# Patient Record
Sex: Male | Born: 1955 | Race: White | Hispanic: No | Marital: Married | Smoking: Never smoker
Health system: Southern US, Community
[De-identification: ages and names within clinical notes are randomized; demographics above are authoritative.]

## PROBLEM LIST (undated history)

## (undated) DIAGNOSIS — C801 Malignant (primary) neoplasm, unspecified: Secondary | ICD-10-CM

## (undated) DIAGNOSIS — Z87442 Personal history of urinary calculi: Secondary | ICD-10-CM

## (undated) DIAGNOSIS — I2510 Atherosclerotic heart disease of native coronary artery without angina pectoris: Secondary | ICD-10-CM

## (undated) DIAGNOSIS — E785 Hyperlipidemia, unspecified: Secondary | ICD-10-CM

## (undated) DIAGNOSIS — I10 Essential (primary) hypertension: Secondary | ICD-10-CM

## (undated) DIAGNOSIS — C61 Malignant neoplasm of prostate: Secondary | ICD-10-CM

## (undated) HISTORY — DX: Malignant (primary) neoplasm, unspecified: C80.1

## (undated) HISTORY — DX: Atherosclerotic heart disease of native coronary artery without angina pectoris: I25.10

## (undated) HISTORY — PX: COLONOSCOPY: SHX174

## (undated) HISTORY — DX: Hyperlipidemia, unspecified: E78.5

## (undated) HISTORY — PX: PROSTATE BIOPSY: SHX241

## (undated) HISTORY — DX: Personal history of urinary calculi: Z87.442

## (undated) HISTORY — PX: OTHER SURGICAL HISTORY: SHX169

---

## 2007-01-07 ENCOUNTER — Encounter: Payer: Self-pay | Admitting: Internal Medicine

## 2008-02-21 ENCOUNTER — Ambulatory Visit: Payer: Self-pay | Admitting: Internal Medicine

## 2008-02-21 LAB — CONVERTED CEMR LAB
ALT: 42 units/L (ref 0–53)
AST: 25 units/L (ref 0–37)
Albumin: 3.9 g/dL (ref 3.5–5.2)
Alkaline Phosphatase: 40 units/L (ref 39–117)
BUN: 14 mg/dL (ref 6–23)
Basophils Absolute: 0 10*3/uL (ref 0.0–0.1)
Bilirubin Urine: NEGATIVE
Bilirubin, Direct: 0.1 mg/dL (ref 0.0–0.3)
Blood in Urine, dipstick: NEGATIVE
Calcium: 9.7 mg/dL (ref 8.4–10.5)
Chloride: 107 meq/L (ref 96–112)
Cholesterol: 221 mg/dL (ref 0–200)
Eosinophils Absolute: 0 10*3/uL (ref 0.0–0.6)
Eosinophils Relative: 1.1 % (ref 0.0–5.0)
GFR calc Af Amer: 101 mL/min
GFR calc non Af Amer: 84 mL/min
Glucose, Bld: 103 mg/dL — ABNORMAL HIGH (ref 70–99)
HDL: 42.2 mg/dL (ref >39.0)
Ketones, urine, test strip: NEGATIVE
Lymphocytes Relative: 24.3 % (ref 12.0–46.0)
MCHC: 33.9 g/dL (ref 30.0–36.0)
MCV: 91.7 fL (ref 78.0–100.0)
Monocytes Relative: 7.3 % (ref 3.0–11.0)
Neutro Abs: 3 10*3/uL (ref 1.4–7.7)
Nitrite: NEGATIVE
Platelets: 198 10*3/uL (ref 150–400)
RBC: 4.7 M/uL (ref 4.22–5.81)
Specific Gravity, Urine: 1.02
TSH: 2.52 microintl units/mL (ref 0.35–5.50)
Triglycerides: 59 mg/dL (ref 0–149)
Urobilinogen, UA: 0.2
WBC: 4.3 10*3/uL — ABNORMAL LOW (ref 4.5–10.5)

## 2008-02-28 ENCOUNTER — Ambulatory Visit: Payer: Self-pay | Admitting: Internal Medicine

## 2008-02-28 DIAGNOSIS — Z87442 Personal history of urinary calculi: Secondary | ICD-10-CM | POA: Insufficient documentation

## 2008-02-28 DIAGNOSIS — E785 Hyperlipidemia, unspecified: Secondary | ICD-10-CM | POA: Insufficient documentation

## 2008-02-28 HISTORY — DX: Personal history of urinary calculi: Z87.442

## 2008-03-08 ENCOUNTER — Ambulatory Visit: Payer: Self-pay | Admitting: Gastroenterology

## 2008-03-11 ENCOUNTER — Encounter: Payer: Self-pay | Admitting: Internal Medicine

## 2008-03-22 ENCOUNTER — Encounter: Payer: Self-pay | Admitting: Gastroenterology

## 2008-03-22 ENCOUNTER — Ambulatory Visit: Payer: Self-pay | Admitting: Gastroenterology

## 2008-03-22 ENCOUNTER — Encounter: Payer: Self-pay | Admitting: Internal Medicine

## 2008-03-28 LAB — HM COLONOSCOPY

## 2008-05-22 ENCOUNTER — Ambulatory Visit: Payer: Self-pay | Admitting: Internal Medicine

## 2008-05-22 LAB — CONVERTED CEMR LAB
Albumin: 3.8 g/dL (ref 3.5–5.2)
Alkaline Phosphatase: 47 units/L (ref 39–117)
Cholesterol: 202 mg/dL (ref 0–200)
Direct LDL: 147.1 mg/dL
Total CHOL/HDL Ratio: 5.4
Total Protein: 6.4 g/dL (ref 6.0–8.3)
Triglycerides: 60 mg/dL (ref 0–149)
VLDL: 12 mg/dL (ref 0–40)

## 2008-06-04 ENCOUNTER — Telehealth: Payer: Self-pay | Admitting: Internal Medicine

## 2008-06-24 ENCOUNTER — Ambulatory Visit: Payer: Self-pay | Admitting: Internal Medicine

## 2008-06-24 LAB — CONVERTED CEMR LAB: LDL Goal: 130 mg/dL

## 2008-09-27 ENCOUNTER — Ambulatory Visit: Payer: Self-pay | Admitting: Internal Medicine

## 2008-09-27 LAB — CONVERTED CEMR LAB
AST: 30 units/L (ref 0–37)
Bilirubin, Direct: 0.2 mg/dL (ref 0.0–0.3)
HDL: 39 mg/dL (ref >39.0)
Total Bilirubin: 0.8 mg/dL (ref 0.3–1.2)
Total CHOL/HDL Ratio: 4.4
VLDL: 10 mg/dL (ref 0–40)

## 2008-10-08 ENCOUNTER — Ambulatory Visit: Payer: Self-pay | Admitting: Internal Medicine

## 2009-02-18 ENCOUNTER — Ambulatory Visit: Payer: Self-pay | Admitting: Internal Medicine

## 2009-02-18 LAB — CONVERTED CEMR LAB
ALT: 30 units/L (ref 0–53)
Alkaline Phosphatase: 47 units/L (ref 39–117)
Basophils Absolute: 0 10*3/uL (ref 0.0–0.1)
Bilirubin Urine: NEGATIVE
Bilirubin, Direct: 0.2 mg/dL (ref 0.0–0.3)
Blood in Urine, dipstick: NEGATIVE
CO2: 30 meq/L (ref 19–32)
Calcium: 9.2 mg/dL (ref 8.4–10.5)
Chloride: 104 meq/L (ref 96–112)
Cholesterol: 218 mg/dL (ref 0–200)
Glucose, Urine, Semiquant: NEGATIVE
HDL: 41.2 mg/dL (ref >39.0)
Lymphocytes Relative: 28.2 % (ref 12.0–46.0)
MCHC: 35.3 g/dL (ref 30.0–36.0)
Neutro Abs: 2.7 10*3/uL (ref 1.4–7.7)
Neutrophils Relative %: 62.1 % (ref 43.0–77.0)
PSA: 1.59 ng/mL (ref 0.10–4.00)
Platelets: 185 10*3/uL (ref 150–400)
Potassium: 4.6 meq/L (ref 3.5–5.1)
RDW: 12.1 % (ref 11.5–14.6)
Sodium: 141 meq/L (ref 135–145)
Total Bilirubin: 1.2 mg/dL (ref 0.3–1.2)
Urobilinogen, UA: 0.2
VLDL: 19 mg/dL (ref 0–40)
pH: 7

## 2009-03-04 ENCOUNTER — Ambulatory Visit: Payer: Self-pay | Admitting: Internal Medicine

## 2009-07-22 ENCOUNTER — Ambulatory Visit: Payer: Self-pay | Admitting: Internal Medicine

## 2009-07-22 LAB — CONVERTED CEMR LAB
ALT: 28 units/L (ref 0–53)
Direct LDL: 161.9 mg/dL
HDL: 45.3 mg/dL (ref >39.00)
Total Bilirubin: 1.2 mg/dL (ref 0.3–1.2)
Total Protein: 6.7 g/dL (ref 6.0–8.3)
Triglycerides: 111 mg/dL (ref 0.0–149.0)

## 2009-08-05 ENCOUNTER — Ambulatory Visit: Payer: Self-pay | Admitting: Internal Medicine

## 2009-09-17 ENCOUNTER — Ambulatory Visit: Payer: Self-pay | Admitting: Internal Medicine

## 2009-09-17 LAB — CONVERTED CEMR LAB
ALT: 32 units/L (ref 0–53)
Alkaline Phosphatase: 53 units/L (ref 39–117)
Bilirubin, Direct: 0 mg/dL (ref 0.0–0.3)
Cholesterol: 195 mg/dL (ref 0–200)
Total Protein: 6.7 g/dL (ref 6.0–8.3)
VLDL: 14.8 mg/dL (ref 0.0–40.0)

## 2009-09-23 ENCOUNTER — Ambulatory Visit: Payer: Self-pay | Admitting: Internal Medicine

## 2009-09-23 DIAGNOSIS — M25519 Pain in unspecified shoulder: Secondary | ICD-10-CM

## 2009-09-23 DIAGNOSIS — J0110 Acute frontal sinusitis, unspecified: Secondary | ICD-10-CM

## 2010-02-24 ENCOUNTER — Ambulatory Visit: Payer: Self-pay | Admitting: Internal Medicine

## 2010-02-24 LAB — CONVERTED CEMR LAB
ALT: 33 units/L (ref 0–53)
AST: 24 units/L (ref 0–37)
Basophils Relative: 0.8 % (ref 0.0–3.0)
Bilirubin Urine: NEGATIVE
Bilirubin, Direct: 0.2 mg/dL (ref 0.0–0.3)
Blood in Urine, dipstick: NEGATIVE
Chloride: 107 meq/L (ref 96–112)
Eosinophils Relative: 0.9 % (ref 0.0–5.0)
Glucose, Urine, Semiquant: NEGATIVE
HCT: 44.8 % (ref 39.0–52.0)
Ketones, urine, test strip: NEGATIVE
LDL Cholesterol: 117 mg/dL — ABNORMAL HIGH (ref 0–99)
MCV: 95 fL (ref 78.0–100.0)
Monocytes Absolute: 0.3 10*3/uL (ref 0.1–1.0)
Monocytes Relative: 7.2 % (ref 3.0–12.0)
Neutrophils Relative %: 69 % (ref 43.0–77.0)
Nitrite: NEGATIVE
PSA: 1.79 ng/mL (ref 0.10–4.00)
Potassium: 4.9 meq/L (ref 3.5–5.1)
Protein, U semiquant: NEGATIVE
RBC: 4.71 M/uL (ref 4.22–5.81)
Specific Gravity, Urine: 1.015
Total CHOL/HDL Ratio: 4
Total Protein: 7 g/dL (ref 6.0–8.3)
Urobilinogen, UA: 0.2
WBC Urine, dipstick: NEGATIVE
WBC: 4.1 10*3/uL — ABNORMAL LOW (ref 4.5–10.5)
pH: 7

## 2010-03-06 ENCOUNTER — Ambulatory Visit: Payer: Self-pay | Admitting: Internal Medicine

## 2010-03-06 LAB — CONVERTED CEMR LAB: LDL Goal: 100 mg/dL

## 2010-09-01 ENCOUNTER — Ambulatory Visit: Payer: Self-pay | Admitting: Internal Medicine

## 2010-09-01 LAB — CONVERTED CEMR LAB
ALT: 26 units/L (ref 0–53)
Alkaline Phosphatase: 51 units/L (ref 39–117)
Bilirubin, Direct: 0.1 mg/dL (ref 0.0–0.3)
Cholesterol: 193 mg/dL (ref 0–200)
Total Bilirubin: 0.7 mg/dL (ref 0.3–1.2)
Total Protein: 6.2 g/dL (ref 6.0–8.3)

## 2010-09-08 ENCOUNTER — Ambulatory Visit: Payer: Self-pay | Admitting: Internal Medicine

## 2010-12-03 ENCOUNTER — Ambulatory Visit: Payer: Self-pay | Admitting: Internal Medicine

## 2010-12-03 LAB — CONVERTED CEMR LAB
AST: 24 units/L (ref 0–37)
Albumin: 3.9 g/dL (ref 3.5–5.2)
Alkaline Phosphatase: 52 units/L (ref 39–117)
Cholesterol: 175 mg/dL (ref 0–200)
HDL: 38.7 mg/dL — ABNORMAL LOW (ref >39.00)
LDL Cholesterol: 115 mg/dL — ABNORMAL HIGH (ref 0–99)
Total CHOL/HDL Ratio: 5
Total Protein: 6.4 g/dL (ref 6.0–8.3)
Triglycerides: 106 mg/dL (ref 0.0–149.0)

## 2010-12-10 ENCOUNTER — Ambulatory Visit: Payer: Self-pay | Admitting: Internal Medicine

## 2011-01-19 NOTE — Assessment & Plan Note (Signed)
Summary: 6 month rov/njr/pt rsc from bmp/cjr   Vital Signs:  Patient profile:   55 year old male Height:      68 inches Weight:      178 pounds BMI:     27.16 Temp:     98.2 degrees F oral Pulse rate:   72 / minute Resp:     14 per minute BP sitting:   120 / 78  (left arm)  Vitals Entered By: Willy Eddy, LPN (September 08, 2010 9:29 AM) CC: roa labs, Lipid Management Is Patient Diabetic? No   Primary Care Provider:  Stacie Glaze MD  CC:  roa labs and Lipid Management.  History of Present Illness: weight loss noted  Hyperlipidemia Follow-Up      This is a 55 year old man who presents for Hyperlipidemia follow-up.  The patient denies muscle aches, GI upset, abdominal pain, flushing, itching, constipation, diarrhea, and fatigue.  The patient denies the following symptoms: chest pain/pressure, exercise intolerance, dypsnea, palpitations, syncope, and pedal edema.  Compliance with medications (by patient report) has been near 100%.  Dietary compliance has been excellent.  The patient reports exercising daily.  Adjunctive measures currently used by the patient include fish oil supplements.    Lipid Management History:      Positive NCEP/ATP III risk factors include male age 44 years old or older.  Negative NCEP/ATP III risk factors include no family history for ischemic heart disease and non-tobacco-user status.    Preventive Screening-Counseling & Management  Alcohol-Tobacco     Smoking Status: never     Passive Smoke Exposure: no  Problems Prior to Update: 1)  Acute Frontal Sinusitis  (ICD-461.1) 2)  Pain in Joint, Shoulder Region  (ICD-719.41) 3)  ? of Cad  (ICD-414.00) 4)  Neoplasm, Malignant, Prostate, Family Hx  (ICD-V16.42) 5)  Hyperlipidemia  (ICD-272.4) 6)  Health Maintenance Exam  (ICD-V70.0) 7)  Nephrolithiasis, Hx of  (ICD-V13.01)  Current Problems (verified): 1)  Acute Frontal Sinusitis  (ICD-461.1) 2)  Pain in Joint, Shoulder Region  (ICD-719.41) 3)   ? of Cad  (ICD-414.00) 4)  Neoplasm, Malignant, Prostate, Family Hx  (ICD-V16.42) 5)  Hyperlipidemia  (ICD-272.4) 6)  Health Maintenance Exam  (ICD-V70.0) 7)  Nephrolithiasis, Hx of  (ICD-V13.01)  Medications Prior to Update: 1)  Adult Aspirin Ec Low Strength 81 Mg  Tbec (Aspirin) .... Once Daily Several Times A Week 2)  Flaxseed Oil 1000 Mg  Caps (Flaxseed (Linseed)) .... 2 Once Daily 3)  Crestor 20 Mg Tabs (Rosuvastatin Calcium) .... One By Mouth  Every Sunday Night  Current Medications (verified): 1)  Crestor 20 Mg Tabs (Rosuvastatin Calcium) .... 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  Past Medical History: Reviewed history from 02/28/2008 and no changes required. Unremarkable Nephrolithiasis, hx of Hyperlipidemia  Past Surgical History: Reviewed history from 02/28/2008 and no changes required. Denies surgical history  Family History: Reviewed history from 02/28/2008  and no changes required. Father: living 8 years with heart disease Mother: living 79 living with dm Siblings: 1 brother and 1 sister both living--sister is healthy and brother has heart disease and hx of prostate ca  Social History: Reviewed history from 02/28/2008 and no changes required. 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         Flu Vaccine Consent Questions     Do you have a history of severe allergic reactions to this vaccine? no    Any prior history of allergic reactions to egg and/or gelatin? no    Do you have a sensitivity to the preservative Thimersol? no    Do you have a past history of Guillan-Barre Syndrome? no    Do you currently have an acute febrile illness? no    Have you ever had a severe reaction to latex? no    Vaccine information given and explained to patient? yes    Are you currently pregnant? no    Lot Number:[id number]   Exp Date:06/19/2011   Site Given  Left Deltoid IM   Physical Exam  General:  Well-developed,well-nourished,in no acute distress; alert,appropriate and cooperative throughout examination Head:  Normocephalic and atraumatic without obvious abnormalities. No apparent alopecia or balding. Ears:  R ear normal, L ear normal, and no external deformities.   Nose:  no external deformity and no nasal discharge.   Mouth:  Oral mucosa and oropharynx without lesions or exudates.  Teeth in good repair. Neck:  No deformities, masses, or tenderness noted. Lungs:  normal respiratory effort and no wheezes.   Heart:  normal rate and regular rhythm.     Impression & Recommendations:  Problem # 1:  HYPERLIPIDEMIA (ICD-272.4) Assessment Improved will increased the crestor and moniter in 3 month I have spent greater that 30  min face to face evaluating this patient  His updated medication list for this problem includes:    Crestor 20 Mg Tabs (Rosuvastatin calcium) ..... One by mouth sunday and wednesday  Labs Reviewed: SGOT: 24 (09/01/2010)   SGPT: 26 (09/01/2010)  Lipid Goals: Chol Goal: 200 (06/24/2008)   HDL Goal: 40 (06/24/2008)   LDL Goal: 100 (03/06/2010)   TG Goal: 150 (06/24/2008)  10 Yr Risk Heart Disease: 6 % Prior 10 Yr Risk Heart Disease: 7 % (10/08/2008)   HDL:45.30 (09/01/2010), 49.30 (02/24/2010)  LDL:127 (09/01/2010), 117 (02/24/2010)  Chol:193 (09/01/2010), 177 (02/24/2010)  Trig:103.0 (09/01/2010), 55.0 (02/24/2010)  Complete Medication List: 1)  Crestor 20 Mg Tabs (Rosuvastatin calcium) .... One by mouth sunday and wednesday  Other Orders: Admin 1st Vaccine (04540) Flu Vaccine 40yrs + (98119)  Lipid Assessment/Plan:      Based on NCEP/ATP III, the patient's risk factor category is "0-1 risk factors".  The patient's lipid goals are as follows: Total cholesterol goal is 200; LDL cholesterol goal is 100; HDL cholesterol goal is 40; Triglyceride goal is 150.  His LDL cholesterol goal has been met.     Patient Instructions: 1)  3 month  2)  Hepatic Panel prior to visit, ICD-9:995.20 3)  Lipid Panel prior to visit, ICD-9:272.4 Prescriptions:  CRESTOR 20 MG TABS (ROSUVASTATIN CALCIUM) one by mouth daily  #30 x 0   Entered and Authorized by:   Stacie Glaze MD   Signed by:   Stacie Glaze MD on 09/08/2010   Method used:   Print then Give to Patient   RxID:   [id number]

## 2011-01-19 NOTE — Assessment & Plan Note (Signed)
Summary: cpx/njr...Patrick KitchenPT RSC-BUMPED // RS   Vital Signs:  Patient profile:   55 year old male Height:      68 inches Weight:      181 pounds BMI:     27.62 Temp:     98.2 degrees F oral Pulse rate:   72 / minute Resp:     14 per minute BP sitting:   132 / 84  (left arm)  Vitals Entered By: Willy Eddy, LPN (March 06, 2010 8:52 AM) CC: cpx, Lipid Management   CC:  cpx and Lipid Management.  History of Present Illness: The pt was asked about all immunizations, health maint. services that are appropriate to their age and was given guidance on diet exercize  and weight management   Lipid Management History:      Positive NCEP/ATP III risk factors include male age 56 years old or older.  Negative NCEP/ATP III risk factors include no family history for ischemic heart disease and non-tobacco-user status.     Preventive Screening-Counseling & Management  Alcohol-Tobacco     Smoking Status: never     Passive Smoke Exposure: no  Problems Prior to Update: 1)  Acute Frontal Sinusitis  (ICD-461.1) 2)  Pain in Joint, Shoulder Region  (ICD-719.41) 3)  ? of Cad  (ICD-414.00) 4)  Neoplasm, Malignant, Prostate, Family Hx  (ICD-V16.42) 5)  Hyperlipidemia  (ICD-272.4) 6)  Health Maintenance Exam  (ICD-V70.0) 7)  Nephrolithiasis, Hx of  (ICD-V13.01)  Current Problems (verified): 1)  Acute Frontal Sinusitis  (ICD-461.1) 2)  Pain in Joint, Shoulder Region  (ICD-719.41) 3)  ? of Cad  (ICD-414.00) 4)  Neoplasm, Malignant, Prostate, Family Hx  (ICD-V16.42) 5)  Hyperlipidemia  (ICD-272.4) 6)  Health Maintenance Exam  (ICD-V70.0) 7)  Nephrolithiasis, Hx of  (ICD-V13.01)  Medications Prior to Update: 1)  Adult Aspirin Ec Low Strength 81 Mg  Tbec (Aspirin) .... Once Daily 2)  Mens Life-Pack   Tabs (Multiple Vitamins-Minerals) .... Once Daily 3)  Flaxseed Oil 1000 Mg  Caps (Flaxseed (Linseed)) .... 2 Once Daily 4)  Crestor 20 Mg Tabs (Rosuvastatin Calcium) .... One By Mouth  Every Sunday  Night 5)  Azithromycin 250 Mg Tabs (Azithromycin) .... Two By Mouth Now and Then One A Day For  4 Days 6)  Fexofenadine-Pseudoephedrine 60-120 Mg Xr12h-Tab (Fexofenadine-Pseudoephedrine) .... One By Mouth Bid  Current Medications (verified): 1)  Adult Aspirin Ec Low Strength 81 Mg  Tbec (Aspirin) .... Once Daily Several Times A Week 2)  Flaxseed Oil 1000 Mg  Caps (Flaxseed (Linseed)) .... 2 Once Daily 3)  Crestor 20 Mg Tabs (Rosuvastatin Calcium) .... 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  Past Medical History: Reviewed history from 02/28/2008 and no changes required. Unremarkable Nephrolithiasis, hx of Hyperlipidemia  Past Surgical History: Reviewed history from 02/28/2008 and no changes required. Denies surgical history  Family History: Reviewed history from 02/28/2008 and no changes required. Father: living 21  years with heart disease Mother: living 53 living with dm Siblings: 1 brother and 1 sister both living--sister is healthy and brother has heart disease and hx of prostate ca  Social History: Reviewed history from 02/28/2008 and no changes required. 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    Physical Exam  General:  Well-developed,well-nourished,in no acute distress; alert,appropriate and cooperative throughout examination Head:  Normocephalic and atraumatic without obvious abnormalities. No apparent alopecia or balding. Eyes:  pupils equal and pupils round.   Ears:  R ear normal, L ear normal, and no external deformities.   Nose:  no external deformity and no nasal discharge.   Neck:  No deformities, masses, or tenderness noted. Chest Wall:  no deformities and no tenderness.   Breasts:  no gynecomastia and no masses.   Lungs:  normal respiratory effort and no wheezes.   Heart:  normal rate and regular rhythm.   Abdomen:  Bowel sounds positive,abdomen soft and non-tender without masses, organomegaly or hernias noted. Rectal:  no external abnormalities and normal sphincter tone.   Genitalia:  circumcised and no urethral discharge.   Prostate:  no nodules, no asymmetry, and 1+ enlarged.   Msk:  no joint tenderness and no joint swelling.   Pulses:  R and L carotid,radial,femoral,dorsalis pedis and posterior tibial pulses are full and equal bilaterally Extremities:  No clubbing, cyanosis, edema, or deformity noted with normal full range of motion of all joints.   Neurologic:  No cranial nerve deficits noted. Station and gait are normal. Plantar reflexes are down-going bilaterally. DTRs are symmetrical throughout. Sensory, motor and coordinative functions appear intact. Cervical Nodes:  No lymphadenopathy  noted Axillary Nodes:  No palpable lymphadenopathy Psych:  Oriented X3 and not anxious appearing.     Impression & Recommendations:  Problem # 1:  HEALTH MAINTENANCE EXAM (ICD-V70.0) The pt was asked about all immunizations, health maint. services that are appropriate to their age and was given guidance on diet exercize  and weight management  Colonoscopy: SMALL EXTERNAL HEMORRHOIDS AND A SINGLE POLYP.  NO CANCERS.  IF THE POLYP IS A PRE-CANCEROUS POLYP (ADENOMA), A COLONOSCOPY SHOULD BE REPEATED IN 5 YEARS.  OTHERWISE THE PATIENT SHOULD CONTINUE TO FOLLOW CURRENT COLORECTAL CANCER SCREENING GUIDELINES WITH A REPEAT COLONOSCOPY IN 10 YEARS. (03/22/2008) Td Booster: Td (02/17/2005)   Flu Vax: Fluvax 3+ (09/23/2009)   Chol: 177 (02/24/2010)   HDL: 49.30 (02/24/2010)   LDL: 117 (02/24/2010)   TG: 55.0 (02/24/2010) TSH: 2.54 (02/24/2010)   PSA: 1.79 (02/24/2010)  Discussed using sunscreen, use of alcohol, drug use, self testicular exam, routine dental care, routine eye care, routine physical exam, seat belts, multiple vitamins, osteoporosis prevention, adequate calcium intake in diet, and recommendations for immunizations.  Discussed exercise and checking cholesterol.  Discussed gun safety, safe sex, and contraception. Also recommend checking PSA.  Complete Medication List: 1)  Adult Aspirin Ec Low Strength 81 Mg Tbec (Aspirin) .... Once daily several times a week 2)  Flaxseed Oil 1000 Mg Caps (Flaxseed (linseed)) .... 2 once daily 3)  Crestor 20 Mg Tabs (Rosuvastatin calcium) .... One  by mouth  every sunday night  Lipid Assessment/Plan:      Based on NCEP/ATP III, the patient's risk factor category is "0-1 risk factors".  The patient's lipid goals are as follows: Total cholesterol goal is 200; LDL cholesterol goal is 100; HDL cholesterol goal is 40; Triglyceride goal is 150.  His LDL cholesterol goal has been met.    Patient Instructions: 1)  Please schedule a follow-up appointment in 6  months. 2)  Hepatic Panel prior to visit, ICD-9:995.20 3)  Lipid Panel prior to visit, ICD-9:272.4

## 2011-01-21 NOTE — Assessment & Plan Note (Signed)
Summary: 3 month ov///ccm   Vital Signs:  Patient profile:   55 year old male Height:      68 inches Weight:      178 pounds BMI:     27.16 Temp:     98.2 degrees F oral Pulse rate:   68 / minute Resp:     14 per minute BP sitting:   136 / 82  (left arm)  Vitals Entered By: Willy Eddy, LPN (December 10, 2010 8:58 AM) CC: roa labs, Lipid Management Is Patient Diabetic? No   Primary Care Elleah Hemsley:  Stacie Glaze MD  CC:  roa labs and Lipid Management.  History of Present Illness:  Hyperlipidemia Follow-Up      This is a 55 year old man who presents for Hyperlipidemia follow-up.  The patient denies muscle aches, GI upset, abdominal pain, flushing, itching, constipation, diarrhea, and fatigue.  The patient denies the following symptoms: chest pain/pressure, exercise intolerance, dypsnea, palpitations, syncope, and pedal edema.  Compliance with medications (by patient report) has been near 100%.  Dietary compliance has been excellent.  The patient reports exercising 3-4X per week.  Adjunctive measures currently used by the patient include weight reduction.    Lipid Management History:      Positive NCEP/ATP III risk factors include male age 61 years old or older and HDL cholesterol less than 40.  Negative NCEP/ATP III risk factors include no family history for ischemic heart disease and non-tobacco-user status.     Preventive Screening-Counseling & Management  Alcohol-Tobacco     Smoking Status: never     Passive Smoke Exposure: no  Problems Prior to Update: 1)  Acute Frontal Sinusitis  (ICD-461.1) 2)  Pain in Joint, Shoulder Region  (ICD-719.41) 3)  ? of Cad  (ICD-414.00) 4)  Neoplasm, Malignant, Prostate, Family Hx  (ICD-V16.42) 5)  Hyperlipidemia  (ICD-272.4) 6)  Health Maintenance Exam  (ICD-V70.0) 7)  Nephrolithiasis, Hx of  (ICD-V13.01)  Medications Prior to Update: 1)  Crestor 20 Mg Tabs (Rosuvastatin Calcium) .... One By Mouth Sunday and Wednesday  Current  Medications (verified): 1)  Crestor 20 Mg Tabs (Rosuvastatin Calcium) .... One By Mouth Sunday and Wednesday 2)  Aspirin 81 Mg Tbec (Aspirin) .... 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  Past Medical History: Reviewed history from 02/28/2008 and no changes required. Unremarkable Nephrolithiasis, hx of Hyperlipidemia  Past Surgical History: Reviewed history from 02/28/2008 and no changes required. Denies surgical history  Family History: Reviewed history from 02/28/2008 and no changes required. Father: living 70 years with heart disease Mother: living 23 living with dm Siblings: 1 brother and 1 sister both living--sister is healthy and brother has heart disease and hx of prostate ca  Social History: Reviewed history from 02/28/2008 and no changes required. 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    Physical Exam  General:  Well-developed,well-nourished,in no acute distress; alert,appropriate and cooperative throughout examination Head:  Normocephalic and atraumatic without obvious abnormalities. No apparent alopecia or balding. Eyes:  pupils equal and pupils round.   Ears:  R ear normal, L ear normal, and no external deformities.   Nose:  no external deformity and no nasal discharge.   Mouth:  Oral mucosa and oropharynx without lesions or exudates.  Teeth in good repair. Neck:  No deformities, masses, or tenderness noted. Lungs:  normal respiratory effort and no wheezes.   Heart:  normal rate and regular rhythm.     Impression & Recommendations:  Problem # 1:  HYPERLIPIDEMIA (ICD-272.4) Assessment Improved  His updated medication list for this problem includes:    Crestor 20 Mg Tabs (Rosuvastatin calcium) ..... One by mouth sunday and wednesday  Labs Reviewed: SGOT: 24 (12/03/2010)   SGPT: 33 (12/03/2010)  Lipid Goals: Chol Goal: 200 (06/24/2008)   HDL Goal: 40 (06/24/2008)   LDL Goal: 100 (03/06/2010)   TG Goal: 150 (06/24/2008)  10 Yr Risk Heart Disease: 9 % Prior 10 Yr Risk Heart Disease: 6 % (09/08/2010)   HDL:38.70 (12/03/2010), 45.30 (09/01/2010)  LDL:115 (12/03/2010), 127 (09/01/2010)  Chol:175 (12/03/2010), 193 (09/01/2010)  Trig:106.0 (12/03/2010), 103.0 (09/01/2010)  Complete Medication List: 1)  Crestor 20 Mg Tabs (Rosuvastatin calcium) .... One by mouth sunday and wednesday 2)  Aspirin 81 Mg Tbec (Aspirin) .... 2 once daily  Lipid Assessment/Plan:      Based on NCEP/ATP III, the patient's risk factor category is "2 or more risk factors and a calculated 10 year CAD risk of < 20%".  The patient's lipid goals are as follows: Total cholesterol goal is 200; LDL cholesterol goal is 100; HDL cholesterol goal is 40;  Triglyceride goal is 150.  His LDL cholesterol goal has been met.    Patient Instructions: 1)  april  early AM cpx   Orders Added: 1)  Est. Patient Level III [16109]

## 2011-04-08 ENCOUNTER — Encounter: Payer: Self-pay | Admitting: Internal Medicine

## 2011-04-08 ENCOUNTER — Other Ambulatory Visit (INDEPENDENT_AMBULATORY_CARE_PROVIDER_SITE_OTHER): Payer: BLUE CROSS/BLUE SHIELD | Admitting: Internal Medicine

## 2011-04-08 DIAGNOSIS — Z0000 Encounter for general adult medical examination without abnormal findings: Secondary | ICD-10-CM

## 2011-04-08 LAB — HEPATIC FUNCTION PANEL
ALT: 28 U/L (ref 0–53)
AST: 20 U/L (ref 0–37)
Albumin: 4 g/dL (ref 3.5–5.2)
Alkaline Phosphatase: 52 U/L (ref 39–117)
Bilirubin, Direct: 0.1 mg/dL (ref 0.0–0.3)
Total Protein: 6.6 g/dL (ref 6.0–8.3)

## 2011-04-08 LAB — POCT URINALYSIS DIPSTICK
Bilirubin, UA: NEGATIVE
Glucose, UA: NEGATIVE
Ketones, UA: NEGATIVE
Leukocytes, UA: NEGATIVE
Nitrite, UA: NEGATIVE
pH, UA: 7

## 2011-04-08 LAB — CBC WITH DIFFERENTIAL/PLATELET
Basophils Relative: 0.4 % (ref 0.0–3.0)
Eosinophils Relative: 1.2 % (ref 0.0–5.0)
Hemoglobin: 15.5 g/dL (ref 13.0–17.0)
Lymphocytes Relative: 24.3 % (ref 12.0–46.0)
MCHC: 34.5 g/dL (ref 30.0–36.0)
Monocytes Relative: 9.2 % (ref 3.0–12.0)
Neutro Abs: 3.6 10*3/uL (ref 1.4–7.7)
Neutrophils Relative %: 64.9 % (ref 43.0–77.0)
RBC: 4.82 Mil/uL (ref 4.22–5.81)
WBC: 5.6 10*3/uL (ref 4.5–10.5)

## 2011-04-08 LAB — LIPID PANEL
HDL: 43.2 mg/dL (ref >39.00)
Total CHOL/HDL Ratio: 4

## 2011-04-08 LAB — PSA: PSA: 2.2 ng/mL (ref 0.10–4.00)

## 2011-04-08 LAB — BASIC METABOLIC PANEL
CO2: 27 mEq/L (ref 19–32)
Calcium: 9.3 mg/dL (ref 8.4–10.5)
Creatinine, Ser: 0.9 mg/dL (ref 0.4–1.5)
Sodium: 140 mEq/L (ref 135–145)

## 2011-04-19 ENCOUNTER — Encounter: Payer: Self-pay | Admitting: Internal Medicine

## 2011-04-19 ENCOUNTER — Ambulatory Visit (INDEPENDENT_AMBULATORY_CARE_PROVIDER_SITE_OTHER): Payer: BLUE CROSS/BLUE SHIELD | Admitting: Internal Medicine

## 2011-04-19 DIAGNOSIS — E785 Hyperlipidemia, unspecified: Secondary | ICD-10-CM

## 2011-04-19 DIAGNOSIS — Z0000 Encounter for general adult medical examination without abnormal findings: Secondary | ICD-10-CM

## 2011-04-19 NOTE — Progress Notes (Signed)
  Subjective:    Patient ID: Patrick [name], male    DOB: 28-Jul-1956, 55 y.o.   MRN: [id number]  HPI cpx   Review of Systems  Constitutional: Negative for fever and fatigue.  HENT: Negative for hearing loss, congestion, neck pain and postnasal drip.   Eyes: Negative for discharge, redness and visual disturbance.  Respiratory: Negative for cough, shortness of breath and wheezing.   Cardiovascular: Negative for leg swelling.  Gastrointestinal: Negative for abdominal pain, constipation and abdominal distention.  Genitourinary: Negative for urgency and frequency.  Musculoskeletal: Negative for joint swelling and arthralgias.  Skin: Negative for color change and rash.  Neurological: Negative for weakness and light-headedness.  Hematological: Negative for adenopathy.  Psychiatric/Behavioral: Negative for behavioral problems.       Objective:   Physical Exam  Constitutional: He is oriented to person, place, and time. He appears well-developed and well-nourished.  HENT:  Head: Normocephalic and atraumatic.  Eyes: Conjunctivae are normal. Pupils are equal, round, and reactive to light.  Neck: Normal range of motion. Neck supple.  Cardiovascular: Normal rate and regular rhythm.   Pulmonary/Chest: Effort normal and breath sounds normal.  Abdominal: Soft. Bowel sounds are normal.  Musculoskeletal: Normal range of motion.  Neurological: He is alert and oriented to person, place, and time.  Skin: Skin is warm and dry.  Psychiatric: He has a normal mood and affect. His behavior is normal.          Assessment & Plan:   Patient presents for yearly preventative medicine examination.   all immunizations and health maintenance protocols were reviewed with the patient and they are up to date with these protocols.   screening laboratory values were reviewed with the patient including screening of hyperlipidemia PSA renal function and hepatic function.   There medications past medical  history social history problem list and allergies were reviewed in detail.   Goals were established with regard to weight loss exercise diet in compliance with medications

## 2011-04-19 NOTE — Patient Instructions (Signed)
Consider a prostate health vitamin these are available at [facility] [place] these often contain saw palmetto zinc and sometimes trace minerals such as selinium

## 2011-06-30 ENCOUNTER — Ambulatory Visit (INDEPENDENT_AMBULATORY_CARE_PROVIDER_SITE_OTHER): Payer: BLUE CROSS/BLUE SHIELD | Admitting: Internal Medicine

## 2011-06-30 VITALS — BP 130/80 | HR 72 | Temp 98.2°F | Resp 16 | Ht 68.0 in | Wt 176.0 lb

## 2011-06-30 DIAGNOSIS — B351 Tinea unguium: Secondary | ICD-10-CM

## 2011-06-30 MED ORDER — TERBINAFINE HCL 250 MG PO TABS: 250.0000 mg | ORAL_TABLET | Freq: Every day | ORAL | Status: DC

## 2011-06-30 NOTE — Patient Instructions (Signed)
Mix one cup of white vinegar with 1 gallon of warm water and soak feet in this mixture for about 15 minutes 2-3 times a week

## 2011-07-23 ENCOUNTER — Encounter: Payer: Self-pay | Admitting: Internal Medicine

## 2011-07-23 NOTE — Progress Notes (Signed)
  Subjective:    Patient ID: Patrick [name], male    DOB: Apr 09, 1956, 55 y.o.   MRN: [id number]  HPI Patient is a 55 year old white male who presents for followup of hyperlipidemia.  He is on Crestor 20 mg by mouth and intermittent dosing schedule with excellent results tolerating medication well his acute complaint today of fungal toenails he states that he has involvement of both feet the Tinel's lifting in that they're painful.  He has no apparent history of liver disease or intolerance of antifungal medications we discussed the impact of the antifungal medications and the crust on the liver at the same time   Review of Systems  Constitutional: Negative for fever and fatigue.  HENT: Negative for hearing loss, congestion, neck pain and postnasal drip.   Eyes: Negative for discharge, redness and visual disturbance.  Respiratory: Negative for cough, shortness of breath and wheezing.   Cardiovascular: Negative for leg swelling.  Gastrointestinal: Negative for abdominal pain, constipation and abdominal distention.  Genitourinary: Negative for urgency and frequency.  Musculoskeletal: Negative for joint swelling and arthralgias.  Skin: Negative for color change and rash.  Neurological: Negative for weakness and light-headedness.  Hematological: Negative for adenopathy.  Psychiatric/Behavioral: Negative for behavioral problems.       Past Medical History  Diagnosis Date  . Nephrolithiasis   . Hyperlipidemia    No past surgical history on file.  reports that he has never smoked. He does not have any smokeless tobacco history on file. He reports that he drinks alcohol. He reports that he does not use illicit drugs. family history includes Diabetes in his mother; Heart disease in his brother and father; and Prostate cancer in his brother. No Known Allergies  Objective:   Physical Exam  Nursing note and vitals reviewed. Constitutional: He appears well-developed and well-nourished.    HENT:  Head: Normocephalic and atraumatic.  Eyes: Conjunctivae are normal. Pupils are equal, round, and reactive to light.  Neck: Normal range of motion. Neck supple.  Cardiovascular: Normal rate and regular rhythm.   Pulmonary/Chest: Effort normal and breath sounds normal.  Abdominal: Soft. Bowel sounds are normal.    Examination is toenails display fungal involvement of the great toenails bilaterally and 2 additional toenails on each foot he does not have apparent athlete's foot.        Assessment & Plan:  While on the antifungal medication we will hold his Crestor his lipids have been stable we discussed the risk benefits of this.  We will prescribe Lamisil 250 mg by mouth daily for 90 days with monitoring of liver functions at the end of the treatment.  Then he can resume the Crestor as before for treatment of his cholesterol.  We also talked about the endocrine water soaks 3 times a week to aid the medication of fungal.  The patient is aware of the benefits and side effects of this medication and agrees to this treatment plan

## 2011-07-27 ENCOUNTER — Telehealth: Payer: Self-pay | Admitting: Internal Medicine

## 2011-07-27 MED ORDER — CYCLOBENZAPRINE HCL 10 MG PO TABS: 10.0000 mg | ORAL_TABLET | Freq: Three times a day (TID) | ORAL | Status: DC | PRN

## 2011-07-27 NOTE — Telephone Encounter (Signed)
Patient's wife would like to know if you can call in a refill for Cyclobenzapr 10 mg.  Patient is leaving for a hiking trip and needs this medication for back pain.  Please call into [facility] [phone].  Thanks

## 2011-07-27 NOTE — Telephone Encounter (Signed)
Per dr Lovell Sheehan- may have flexeril 100 1 tid prn #20- 0 refill

## 2011-09-27 ENCOUNTER — Telehealth: Payer: Self-pay | Admitting: Internal Medicine

## 2011-09-27 ENCOUNTER — Other Ambulatory Visit: Payer: Self-pay | Admitting: Internal Medicine

## 2011-09-27 NOTE — Telephone Encounter (Signed)
Pt requesting refill on terbinafine (LAMISIL) 250 MG tablet  Please send to [facility]

## 2011-10-12 ENCOUNTER — Other Ambulatory Visit (INDEPENDENT_AMBULATORY_CARE_PROVIDER_SITE_OTHER): Payer: BC Managed Care – PPO

## 2011-10-12 DIAGNOSIS — E785 Hyperlipidemia, unspecified: Secondary | ICD-10-CM

## 2011-10-12 LAB — LIPID PANEL: VLDL: 18.8 mg/dL (ref 0.0–40.0)

## 2011-10-18 ENCOUNTER — Ambulatory Visit: Payer: BLUE CROSS/BLUE SHIELD | Admitting: Internal Medicine

## 2011-10-25 ENCOUNTER — Ambulatory Visit (INDEPENDENT_AMBULATORY_CARE_PROVIDER_SITE_OTHER): Payer: BC Managed Care – PPO | Admitting: Internal Medicine

## 2011-10-25 ENCOUNTER — Encounter: Payer: Self-pay | Admitting: Internal Medicine

## 2011-10-25 VITALS — BP 130/82 | HR 76 | Temp 98.2°F | Resp 16 | Ht 68.0 in | Wt 178.0 lb

## 2011-10-25 DIAGNOSIS — Z0000 Encounter for general adult medical examination without abnormal findings: Secondary | ICD-10-CM

## 2011-10-25 DIAGNOSIS — B351 Tinea unguium: Secondary | ICD-10-CM

## 2011-10-25 DIAGNOSIS — Z23 Encounter for immunization: Secondary | ICD-10-CM

## 2011-10-25 DIAGNOSIS — E785 Hyperlipidemia, unspecified: Secondary | ICD-10-CM

## 2011-10-25 DIAGNOSIS — M25519 Pain in unspecified shoulder: Secondary | ICD-10-CM

## 2011-10-25 NOTE — Patient Instructions (Addendum)
The patient is instructed to continue all medications as prescribed. Schedule followup with check out clerk upon leaving the clinic One cup of white vinegar in 1 galllon of warm water and soak for 15 min three time a week

## 2011-10-25 NOTE — Progress Notes (Signed)
  Subjective:    Patient ID: Patrick [name], male    DOB: 1956/11/08, 55 y.o.   MRN: [id number]  Hyperlipidemia This is a chronic problem. The current episode started more than 1 year ago. The problem is controlled. Recent lipid tests were reviewed and are variable. He has no history of chronic renal disease, diabetes, hypothyroidism, liver disease, obesity or nephrotic syndrome. Factors aggravating his hyperlipidemia include no known factors. Pertinent negatives include no shortness of breath. The current treatment provides moderate improvement of lipids. There are no compliance problems.  Risk factors for coronary artery disease include family history and male sex.   Review of toenail fungus     Review of Systems  Constitutional: Negative for fever and fatigue.  HENT: Negative for hearing loss, congestion, neck pain and postnasal drip.   Eyes: Negative for discharge, redness and visual disturbance.  Respiratory: Negative for cough, shortness of breath and wheezing.   Cardiovascular: Negative for leg swelling.  Gastrointestinal: Negative for abdominal pain, constipation and abdominal distention.  Genitourinary: Negative for urgency and frequency.  Musculoskeletal: Negative for joint swelling and arthralgias.  Skin: Negative for color change and rash.  Neurological: Negative for weakness and light-headedness.  Hematological: Negative for adenopathy.  Psychiatric/Behavioral: Negative for behavioral problems.   Past Medical History  Diagnosis Date  . Nephrolithiasis   . Hyperlipidemia    No past surgical history on file.  reports that he has never smoked. He does not have any smokeless tobacco history on file. He reports that he drinks alcohol. He reports that he does not use illicit drugs. family history includes Diabetes in his mother; Heart disease in his brother and father; and Prostate cancer in his brother. No Known Allergies     Objective:   Physical Exam  Vitals  reviewed. Constitutional: He appears well-developed and well-nourished.  HENT:  Head: Normocephalic and atraumatic.  Eyes: Conjunctivae are normal. Pupils are equal, round, and reactive to light.  Neck: Normal range of motion. Neck supple.  Cardiovascular: Normal rate and regular rhythm.   Pulmonary/Chest: Effort normal and breath sounds normal.  Abdominal: Soft. Bowel sounds are normal.          Assessment & Plan:  His cholesterol is at goal on current regimen you emphasize the need to continue with Crestor 20 mg by mouth twice weekly.  His fungal toenails appear to be improving significantly he has completed Lamisil course we recommend soaks with vinegar and water for the next few months to prevent recurrence.  Otherwise he is stable his sinusitis has resolved his shoulder joint pain has resolved.

## 2011-12-08 ENCOUNTER — Telehealth: Payer: Self-pay | Admitting: Internal Medicine

## 2011-12-08 MED ORDER — ROSUVASTATIN CALCIUM 20 MG PO TABS: 20.0000 mg | ORAL_TABLET | Freq: Every day | ORAL | Status: DC

## 2011-12-08 NOTE — Telephone Encounter (Signed)
Pt need sample of crestor 20 mg or rx call into [facility] [phone]

## 2011-12-08 NOTE — Telephone Encounter (Signed)
Left message on machine No samples available- called to pharmacy

## 2012-04-12 ENCOUNTER — Telehealth: Payer: Self-pay | Admitting: Internal Medicine

## 2012-04-12 NOTE — Telephone Encounter (Addendum)
Pt is coming in for blood work soon 04-21-2012. Pt wife is requesting lipoprotein profile. Pt wife is aware may not be covered by INS she said they will pay. Can I sch. Pt is on chole med.

## 2012-04-12 NOTE — Telephone Encounter (Signed)
Pt wife is aware

## 2012-04-12 NOTE — Telephone Encounter (Signed)
That is fine as long as they are aware insurance may not pay and it sounds like y ou took care of that

## 2012-04-13 ENCOUNTER — Telehealth: Payer: Self-pay | Admitting: Internal Medicine

## 2012-04-13 NOTE — Telephone Encounter (Signed)
Pt decided that he does not want to have Lipoprotein added to lab on 04/21/12. Pt said that he wants to discuss lipo protein with Dr Lovell Sheehan when pt comes in for ov on 05/01/12. Pt also said that he will ask about getting a stress test ordered.

## 2012-04-13 NOTE — Telephone Encounter (Signed)
Taken off of order

## 2012-04-21 ENCOUNTER — Other Ambulatory Visit (INDEPENDENT_AMBULATORY_CARE_PROVIDER_SITE_OTHER): Payer: BC Managed Care – PPO

## 2012-04-21 DIAGNOSIS — Z0000 Encounter for general adult medical examination without abnormal findings: Secondary | ICD-10-CM

## 2012-04-21 LAB — LIPID PANEL
LDL Cholesterol: 99 mg/dL (ref 0–99)
Total CHOL/HDL Ratio: 3
Triglycerides: 94 mg/dL (ref 0.0–149.0)

## 2012-04-21 LAB — CBC WITH DIFFERENTIAL/PLATELET
Basophils Absolute: 0 10*3/uL (ref 0.0–0.1)
Eosinophils Relative: 2.7 % (ref 0.0–5.0)
HCT: 43.2 % (ref 39.0–52.0)
Hemoglobin: 14.7 g/dL (ref 13.0–17.0)
Lymphocytes Relative: 20.6 % (ref 12.0–46.0)
Lymphs Abs: 1 10*3/uL (ref 0.7–4.0)
Monocytes Relative: 7.5 % (ref 3.0–12.0)
Platelets: 167 10*3/uL (ref 150.0–400.0)
RDW: 13.1 % (ref 11.5–14.6)
WBC: 4.9 10*3/uL (ref 4.5–10.5)

## 2012-04-21 LAB — HEPATIC FUNCTION PANEL
ALT: 28 U/L (ref 0–53)
Alkaline Phosphatase: 48 U/L (ref 39–117)
Bilirubin, Direct: 0.1 mg/dL (ref 0.0–0.3)
Total Bilirubin: 1.2 mg/dL (ref 0.3–1.2)
Total Protein: 6.4 g/dL (ref 6.0–8.3)

## 2012-04-21 LAB — BASIC METABOLIC PANEL
CO2: 28 mEq/L (ref 19–32)
Chloride: 103 mEq/L (ref 96–112)
Potassium: 4.9 mEq/L (ref 3.5–5.1)
Sodium: 140 mEq/L (ref 135–145)

## 2012-04-21 LAB — TSH: TSH: 2.42 u[IU]/mL (ref 0.35–5.50)

## 2012-04-24 ENCOUNTER — Other Ambulatory Visit: Payer: BC Managed Care – PPO

## 2012-05-01 ENCOUNTER — Encounter: Payer: Self-pay | Admitting: Internal Medicine

## 2012-05-01 ENCOUNTER — Ambulatory Visit: Payer: BC Managed Care – PPO | Admitting: Internal Medicine

## 2012-05-01 ENCOUNTER — Ambulatory Visit (INDEPENDENT_AMBULATORY_CARE_PROVIDER_SITE_OTHER): Payer: BC Managed Care – PPO | Admitting: Internal Medicine

## 2012-05-01 VITALS — BP 130/80 | HR 64 | Temp 98.3°F | Resp 16 | Ht 68.0 in | Wt 175.0 lb

## 2012-05-01 DIAGNOSIS — N400 Enlarged prostate without lower urinary tract symptoms: Secondary | ICD-10-CM

## 2012-05-01 DIAGNOSIS — E785 Hyperlipidemia, unspecified: Secondary | ICD-10-CM

## 2012-05-01 DIAGNOSIS — Z0000 Encounter for general adult medical examination without abnormal findings: Secondary | ICD-10-CM

## 2012-05-01 NOTE — Progress Notes (Signed)
  Subjective:    Patient ID: Patrick [name], male    DOB: 06-Aug-1956, 56 y.o.   MRN: [id number]  HPI  CPX Stable on medications No new complaints reveiwed all medications  Review of Systems  Constitutional: Negative for fever and fatigue.  HENT: Negative for hearing loss, congestion, neck pain and postnasal drip.   Eyes: Negative for discharge, redness and visual disturbance.  Respiratory: Negative for cough, shortness of breath and wheezing.   Cardiovascular: Negative for leg swelling.  Gastrointestinal: Negative for abdominal pain, constipation and abdominal distention.  Genitourinary: Negative for urgency and frequency.  Musculoskeletal: Negative for joint swelling and arthralgias.  Skin: Negative for color change and rash.  Neurological: Negative for weakness and light-headedness.  Hematological: Negative for adenopathy.  Psychiatric/Behavioral: Negative for behavioral problems.       Objective:   Physical Exam  Constitutional: He appears well-developed and well-nourished.  HENT:  Head: Normocephalic and atraumatic.  Eyes: Conjunctivae are normal. Pupils are equal, round, and reactive to light.  Neck: Normal range of motion. Neck supple.  Cardiovascular: Normal rate and regular rhythm.   Pulmonary/Chest: Effort normal and breath sounds normal.  Abdominal: Soft. Bowel sounds are normal.  Genitourinary: Rectum normal and prostate normal.  Musculoskeletal: Normal range of motion.  Skin: Skin is warm and dry.    Prostate normal in architecture but increased size Firm without masses      Assessment & Plan:   Patient presents for yearly preventative medicine examination.   all immunizations and health maintenance protocols were reviewed with the patient and they are up to date with these protocols.   screening laboratory values were reviewed with the patient including screening of hyperlipidemia PSA renal function and hepatic function.   There medications past medical  history social history problem list and allergies were reviewed in detail.   Goals were established with regard to weight loss exercise diet in compliance with medications

## 2012-05-01 NOTE — Patient Instructions (Signed)
The patient is instructed to continue all medications as prescribed. Schedule followup with check out clerk upon leaving the clinic  

## 2012-05-16 ENCOUNTER — Telehealth: Payer: Self-pay | Admitting: Internal Medicine

## 2012-05-16 MED ORDER — CYCLOBENZAPRINE HCL 10 MG PO TABS: 10.0000 mg | ORAL_TABLET | Freq: Three times a day (TID) | ORAL | Status: DC | PRN

## 2012-05-16 NOTE — Telephone Encounter (Signed)
Pt has thrown back out again and is currently using some muscle relaxer that pt was given but is almost out and requesting to have a refill on "whatever medication Dr. Lovell Sheehan feels he should use".   Please contact  [facility]

## 2012-05-16 NOTE — Telephone Encounter (Signed)
Flexeril called in- Left message on machine For pt

## 2012-10-02 ENCOUNTER — Ambulatory Visit (HOSPITAL_COMMUNITY)
Admission: RE | Admit: 2012-10-02 | Discharge: 2012-10-02 | Disposition: A | Discharge status: Home / Self Care | Payer: BC Managed Care – PPO | Source: Ambulatory Visit | Attending: Orthopedic Surgery | Admitting: Orthopedic Surgery

## 2012-10-02 ENCOUNTER — Other Ambulatory Visit (HOSPITAL_COMMUNITY): Payer: Self-pay | Admitting: Orthopedic Surgery

## 2012-10-02 DIAGNOSIS — R52 Pain, unspecified: Secondary | ICD-10-CM

## 2012-10-02 DIAGNOSIS — Z1389 Encounter for screening for other disorder: Secondary | ICD-10-CM | POA: Insufficient documentation

## 2012-10-17 ENCOUNTER — Other Ambulatory Visit: Payer: Self-pay | Admitting: Orthopedic Surgery

## 2012-10-17 MED ORDER — DEXAMETHASONE SODIUM PHOSPHATE 10 MG/ML IJ SOLN: 10.0000 mg | Freq: Once | INTRAMUSCULAR | Status: DC

## 2012-10-17 NOTE — Progress Notes (Signed)
Preoperative surgical orders have been place into the Epic hospital system for [facility] on 10/17/2012, 12:17 PM  by Patrica Duel for surgery on 11/15/12.  Preop Knee Scope orders including IV Tylenol and IV Decadron as long as there are no contraindications to the above medications. Avel Peace, PA-C

## 2012-10-27 ENCOUNTER — Other Ambulatory Visit (INDEPENDENT_AMBULATORY_CARE_PROVIDER_SITE_OTHER): Payer: BC Managed Care – PPO

## 2012-10-27 DIAGNOSIS — N400 Enlarged prostate without lower urinary tract symptoms: Secondary | ICD-10-CM

## 2012-10-27 DIAGNOSIS — E785 Hyperlipidemia, unspecified: Secondary | ICD-10-CM

## 2012-10-27 LAB — LIPID PANEL
Cholesterol: 176 mg/dL (ref 0–200)
Triglycerides: 78 mg/dL (ref 0.0–149.0)

## 2012-10-27 LAB — HEPATIC FUNCTION PANEL
ALT: 27 U/L (ref 0–53)
AST: 20 U/L (ref 0–37)
Albumin: 3.8 g/dL (ref 3.5–5.2)
Total Bilirubin: 0.8 mg/dL (ref 0.3–1.2)
Total Protein: 6.4 g/dL (ref 6.0–8.3)

## 2012-11-03 ENCOUNTER — Ambulatory Visit (INDEPENDENT_AMBULATORY_CARE_PROVIDER_SITE_OTHER): Payer: BC Managed Care – PPO | Admitting: Internal Medicine

## 2012-11-03 VITALS — BP 140/84 | HR 72 | Temp 98.0°F | Resp 16 | Ht 68.0 in | Wt 176.0 lb

## 2012-11-03 DIAGNOSIS — Z23 Encounter for immunization: Secondary | ICD-10-CM

## 2012-11-03 DIAGNOSIS — R002 Palpitations: Secondary | ICD-10-CM

## 2012-11-03 DIAGNOSIS — F5105 Insomnia due to other mental disorder: Secondary | ICD-10-CM

## 2012-11-03 DIAGNOSIS — F411 Generalized anxiety disorder: Secondary | ICD-10-CM

## 2012-11-03 DIAGNOSIS — I10 Essential (primary) hypertension: Secondary | ICD-10-CM

## 2012-11-03 MED ORDER — MIRTAZAPINE 15 MG PO TABS: 15.0000 mg | ORAL_TABLET | Freq: Every day | ORAL | Status: DC

## 2012-11-03 NOTE — Progress Notes (Signed)
Subjective:    Patient ID: Patrick [name], male    DOB: 1956-10-25, 56 y.o.   MRN: [id number]  HPI Hurt knee and has arthroscopic surgery Has not been exercising at prior level PSA Stress and mood issues Palpitations Occur mainly at night Sleep issues Anger issues Impulse controls    Review of Systems  Constitutional: Negative for fever and fatigue.  HENT: Negative for hearing loss, congestion, neck pain and postnasal drip.   Eyes: Negative for discharge, redness and visual disturbance.  Respiratory: Negative for cough, shortness of breath and wheezing.   Cardiovascular: Negative for leg swelling.  Gastrointestinal: Negative for abdominal pain, constipation and abdominal distention.  Genitourinary: Negative for urgency and frequency.  Musculoskeletal: Negative for joint swelling and arthralgias.  Skin: Negative for color change and rash.  Neurological: Negative for weakness and light-headedness.  Hematological: Negative for adenopathy.  Psychiatric/Behavioral: Negative for behavioral problems.   Past Medical History  Diagnosis Date  . Hyperlipidemia     History   Social History  . Marital Status: Married    Spouse Name: N/A    Number of Children: N/A  . Years of Education: N/A   Occupational History  . [phone]  . 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 Smoking status: Never Smoker   . Smokeless tobacco: Never Used  . Alcohol Use: Yes     Comment: occasional  . Drug Use: No  . Sexually Active: Not on file   Other Topics Concern  . Not on file   Social History Narrative  . No narrative on file    Past Surgical History  Procedure Date  . A/c repair right years ago  . Knee arthroscopy 11/15/2012    Procedure: ARTHROSCOPY KNEE;  Surgeon: Loanne Drilling, MD;  Location: [facility];  Service: Orthopedics;  Laterality: Right;  Right Knee Arthroscopy with Debridement    Family History  Problem Relation Age of  Onset  . Diabetes Mother   . Cancer Mother 45    lung cancer  . Heart disease Father   . Heart disease Brother   . Prostate cancer Brother     No Known Allergies  Current Outpatient Prescriptions on File Prior to Visit  Medication Sig Dispense Refill  . aspirin 81 MG tablet Take 81 mg by mouth daily.       . methocarbamol (ROBAXIN) 500 MG tablet Take 1 tablet (500 mg total) by mouth 4 (four) times daily.  30 tablet  1  . oxyCODONE-acetaminophen (ROXICET) 5-325 MG per tablet Take 1-2 tablets by mouth every 4 (four) hours as needed for pain.  40 tablet  0    BP 140/84  Pulse 72  Temp 98 F (36.7 C)  Resp 16  Ht 5\' 8"  (1.727 m)  Wt 176 lb (79.833 kg)  BMI 26.76 kg/m2       Objective:   Physical Exam  Nursing note and vitals reviewed. Constitutional: He appears well-developed and well-nourished.  HENT:  Head: Normocephalic and atraumatic.  Eyes: Conjunctivae normal are normal. Pupils are equal, round, and reactive to light.  Neck: Normal range of motion. Neck supple.  Cardiovascular: Normal rate and regular rhythm.   Pulmonary/Chest: Effort normal and breath sounds normal.  Abdominal: Soft. Bowel sounds are normal.          Assessment & Plan:  Presents for lipid management and labs were reviewed but secondary agenda was anger manement with symptoms a acute depression reviewed management  choices Referral for counseling Consider wellbutrin  I have spent more than 30 minutes examining this patient face-to-face of which over half was spent in counseling about anger and depression

## 2012-11-08 ENCOUNTER — Encounter (HOSPITAL_COMMUNITY): Payer: Self-pay | Admitting: Pharmacy Technician

## 2012-11-10 ENCOUNTER — Encounter (HOSPITAL_COMMUNITY): Payer: Self-pay

## 2012-11-10 ENCOUNTER — Encounter (HOSPITAL_COMMUNITY)
Admission: RE | Admit: 2012-11-10 | Discharge: 2012-11-10 | Disposition: A | Discharge status: Home / Self Care | Payer: BC Managed Care – PPO | Source: Ambulatory Visit | Attending: Orthopedic Surgery | Admitting: Orthopedic Surgery

## 2012-11-10 LAB — CBC
HCT: 43.5 % (ref 39.0–52.0)
MCH: 30.6 pg (ref 26.0–34.0)
MCV: 87.5 fL (ref 78.0–100.0)
Platelets: 233 10*3/uL (ref 150–400)
RBC: 4.97 MIL/uL (ref 4.22–5.81)

## 2012-11-10 NOTE — Patient Instructions (Addendum)
20 Patrick [name]  11/10/2012   Your procedure is scheduled on: 11/15/12  Report to [facility] at 0645 AM.  Call this number if you have problems the morning of surgery [phone]: [phone]   Remember:   Do not eat food or drink liquids After Midnight.     Take these medicines the morning of surgery with A SIP OF WATER:    Do not wear jewelry, make-up or nail polish.  Do not wear lotions, powders, or perfumes. You may wear deodorant.  Do not shave 48 hours prior to surgery. Men may shave face and neck.  Do not bring valuables to the hospital.  Contacts, dentures or bridgework may not be worn into surgery.  Leave suitcase in the car. After surgery it may be brought to your room.  For patients admitted to the hospital, checkout time is 11:00 AM the day of discharge.   Patients discharged the day of surgery will not be allowed to drive home.  Name and phone number of your driver:[name] (wife) cell [phone]  Special Instructions: Shower using CHG 2 nights before surgery and the night before surgery.  If you shower the day of surgery use CHG.  Use special wash - you have one bottle of CHG for all showers.  You should use approximately 1/3 of the bottle for each shower.   Please read over the following fact sheets that you were given: MRSA Information, incentive spirometer.   Birdie Sons, RN  pre op nurse call if needed [phone]

## 2012-11-14 NOTE — H&P (Signed)
  CC- Patrick [name] is a 56 y.o. male who presents with right knee pain.  HPI- . Knee Pain: Patient presents with a knee injury involving the  right knee. Onset of the symptoms was several months ago. Inciting event: twisting injury while playing ping pong. Current symptoms include giving out, pain located medially, stiffness and swelling. Pain is aggravated by lateral movements, pivoting and squatting.  Patient has had no prior knee problems. Evaluation to date: MRI: abnormal medial meniscal tear. Treatment to date: avoidance of offending activity and rest.  Past Medical History  Diagnosis Date  . Hyperlipidemia     Past Surgical History  Procedure Date  . A/c repair right years ago    Prior to Admission medications   Medication Sig Start Date End Date Taking? Authorizing Provider  aspirin 81 MG tablet Take 81 mg by mouth daily.     Historical Provider, MD  rosuvastatin (CRESTOR) 20 MG tablet Take 20 mg by mouth 2 (two) times a week. Wednesday and sunday    Historical Provider, MD   KNEE EXAM antalgic gait, soft tissue tenderness over medial joint line, collateral ligaments intact, negative Lachman sign, normal ipsilateral hip exam  Physical Examination: General appearance - alert, well appearing, and in no distress Mental status - alert, oriented to person, place, and time Chest - clear to auscultation, no wheezes, rales or rhonchi, symmetric air entry Heart - normal rate, regular rhythm, normal S1, S2, no murmurs, rubs, clicks or gallops Abdomen - soft, nontender, nondistended, no masses or organomegaly Neurological - alert, oriented, normal speech, no focal findings or movement disorder noted   Asessment/Plan--- Righft knee medial meniscal tear- - Plan Rightt knee arthroscopy with meniscal debridement. Procedure risks and potential comps discussed with patient who elects to proceed. Goals are decreased pain and increased function with a high likelihood of achieving both

## 2012-11-15 ENCOUNTER — Encounter (HOSPITAL_COMMUNITY)
Admission: RE | Disposition: A | Discharge status: Home / Self Care | Payer: Self-pay | Source: Ambulatory Visit | Attending: Orthopedic Surgery

## 2012-11-15 ENCOUNTER — Ambulatory Visit (HOSPITAL_COMMUNITY): Payer: BC Managed Care – PPO | Admitting: Registered Nurse

## 2012-11-15 ENCOUNTER — Encounter (HOSPITAL_COMMUNITY): Payer: Self-pay | Admitting: Registered Nurse

## 2012-11-15 ENCOUNTER — Ambulatory Visit (HOSPITAL_COMMUNITY)
Admission: RE | Admit: 2012-11-15 | Discharge: 2012-11-15 | Disposition: A | Discharge status: Home / Self Care | Payer: BC Managed Care – PPO | Source: Ambulatory Visit | Attending: Orthopedic Surgery | Admitting: Orthopedic Surgery

## 2012-11-15 ENCOUNTER — Encounter (HOSPITAL_COMMUNITY): Payer: Self-pay | Admitting: *Deleted

## 2012-11-15 DIAGNOSIS — IMO0002 Reserved for concepts with insufficient information to code with codable children: Secondary | ICD-10-CM | POA: Insufficient documentation

## 2012-11-15 DIAGNOSIS — E785 Hyperlipidemia, unspecified: Secondary | ICD-10-CM | POA: Insufficient documentation

## 2012-11-15 DIAGNOSIS — Z79899 Other long term (current) drug therapy: Secondary | ICD-10-CM | POA: Insufficient documentation

## 2012-11-15 DIAGNOSIS — Z01812 Encounter for preprocedural laboratory examination: Secondary | ICD-10-CM | POA: Insufficient documentation

## 2012-11-15 DIAGNOSIS — S83249A Other tear of medial meniscus, current injury, unspecified knee, initial encounter: Secondary | ICD-10-CM

## 2012-11-15 DIAGNOSIS — Z7982 Long term (current) use of aspirin: Secondary | ICD-10-CM | POA: Insufficient documentation

## 2012-11-15 DIAGNOSIS — X58XXXA Exposure to other specified factors, initial encounter: Secondary | ICD-10-CM | POA: Insufficient documentation

## 2012-11-15 HISTORY — PX: KNEE ARTHROSCOPY: SHX127

## 2012-11-15 SURGERY — ARTHROSCOPY, KNEE
Anesthesia: General | Site: Knee | Laterality: Right | Wound class: Clean

## 2012-11-15 MED ORDER — FENTANYL CITRATE 0.05 MG/ML IJ SOLN: 25.0000 ug | INTRAMUSCULAR | Status: DC | PRN

## 2012-11-15 MED ORDER — BUPIVACAINE-EPINEPHRINE 0.25% -1:200000 IJ SOLN
INTRAMUSCULAR | Status: DC | PRN
  Administered 2012-11-15: 30 mL

## 2012-11-15 MED ORDER — LACTATED RINGERS IR SOLN
Status: DC | PRN
  Administered 2012-11-15 (×5): 3000 mL

## 2012-11-15 MED ORDER — PROPOFOL 10 MG/ML IV BOLUS
INTRAVENOUS | Status: DC | PRN
  Administered 2012-11-15: 200 mg via INTRAVENOUS

## 2012-11-15 MED ORDER — MIDAZOLAM HCL 5 MG/5ML IJ SOLN
INTRAMUSCULAR | Status: DC | PRN
  Administered 2012-11-15: 2 mg via INTRAVENOUS

## 2012-11-15 MED ORDER — LACTATED RINGERS IV SOLN: INTRAVENOUS | Status: DC

## 2012-11-15 MED ORDER — KETOROLAC TROMETHAMINE 30 MG/ML IJ SOLN
INTRAMUSCULAR | Status: DC | PRN
  Administered 2012-11-15: 30 mg via INTRAVENOUS

## 2012-11-15 MED ORDER — SODIUM CHLORIDE 0.9 % IV SOLN: INTRAVENOUS | Status: DC

## 2012-11-15 MED ORDER — OXYCODONE-ACETAMINOPHEN 5-325 MG PO TABS: 1.0000 | ORAL_TABLET | ORAL | Status: DC | PRN

## 2012-11-15 MED ORDER — DEXAMETHASONE SODIUM PHOSPHATE 10 MG/ML IJ SOLN
INTRAMUSCULAR | Status: DC | PRN
  Administered 2012-11-15: 10 mg via INTRAVENOUS

## 2012-11-15 MED ORDER — ACETAMINOPHEN 10 MG/ML IV SOLN: 1000.0000 mg | Freq: Once | INTRAVENOUS | Status: DC

## 2012-11-15 MED ORDER — CEFAZOLIN SODIUM-DEXTROSE 2-3 GM-% IV SOLR
2.0000 g | INTRAVENOUS | Status: AC
  Administered 2012-11-15: 2 g via INTRAVENOUS

## 2012-11-15 MED ORDER — ACETAMINOPHEN 10 MG/ML IV SOLN
INTRAVENOUS | Status: AC
  Filled 2012-11-15: qty 100

## 2012-11-15 MED ORDER — LACTATED RINGERS IV SOLN
INTRAVENOUS | Status: DC | PRN
  Administered 2012-11-15: 1000 mL
  Administered 2012-11-15: 08:00:00 via INTRAVENOUS

## 2012-11-15 MED ORDER — FENTANYL CITRATE 0.05 MG/ML IJ SOLN
INTRAMUSCULAR | Status: DC | PRN
  Administered 2012-11-15: 50 ug via INTRAVENOUS
  Administered 2012-11-15: 100 ug via INTRAVENOUS
  Administered 2012-11-15 (×2): 50 ug via INTRAVENOUS

## 2012-11-15 MED ORDER — METHOCARBAMOL 500 MG PO TABS: 500.0000 mg | ORAL_TABLET | Freq: Four times a day (QID) | ORAL | Status: DC

## 2012-11-15 MED ORDER — ONDANSETRON HCL 4 MG/2ML IJ SOLN
INTRAMUSCULAR | Status: DC | PRN
  Administered 2012-11-15: 4 mg via INTRAVENOUS

## 2012-11-15 MED ORDER — LIDOCAINE HCL (CARDIAC) 10 MG/ML IV SOLN
INTRAVENOUS | Status: DC | PRN
  Administered 2012-11-15: 100 mg via INTRAVENOUS

## 2012-11-15 MED ORDER — ACETAMINOPHEN 10 MG/ML IV SOLN
INTRAVENOUS | Status: DC | PRN
  Administered 2012-11-15: 1000 mg via INTRAVENOUS

## 2012-11-15 MED ORDER — CEFAZOLIN SODIUM-DEXTROSE 2-3 GM-% IV SOLR
INTRAVENOUS | Status: AC
  Filled 2012-11-15: qty 50

## 2012-11-15 MED ORDER — PROMETHAZINE HCL 25 MG/ML IJ SOLN: 6.2500 mg | INTRAMUSCULAR | Status: DC | PRN

## 2012-11-15 MED ORDER — OXYCODONE-ACETAMINOPHEN 5-325 MG PO TABS
1.0000 | ORAL_TABLET | ORAL | Status: DC | PRN
Start: 2012-11-15 — End: 2012-11-15
  Administered 2012-11-15: 1 via ORAL
  Filled 2012-11-15: qty 1

## 2012-11-15 MED ORDER — BUPIVACAINE-EPINEPHRINE PF 0.25-1:200000 % IJ SOLN
INTRAMUSCULAR | Status: AC
  Filled 2012-11-15: qty 30

## 2012-11-15 SURGICAL SUPPLY — 26 items
BANDAGE ELASTIC 6 VELCRO ST LF (GAUZE/BANDAGES/DRESSINGS) ×2 IMPLANT
BLADE 4.2CUDA (BLADE) ×2 IMPLANT
CLOTH BEACON ORANGE TIMEOUT ST (SAFETY) ×2 IMPLANT
CUFF TOURN SGL QUICK 34 (TOURNIQUET CUFF) ×1
CUFF TRNQT CYL 34X4X40X1 (TOURNIQUET CUFF) ×1 IMPLANT
DRAPE U-SHAPE 47X51 STRL (DRAPES) ×2 IMPLANT
DRSG EMULSION OIL 3X3 NADH (GAUZE/BANDAGES/DRESSINGS) ×2 IMPLANT
DRSG PAD ABDOMINAL 8X10 ST (GAUZE/BANDAGES/DRESSINGS) ×2 IMPLANT
DURAPREP 26ML APPLICATOR (WOUND CARE) ×2 IMPLANT
GLOVE BIO SURGEON STRL SZ7.5 (GLOVE) ×2 IMPLANT
GLOVE BIO SURGEON STRL SZ8 (GLOVE) ×2 IMPLANT
GLOVE BIOGEL PI IND STRL 8 (GLOVE) ×1 IMPLANT
GLOVE BIOGEL PI INDICATOR 8 (GLOVE) ×1
GOWN STRL NON-REIN LRG LVL3 (GOWN DISPOSABLE) ×2 IMPLANT
MANIFOLD NEPTUNE II (INSTRUMENTS) ×4 IMPLANT
PACK ARTHROSCOPY WL (CUSTOM PROCEDURE TRAY) ×2 IMPLANT
PACK ICE MAXI GEL EZY WRAP (MISCELLANEOUS) ×6 IMPLANT
PADDING CAST COTTON 6X4 STRL (CAST SUPPLIES) ×2 IMPLANT
POSITIONER SURGICAL ARM (MISCELLANEOUS) ×2 IMPLANT
SET ARTHROSCOPY TUBING (MISCELLANEOUS) ×1
SET ARTHROSCOPY TUBING LN (MISCELLANEOUS) ×1 IMPLANT
SPONGE GAUZE 4X4 12PLY (GAUZE/BANDAGES/DRESSINGS) ×2 IMPLANT
SUT ETHILON 4 0 PS 2 18 (SUTURE) ×2 IMPLANT
TOWEL OR 17X26 10 PK STRL BLUE (TOWEL DISPOSABLE) ×2 IMPLANT
WAND 90 DEG TURBOVAC W/CORD (SURGICAL WAND) ×2 IMPLANT
WRAP KNEE MAXI GEL POST OP (GAUZE/BANDAGES/DRESSINGS) ×4 IMPLANT

## 2012-11-15 NOTE — Op Note (Signed)
Preoperative diagnosis-  Right knee medial meniscal tear  Postoperative diagnosis Right- knee medial meniscal tear    Procedure- Right knee arthroscopy with medial meniscal debridement    Surgeon- Gus Rankin. Jariyah Hackley, MD  Anesthesia-General  EBL-  Minimal  Complications- None  Condition- PACU - hemodynamically stable.  Brief clinical note- [name] is a 56 y.o.  male with a several month history of left knee pain and mechanical symptoms. Exam and history suggested medial meniscal tear confirmed by MRI. The patient presents now for arthroscopy and debridement  Procedure in detail -       After successful administration of General anesthetic, a tourmiquet is placed high on the Right  thigh and the Right lower extremity is prepped and draped in the usual sterile fashion. Time out is performed by the surgical team. Standard superomedial and inferolateral portal sites are marked and incisions made with an 11 blade. The inflow cannula is passed through the superomedial portal and camera through the inferolateral portal and inflow is initiated. Arthroscopic visualization proceeds.      The undersurface of the patella and trochlea are visualized and there is mild chondromalacia patella without any unstable cartilage defects. The medial and lateral gutters are visualized and there are  no loose bodies. Flexion and valgus force is applied to the knee and the medial compartment is entered. A spinal needle is passed into the joint through the site marked for the inferomedial portal. A small incision is made and the dilator passed into the joint. The findings for the medial compartment are bucket handle tear medial meniscus with large fragment entrapped in the intercondylar notch. The displaced fragment is removed and the remaining meniscus is debrided to a stable base with baskets and a shaver and sealed off with the Arthrocare. There are no associated chondral defects.    The intercondylar notch is  visualized and the ACL appears normal. The lateral compartment is entered and the findings are normal .      The joint is again inspected and there are no other tears, defects or loose bodies identified. The arthroscopic equipment is then removed from the inferior portals which are closed with interrupted 4-0 nylon. 20 ml of .25% Marcaine with epinephrine are injected through the inflow cannula and the cannula is then removed and the portal closed with nylon. The incisions are cleaned and dried and a bulky sterile dressing is applied. The patient is then awakened and transported to recovery in stable condition.   11/15/2012, 9:26 AM

## 2012-11-15 NOTE — Anesthesia Postprocedure Evaluation (Signed)
Anesthesia Post Note  Patient: Patrick [name]  Procedure(s) Performed: Procedure(s) (LRB): ARTHROSCOPY KNEE (Right)  Anesthesia type: General  Patient location: PACU  Post pain: Pain level controlled  Post assessment: Post-op Vital signs reviewed  Last Vitals:  Filed Vitals:   11/15/12 1130  BP: 154/76  Pulse: 66  Temp:   Resp: 18    Post vital signs: Reviewed  Level of consciousness: sedated  Complications: No apparent anesthesia complications

## 2012-11-15 NOTE — Anesthesia Preprocedure Evaluation (Signed)
Anesthesia Evaluation  Patient identified by MRN, date of birth, ID band Patient awake    Reviewed: Allergy & Precautions, H&P , NPO status , Patient's Chart, lab work & pertinent test results  Airway Mallampati: II TM Distance: >3 FB Neck ROM: Full    Dental  (+) Teeth Intact and Dental Advisory Given   Pulmonary neg pulmonary ROS,  breath sounds clear to auscultation  Pulmonary exam normal       Cardiovascular negative cardio ROS  Rhythm:Regular Rate:Normal     Neuro/Psych negative neurological ROS  negative psych ROS   GI/Hepatic negative GI ROS, Neg liver ROS,   Endo/Other  negative endocrine ROS  Renal/GU negative Renal ROS  negative genitourinary   Musculoskeletal negative musculoskeletal ROS (+)   Abdominal   Peds  Hematology negative hematology ROS (+)   Anesthesia Other Findings   Reproductive/Obstetrics negative OB ROS                           Anesthesia Physical Anesthesia Plan  ASA: I  Anesthesia Plan: General   Post-op Pain Management:    Induction: Intravenous  Airway Management Planned: LMA  Additional Equipment:   Intra-op Plan:   Post-operative Plan: Extubation in OR  Informed Consent: I have reviewed the patients History and Physical, chart, labs and discussed the procedure including the risks, benefits and alternatives for the proposed anesthesia with the patient or authorized representative who has indicated his/her understanding and acceptance.   Dental advisory given  Plan Discussed with: CRNA  Anesthesia Plan Comments:         Anesthesia Quick Evaluation  

## 2012-11-15 NOTE — Interval H&P Note (Signed)
History and Physical Interval Note:  11/15/2012 8:21 AM  Patrick [name]  has presented today for surgery, with the diagnosis of Right Knee Medial Meniscal Tear  The various methods of treatment have been discussed with the patient and family. After consideration of risks, benefits and other options for treatment, the patient has consented to  Procedure(s) (LRB) with comments: ARTHROSCOPY KNEE (Right) - Right Knee Arthroscopy with Debridement as a surgical intervention .  The patient's history has been reviewed, patient examined, no change in status, stable for surgery.  I have reviewed the patient's chart and labs.  Questions were answered to the patient's satisfaction.     Loanne Drilling

## 2012-11-15 NOTE — Transfer of Care (Signed)
Immediate Anesthesia Transfer of Care Note  Patient: Patrick [name]  Procedure(s) Performed: Procedure(s) (LRB) with comments: ARTHROSCOPY KNEE (Right) - Right Knee Arthroscopy with Debridement  Patient Location: PACU  Anesthesia Type:General  Level of Consciousness: awake, alert , oriented and patient cooperative  Airway & Oxygen Therapy: Patient Spontanous Breathing and Patient connected to face mask oxygen  Post-op Assessment: Report given to PACU RN, Post -op Vital signs reviewed and stable and Patient moving all extremities X 4  Post vital signs: stable  Complications: No apparent anesthesia complications

## 2012-11-20 ENCOUNTER — Encounter (HOSPITAL_COMMUNITY): Payer: Self-pay | Admitting: Orthopedic Surgery

## 2012-12-25 ENCOUNTER — Ambulatory Visit (INDEPENDENT_AMBULATORY_CARE_PROVIDER_SITE_OTHER): Payer: BC Managed Care – PPO | Admitting: Psychology

## 2012-12-25 DIAGNOSIS — F411 Generalized anxiety disorder: Secondary | ICD-10-CM

## 2012-12-31 ENCOUNTER — Encounter: Payer: Self-pay | Admitting: Internal Medicine

## 2012-12-31 NOTE — Patient Instructions (Addendum)
The patient is instructed to continue all medications as prescribed. Schedule followup with check out clerk upon leaving the clinic  

## 2013-01-15 ENCOUNTER — Encounter: Payer: Self-pay | Admitting: Family Medicine

## 2013-01-15 ENCOUNTER — Ambulatory Visit (INDEPENDENT_AMBULATORY_CARE_PROVIDER_SITE_OTHER): Payer: BC Managed Care – PPO | Admitting: Psychology

## 2013-01-15 ENCOUNTER — Ambulatory Visit (INDEPENDENT_AMBULATORY_CARE_PROVIDER_SITE_OTHER): Payer: BC Managed Care – PPO | Admitting: Family Medicine

## 2013-01-15 VITALS — BP 122/80 | HR 68 | Temp 98.0°F | Wt 178.0 lb

## 2013-01-15 DIAGNOSIS — F411 Generalized anxiety disorder: Secondary | ICD-10-CM

## 2013-01-15 DIAGNOSIS — J329 Chronic sinusitis, unspecified: Secondary | ICD-10-CM

## 2013-01-15 MED ORDER — AZITHROMYCIN 250 MG PO TABS: ORAL_TABLET | ORAL | Status: DC

## 2013-01-15 NOTE — Progress Notes (Signed)
  Subjective:    Patient ID: Patrick [name], male    DOB: 05-31-1956, 57 y.o.   MRN: [id number]  HPI Here for 2 weeks of sinus pressure, HA, PND, and blowing green mucus from the nose. No cough or fever.    Review of Systems  Constitutional: Negative.   HENT: Positive for congestion, postnasal drip and sinus pressure.   Eyes: Negative.   Respiratory: Negative.        Objective:   Physical Exam  Constitutional: He appears well-developed and well-nourished.  HENT:  Right Ear: External ear normal.  Left Ear: External ear normal.  Nose: Nose normal.  Mouth/Throat: Oropharynx is clear and moist. No oropharyngeal exudate.  Eyes: Conjunctivae normal are normal.  Pulmonary/Chest: Effort normal and breath sounds normal.  Lymphadenopathy:    He has no cervical adenopathy.          Assessment & Plan:  Add Mucinex D.

## 2013-01-26 ENCOUNTER — Ambulatory Visit: Payer: BC Managed Care – PPO | Admitting: Internal Medicine

## 2013-05-02 ENCOUNTER — Other Ambulatory Visit: Payer: BC Managed Care – PPO

## 2013-05-03 ENCOUNTER — Other Ambulatory Visit (INDEPENDENT_AMBULATORY_CARE_PROVIDER_SITE_OTHER): Payer: BC Managed Care – PPO

## 2013-05-03 DIAGNOSIS — Z0000 Encounter for general adult medical examination without abnormal findings: Secondary | ICD-10-CM

## 2013-05-03 LAB — HEPATIC FUNCTION PANEL
Albumin: 3.9 g/dL (ref 3.5–5.2)
Alkaline Phosphatase: 50 U/L (ref 39–117)
Bilirubin, Direct: 0.1 mg/dL (ref 0.0–0.3)

## 2013-05-03 LAB — CBC WITH DIFFERENTIAL/PLATELET
Basophils Absolute: 0 10*3/uL (ref 0.0–0.1)
Eosinophils Absolute: 0.2 10*3/uL (ref 0.0–0.7)
Lymphocytes Relative: 25.8 % (ref 12.0–46.0)
MCHC: 34.8 g/dL (ref 30.0–36.0)
Neutrophils Relative %: 60.7 % (ref 43.0–77.0)
RDW: 13 % (ref 11.5–14.6)

## 2013-05-03 LAB — POCT URINALYSIS DIPSTICK
Bilirubin, UA: NEGATIVE
Ketones, UA: NEGATIVE
Leukocytes, UA: NEGATIVE
Protein, UA: NEGATIVE

## 2013-05-03 LAB — BASIC METABOLIC PANEL
CO2: 27 mEq/L (ref 19–32)
Calcium: 8.9 mg/dL (ref 8.4–10.5)
Creatinine, Ser: 0.9 mg/dL (ref 0.4–1.5)
Glucose, Bld: 89 mg/dL (ref 70–99)

## 2013-05-03 LAB — LIPID PANEL
Cholesterol: 150 mg/dL (ref 0–200)
VLDL: 11 mg/dL (ref 0.0–40.0)

## 2013-05-09 ENCOUNTER — Ambulatory Visit (INDEPENDENT_AMBULATORY_CARE_PROVIDER_SITE_OTHER): Payer: BC Managed Care – PPO | Admitting: Internal Medicine

## 2013-05-09 ENCOUNTER — Encounter: Payer: Self-pay | Admitting: Internal Medicine

## 2013-05-09 VITALS — BP 110/70 | HR 72 | Temp 98.6°F | Resp 16 | Ht 68.0 in | Wt 178.0 lb

## 2013-05-09 DIAGNOSIS — Z0000 Encounter for general adult medical examination without abnormal findings: Secondary | ICD-10-CM

## 2013-05-09 DIAGNOSIS — E785 Hyperlipidemia, unspecified: Secondary | ICD-10-CM

## 2013-05-09 DIAGNOSIS — Z87442 Personal history of urinary calculi: Secondary | ICD-10-CM

## 2013-05-09 NOTE — Assessment & Plan Note (Signed)
The patient has a hx of stones but no current symptoms stayu hydrated

## 2013-05-09 NOTE — Patient Instructions (Signed)
The patient is instructed to continue all medications as prescribed. Schedule followup with check out clerk upon leaving the clinic  

## 2013-05-09 NOTE — Progress Notes (Signed)
Subjective:    Patient ID: Patrick [name], male    DOB: 1956-12-07, 57 y.o.   MRN: [id number]  HPI CPX    Review of Systems  Constitutional: Negative for fever and fatigue.  HENT: Negative for hearing loss, congestion, neck pain and postnasal drip.   Eyes: Negative for discharge, redness and visual disturbance.  Respiratory: Negative for cough, shortness of breath and wheezing.   Cardiovascular: Negative for leg swelling.  Gastrointestinal: Negative for abdominal pain, constipation and abdominal distention.  Genitourinary: Negative for urgency and frequency.  Musculoskeletal: Negative for joint swelling and arthralgias.  Skin: Negative for color change and rash.  Neurological: Negative for weakness and light-headedness.  Hematological: Negative for adenopathy.  Psychiatric/Behavioral: Negative for behavioral problems.   Past Medical History  Diagnosis Date  . Hyperlipidemia     History   Social History  . Marital Status: Married    Spouse Name: N/A    Number of Children: N/A  . Years of Education: N/A   Occupational History  . [id number]  . 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 Smoking status: Never Smoker   . Smokeless tobacco: Never Used  . Alcohol Use: Yes     Comment: occasional  . Drug Use: No  . Sexually Active: Not on file   Other Topics Concern  . Not on file   Social History Narrative  . No narrative on file    Past Surgical History  Procedure Laterality Date  . A/c repair right  years ago  . Knee arthroscopy  11/15/2012    Procedure: ARTHROSCOPY KNEE;  Surgeon: Loanne Drilling, MD;  Location: [facility];  Service: Orthopedics;  Laterality: Right;  Right Knee Arthroscopy with Debridement    Family History  Problem Relation Age of Onset  . Diabetes Mother   . Cancer Mother 85    lung cancer  . Heart disease Father   . Heart disease Brother   . Prostate cancer Brother     No Known  Allergies  Current Outpatient Prescriptions on File Prior to Visit  Medication Sig Dispense Refill  . aspirin 81 MG tablet Take 81 mg by mouth daily.       . rosuvastatin (CRESTOR) 20 MG tablet Take 20 mg by mouth 2 (two) times a week. Wednesday and sunday       No current facility-administered medications on file prior to visit.    BP 110/70  Pulse 72  Temp(Src) 98.6 F (37 C)  Resp 16  Ht 5\' 8"  (1.727 m)  Wt 178 lb (80.74 kg)  BMI 27.07 kg/m2       Objective:   Physical Exam  Nursing note and vitals reviewed. Constitutional: He is oriented to person, place, and time. He appears well-developed and well-nourished.  HENT:  Head: Normocephalic and atraumatic.  Eyes: Conjunctivae are normal. Pupils are equal, round, and reactive to light.  Neck: Normal range of motion. Neck supple.  Cardiovascular: Normal rate and regular rhythm.   Pulmonary/Chest: Effort normal and breath sounds normal.  Abdominal: Soft. Bowel sounds are normal.  Genitourinary: Rectum normal and prostate normal.  Musculoskeletal: Normal range of motion.  Neurological: He is alert and oriented to person, place, and time.  Skin: Skin is warm and dry.  Psychiatric: He has a normal mood and affect.          Assessment & Plan:   Patient presents for yearly preventative medicine examination.   all immunizations  and health maintenance protocols were reviewed with the patient and they are up to date with these protocols.   screening laboratory values were reviewed with the patient including screening of hyperlipidemia PSA renal function and hepatic function.   There medications past medical history social history problem list and allergies were reviewed in detail.   Goals were established with regard to weight loss exercise diet in compliance with medications  reviewed all lab work

## 2013-10-25 ENCOUNTER — Other Ambulatory Visit: Payer: Self-pay

## 2013-11-02 ENCOUNTER — Other Ambulatory Visit (INDEPENDENT_AMBULATORY_CARE_PROVIDER_SITE_OTHER): Payer: BC Managed Care – PPO

## 2013-11-02 DIAGNOSIS — E785 Hyperlipidemia, unspecified: Secondary | ICD-10-CM

## 2013-11-02 LAB — LDL CHOLESTEROL, DIRECT: Direct LDL: 150.3 mg/dL

## 2013-11-02 LAB — HEPATIC FUNCTION PANEL
ALT: 31 U/L (ref 0–53)
AST: 23 U/L (ref 0–37)
Bilirubin, Direct: 0 mg/dL (ref 0.0–0.3)
Total Protein: 7.2 g/dL (ref 6.0–8.3)

## 2013-11-02 LAB — LIPID PANEL: Triglycerides: 101 mg/dL (ref 0.0–149.0)

## 2013-11-04 ENCOUNTER — Emergency Department (HOSPITAL_COMMUNITY)
Admission: EM | Admit: 2013-11-04 | Discharge: 2013-11-04 | Disposition: A | Discharge status: Home / Self Care | Payer: BC Managed Care – PPO | Attending: Emergency Medicine | Admitting: Emergency Medicine

## 2013-11-04 ENCOUNTER — Encounter (HOSPITAL_COMMUNITY): Payer: Self-pay | Admitting: Emergency Medicine

## 2013-11-04 DIAGNOSIS — E785 Hyperlipidemia, unspecified: Secondary | ICD-10-CM | POA: Insufficient documentation

## 2013-11-04 DIAGNOSIS — Z79899 Other long term (current) drug therapy: Secondary | ICD-10-CM | POA: Insufficient documentation

## 2013-11-04 DIAGNOSIS — Y9389 Activity, other specified: Secondary | ICD-10-CM | POA: Insufficient documentation

## 2013-11-04 DIAGNOSIS — S058X9A Other injuries of unspecified eye and orbit, initial encounter: Secondary | ICD-10-CM | POA: Insufficient documentation

## 2013-11-04 DIAGNOSIS — T1592XA Foreign body on external eye, part unspecified, left eye, initial encounter: Secondary | ICD-10-CM

## 2013-11-04 DIAGNOSIS — Y929 Unspecified place or not applicable: Secondary | ICD-10-CM | POA: Insufficient documentation

## 2013-11-04 DIAGNOSIS — T1590XA Foreign body on external eye, part unspecified, unspecified eye, initial encounter: Secondary | ICD-10-CM | POA: Insufficient documentation

## 2013-11-04 DIAGNOSIS — Z7982 Long term (current) use of aspirin: Secondary | ICD-10-CM | POA: Insufficient documentation

## 2013-11-04 DIAGNOSIS — T1500XA Foreign body in cornea, unspecified eye, initial encounter: Secondary | ICD-10-CM | POA: Insufficient documentation

## 2013-11-04 DIAGNOSIS — S0502XA Injury of conjunctiva and corneal abrasion without foreign body, left eye, initial encounter: Secondary | ICD-10-CM

## 2013-11-04 DIAGNOSIS — T1591XA Foreign body on external eye, part unspecified, right eye, initial encounter: Secondary | ICD-10-CM

## 2013-11-04 MED ORDER — HYDROCODONE-ACETAMINOPHEN 5-325 MG PO TABS: 1.0000 | ORAL_TABLET | ORAL | Status: DC | PRN

## 2013-11-04 MED ORDER — ERYTHROMYCIN 5 MG/GM OP OINT: 1.0000 "application " | TOPICAL_OINTMENT | Freq: Four times a day (QID) | OPHTHALMIC | Status: DC

## 2013-11-04 MED ORDER — TETRACAINE HCL 0.5 % OP SOLN
2.0000 [drp] | Freq: Once | OPHTHALMIC | Status: AC
  Administered 2013-11-04: 2 [drp] via OPHTHALMIC
  Filled 2013-11-04: qty 2

## 2013-11-04 NOTE — ED Notes (Signed)
Pt working on [employer] and began to feel burning in both eyes. Pt states feels better with eyes closed. Eyes are watery.

## 2013-11-04 NOTE — ED Notes (Signed)
Morgan lens eye irrigation initiated on L eye

## 2013-11-04 NOTE — ED Provider Notes (Signed)
CSN: [id number]     Arrival date & time 11/04/13  2001 History  This chart was scribed for non-physician practitioner, Arthor Captain, PA-C,working with Candyce Churn, MD, by Karle Plumber, ED Scribe.  This patient was seen in room WTR8/WTR8 and the patient's care was started at 8:31 PM.  Chief Complaint  Patient presents with  . Eye Pain   The history is provided by the patient and the spouse. No language interpreter was used.   HPI Comments:  Patrick [name] is a 57 y.o. male who presents to the Emergency Department complaining of bilateral eye pain onset 4 hours ago. 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 His wife states he was also covered in soot. He states he was wearing regular eye glasses. Pt reports associated eye burning and tearing. He states it hurts to keep his eyes open. He denies any visual changes.   Past Medical History  Diagnosis Date  . Hyperlipidemia    Past Surgical History  Procedure Laterality Date  . A/c repair right  years ago  . Knee arthroscopy  11/15/2012    Procedure: ARTHROSCOPY KNEE;  Surgeon: Loanne Drilling, MD;  Location: [facility];  Service: Orthopedics;  Laterality: Right;  Right Knee Arthroscopy with Debridement   Family History  Problem Relation Age of Onset  . Diabetes Mother   . Cancer Mother 9    lung cancer  . Heart disease Father   . Heart disease Brother   . Prostate cancer Brother    History  Substance Use Topics  . Smoking status: Never Smoker   . Smokeless tobacco: Never Used  . Alcohol Use: Yes     Comment: occasional    Review of Systems  Eyes: Positive for pain, discharge and redness. Negative for visual disturbance.    Allergies  Review of patient's allergies indicates no known allergies.  Home Medications   Current Outpatient Rx  Name  Route  Sig  Dispense  Refill  . aspirin 81 MG tablet   Oral   Take 81 mg by mouth  daily.          . rosuvastatin (CRESTOR) 20 MG tablet   Oral   Take 20 mg by mouth 2 (two) times a week. Wednesday and sunday          There were no vitals taken for this visit. Physical Exam  Nursing note and vitals reviewed. Constitutional: He is oriented to person, place, and time. He appears well-developed and well-nourished. No distress.  HENT:  Head: Normocephalic and atraumatic.  Eyes: EOM are normal. Pupils are equal, round, and reactive to light. Right eye exhibits chemosis and discharge. Left eye exhibits chemosis and discharge. Right conjunctiva is injected. Left conjunctiva is injected. No scleral icterus.  Slit lamp exam:      The right eye shows foreign body.       The left eye shows corneal abrasion, foreign body and fluorescein uptake.  Neck: Neck supple.  Cardiovascular: Normal rate and intact distal pulses.   Pulmonary/Chest: Effort normal. No stridor. No respiratory distress.  Abdominal: Normal appearance. He exhibits no distension.  Neurological: He is alert and oriented to person, place, and time.  Skin: Skin is warm and dry. No rash noted.  Psychiatric: He has a normal mood and affect. His behavior is normal.    ED Course  FOREIGN BODY REMOVAL Date/Time: 11/05/2013  2:56 PM Performed by: Arthor Captain Authorized by: Arthor Captain Consent: Verbal consent obtained. Consent given by: patient Body area: eye (BL eyes) Anesthesia method: topical. Local anesthetic: topical anesthetic Anesthetic total: 4 drops Localization method: eyelid eversion Removal mechanism: moist cotton swab and irrigation Eye examined with fluorescein. Fluorescein uptake (left cornea). Corneal abrasion size: medium Corneal abrasion location: medial Depth: embedded Complexity: complex Objects recovered: partial recovery of fine metal flakes and soot, others embedded- unable to remove Post-procedure assessment: foreign body not removed Patient tolerance: Patient tolerated the  procedure well with no immediate complications.     COORDINATION OF CARE: 8:36 PM- Will check for corneal abrasion and complete visual acuity test. Pt verbalizes understanding and agrees to plan.  8:51 PM- os pressure 13. od pressure 11.  Medications  tetracaine (PONTOCAINE) 0.5 % ophthalmic solution 2 drop (2 drops Right Eye Given 11/04/13 2044)   Labs Review Labs Reviewed - No data to display Imaging Review No results found.  EKG Interpretation   None       MDM   1. Eye foreign bodies, left, initial encounter   2. Eye foreign bodies, right, initial encounter   3. Corneal abrasion, left, initial encounter     Patient with ground in FBs to the eyes. I anesthetized the eyes and tried to remove with swabs.  Patient eyes were then irrigated with 1 liter of fluid each by morgans lens. I Spoke with the patient's ophthalmologist Dr. Omer Jack of [facility] who states that patient may folow up with him first thing in the morning (8:00am) and agrees with my POC. After irrigation I again anesthetzed the patient's eyes and attempted to remove more FBs, however was unsuccessful. Pateint able to hold eyes open for longer period of time with less discomfort.  Will d/c with tetracaine to be used PRN, e-mycin ointment, pain medication Patient is to follow up closely with his eye doctor, The patient appears reasonably screened and/or stabilized for discharge and I doubt any other medical condition or other [facility] requiring further screening, evaluation, or treatment in the ED at this time prior to discharge.   I personally performed the services described in this documentation, which was scribed in my presence. The recorded information has been reviewed and is accurate.     Arthor Captain, PA-C 11/05/13 1501

## 2013-11-04 NOTE — ED Notes (Signed)
Bilateral eye irrigation initiated per PA order

## 2013-11-04 NOTE — ED Notes (Signed)
Morgan lens irrigation complete

## 2013-11-04 NOTE — ED Provider Notes (Signed)
Medical screening examination/treatment/procedure(s) were conducted as a shared visit with non-physician practitioner(s) and myself.  I personally evaluated the patient during the encounter.  EKG Interpretation   None       57 yo male with bilateral eye foreign bodies after cutting a metal grate while lying underneath.  No distress, reports no decreased visual acuity.  On exam, multiple fine foreign bodies evident.  Plan to anesthetize, irrigate, and attempt removal.  Ophtho follow up.   Clinical Impression: 1. Eye foreign bodies, left, initial encounter   2. Eye foreign bodies, right, initial encounter   3. Corneal abrasion, left, initial encounter       Candyce Churn, MD 11/05/13 1511

## 2013-11-05 NOTE — ED Provider Notes (Signed)
Medical screening examination/treatment/procedure(s) were conducted as a shared visit with non-physician practitioner(s) and myself.  I personally evaluated the patient during the encounter.  EKG Interpretation   None         Candyce Churn, MD 11/05/13 [phone]

## 2013-11-09 ENCOUNTER — Encounter: Payer: Self-pay | Admitting: Internal Medicine

## 2013-11-09 ENCOUNTER — Ambulatory Visit (INDEPENDENT_AMBULATORY_CARE_PROVIDER_SITE_OTHER): Payer: BC Managed Care – PPO | Admitting: Internal Medicine

## 2013-11-09 VITALS — BP 136/84 | HR 72 | Temp 98.2°F | Resp 16 | Ht 68.0 in | Wt 180.0 lb

## 2013-11-09 DIAGNOSIS — E785 Hyperlipidemia, unspecified: Secondary | ICD-10-CM

## 2013-11-09 DIAGNOSIS — T887XXA Unspecified adverse effect of drug or medicament, initial encounter: Secondary | ICD-10-CM

## 2013-11-09 DIAGNOSIS — Z23 Encounter for immunization: Secondary | ICD-10-CM

## 2013-11-09 NOTE — Progress Notes (Signed)
  Subjective:    Patient ID: Patrick [name], male    DOB: 04/19/1956, 57 y.o.   MRN: [id number]  Hyperlipidemia Pertinent negatives include no shortness of breath.    Lipid follow up  Review of Systems  Constitutional: Negative for fever and fatigue.  HENT: Negative for congestion, hearing loss and postnasal drip.   Eyes: Negative for discharge, redness and visual disturbance.  Respiratory: Negative for cough, shortness of breath and wheezing.   Cardiovascular: Negative for leg swelling.  Gastrointestinal: Negative for abdominal pain, constipation and abdominal distention.  Genitourinary: Negative for urgency and frequency.  Musculoskeletal: Negative for arthralgias, joint swelling and neck pain.  Skin: Negative for color change and rash.  Neurological: Negative for weakness and light-headedness.  Hematological: Negative for adenopathy.  Psychiatric/Behavioral: Negative for behavioral problems.       Objective:   Physical Exam  Nursing note and vitals reviewed. Constitutional: He appears well-developed and well-nourished.  HENT:  Head: Normocephalic and atraumatic.  Eyes: Conjunctivae are normal. Pupils are equal, round, and reactive to light.  Neck: Neck supple.  Cardiovascular: Normal rate and regular rhythm.   Pulmonary/Chest: Breath sounds normal.  Psychiatric: He has a normal mood and affect. His behavior is normal.          Assessment & Plan:  ldl c is not at goal Liver normal  Rise in LDL due to lack of exercise commitant for exercise  May CPX

## 2013-11-09 NOTE — Patient Instructions (Signed)
The patient is instructed to continue all medications as prescribed. Schedule followup with check out clerk upon leaving the clinic Goal of increased exercise with the possibility that we will increase the crestor of there is regression

## 2013-11-09 NOTE — Progress Notes (Signed)
Pre-visit discussion using our clinic review tool. No additional management support is needed unless otherwise documented below in the visit note.  

## 2014-05-14 ENCOUNTER — Other Ambulatory Visit (INDEPENDENT_AMBULATORY_CARE_PROVIDER_SITE_OTHER): Payer: 59

## 2014-05-14 DIAGNOSIS — Z0000 Encounter for general adult medical examination without abnormal findings: Secondary | ICD-10-CM

## 2014-05-14 LAB — CBC WITH DIFFERENTIAL/PLATELET
BASOS ABS: 0 10*3/uL (ref 0.0–0.1)
Basophils Relative: 0.5 % (ref 0.0–3.0)
Eosinophils Absolute: 0.2 10*3/uL (ref 0.0–0.7)
Eosinophils Relative: 2.8 % (ref 0.0–5.0)
HCT: 43.5 % (ref 39.0–52.0)
Hemoglobin: 14.8 g/dL (ref 13.0–17.0)
LYMPHS ABS: 1.3 10*3/uL (ref 0.7–4.0)
LYMPHS PCT: 23 % (ref 12.0–46.0)
MCHC: 34 g/dL (ref 30.0–36.0)
MCV: 91.5 fl (ref 78.0–100.0)
MONOS PCT: 6.9 % (ref 3.0–12.0)
Monocytes Absolute: 0.4 10*3/uL (ref 0.1–1.0)
NEUTROS PCT: 66.8 % (ref 43.0–77.0)
Neutro Abs: 3.9 10*3/uL (ref 1.4–7.7)
PLATELETS: 208 10*3/uL (ref 150.0–400.0)
RBC: 4.75 Mil/uL (ref 4.22–5.81)
RDW: 13 % (ref 11.5–15.5)
WBC: 5.8 10*3/uL (ref 4.0–10.5)

## 2014-05-14 LAB — HEPATIC FUNCTION PANEL
ALK PHOS: 44 U/L (ref 39–117)
ALT: 34 U/L (ref 0–53)
AST: 27 U/L (ref 0–37)
Albumin: 3.8 g/dL (ref 3.5–5.2)
BILIRUBIN DIRECT: 0.1 mg/dL (ref 0.0–0.3)
BILIRUBIN TOTAL: 1 mg/dL (ref 0.2–1.2)
Total Protein: 6.2 g/dL (ref 6.0–8.3)

## 2014-05-14 LAB — BASIC METABOLIC PANEL
BUN: 13 mg/dL (ref 6–23)
CALCIUM: 9.1 mg/dL (ref 8.4–10.5)
CO2: 26 mEq/L (ref 19–32)
Chloride: 105 mEq/L (ref 96–112)
Creatinine, Ser: 0.8 mg/dL (ref 0.4–1.5)
GFR: 112.24 mL/min (ref >60.00)
GLUCOSE: 87 mg/dL (ref 70–99)
Potassium: 4.4 mEq/L (ref 3.5–5.1)
Sodium: 139 mEq/L (ref 135–145)

## 2014-05-14 LAB — POCT URINALYSIS DIPSTICK
BILIRUBIN UA: NEGATIVE
GLUCOSE UA: NEGATIVE
KETONES UA: NEGATIVE
Leukocytes, UA: NEGATIVE
NITRITE UA: NEGATIVE
Protein, UA: NEGATIVE
RBC UA: NEGATIVE
Spec Grav, UA: 1.015
Urobilinogen, UA: 0.2
pH, UA: 6.5

## 2014-05-14 LAB — LIPID PANEL
CHOLESTEROL: 157 mg/dL (ref 0–200)
HDL: 47.2 mg/dL (ref >39.00)
LDL CALC: 94 mg/dL (ref 0–99)
TRIGLYCERIDES: 78 mg/dL (ref 0.0–149.0)
Total CHOL/HDL Ratio: 3
VLDL: 15.6 mg/dL (ref 0.0–40.0)

## 2014-05-14 LAB — PSA: PSA: 2.91 ng/mL (ref 0.10–4.00)

## 2014-05-14 LAB — TSH: TSH: 2.66 u[IU]/mL (ref 0.35–4.50)

## 2014-05-16 ENCOUNTER — Encounter: Payer: Self-pay | Admitting: Internal Medicine

## 2014-05-17 ENCOUNTER — Encounter: Payer: Self-pay | Admitting: Internal Medicine

## 2014-05-24 ENCOUNTER — Encounter: Payer: BC Managed Care – PPO | Admitting: Internal Medicine

## 2014-05-29 ENCOUNTER — Ambulatory Visit (INDEPENDENT_AMBULATORY_CARE_PROVIDER_SITE_OTHER): Payer: 59 | Admitting: Internal Medicine

## 2014-05-29 ENCOUNTER — Encounter: Payer: Self-pay | Admitting: Internal Medicine

## 2014-05-29 VITALS — BP 124/82 | HR 76 | Temp 98.7°F | Ht 68.5 in | Wt 184.0 lb

## 2014-05-29 DIAGNOSIS — Z0000 Encounter for general adult medical examination without abnormal findings: Secondary | ICD-10-CM

## 2014-05-29 NOTE — Progress Notes (Signed)
Subjective:    Patient ID: Patrick [name], male    DOB: 07/30/1956, 58 y.o.   MRN: [id number]  HPI    Review of Systems  Constitutional: Negative for fever and fatigue.  HENT: Negative for congestion, hearing loss and postnasal drip.   Eyes: Negative for discharge, redness and visual disturbance.  Respiratory: Negative for cough, shortness of breath and wheezing.   Cardiovascular: Negative for leg swelling.  Gastrointestinal: Negative for abdominal pain, constipation and abdominal distention.  Genitourinary: Negative for urgency and frequency.  Musculoskeletal: Negative for arthralgias, joint swelling and neck pain.  Skin: Negative for color change and rash.  Neurological: Negative for weakness and light-headedness.  Hematological: Negative for adenopathy.  Psychiatric/Behavioral: Negative for behavioral problems.   Past Medical History  Diagnosis Date  . Hyperlipidemia     History   Social History  . Marital Status: Married    Spouse Name: N/A    Number of Children: N/A  . Years of Education: N/A   Occupational History  . [id number]  . 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 Smoking status: Never Smoker   . Smokeless tobacco: Never Used  . Alcohol Use: Yes     Comment: occasional  . Drug Use: No  . Sexual Activity: Not on file   Other Topics Concern  . Not on file   Social History Narrative  . No narrative on file    Past Surgical History  Procedure Laterality Date  . A/c repair right  years ago  . Knee arthroscopy  11/15/2012    Procedure: ARTHROSCOPY KNEE;  Surgeon: Gearlean Alf, MD;  Location: [facility];  Service: Orthopedics;  Laterality: Right;  Right Knee Arthroscopy with Debridement    Family History  Problem Relation Age of Onset  . Diabetes Mother   . Cancer Mother 57    lung cancer  . Heart disease Father   . Heart disease Brother   . Prostate cancer Brother     No Known  Allergies  Current Outpatient Prescriptions on File Prior to Visit  Medication Sig Dispense Refill  . aspirin 81 MG tablet Take 81 mg by mouth daily.       . rosuvastatin (CRESTOR) 20 MG tablet Take 20 mg by mouth 2 (two) times a week. Wednesday and sunday       No current facility-administered medications on file prior to visit.    BP 124/82  Pulse 76  Temp(Src) 98.7 F (37.1 C) (Oral)  Ht 5' 8.5" (1.74 m)  Wt 184 lb (83.462 kg)  BMI 27.57 kg/m2       Objective:   Physical Exam  Nursing note and vitals reviewed. Constitutional: He is oriented to person, place, and time. He appears well-developed and well-nourished.  HENT:  Head: Normocephalic and atraumatic.  Eyes: Conjunctivae are normal. Pupils are equal, round, and reactive to light.  Neck: Normal range of motion. Neck supple.  Cardiovascular: Normal rate and regular rhythm.   Pulmonary/Chest: Effort normal and breath sounds normal.  Abdominal: Soft. Bowel sounds are normal.  Genitourinary: Rectum normal and prostate normal.  Musculoskeletal: Normal range of motion.  Neurological: He is alert and oriented to person, place, and time.  Skin: Skin is warm and dry.  Psychiatric: He has a normal mood and affect. His behavior is normal.          Assessment & Plan:  Patient presents for yearly preventative medicine examination. ROS questionnaire was  completed  All immunizations and health maintenance protocols were reviewed with the patient and needed orders were placed.  Appropriate screening laboratory values were ordered for the patient including screening of hyperlipidemia, renal function and hepatic function. If indicated by BPH, a PSA was ordered.  Medication reconciliation,  past medical history, social history, problem list and allergies were reviewed in detail with the patient  Goals were established with regard to weight loss, exercise, and  diet in compliance with medications  End of life planning was  discussed.  Great labs

## 2014-05-29 NOTE — Patient Instructions (Signed)
The patient is instructed to continue all medications as prescribed. Schedule followup with check out clerk upon leaving the clinic Call in Fall for Appointment with Dr Yong Channel for 1 year CPX  For sick visits  See Catalina Antigua our new PA

## 2014-05-29 NOTE — Progress Notes (Signed)
Pre visit review using our clinic review tool, if applicable. No additional management support is needed unless otherwise documented below in the visit note. 

## 2014-05-30 ENCOUNTER — Encounter: Payer: Self-pay | Admitting: Internal Medicine

## 2014-05-30 MED ORDER — ROSUVASTATIN CALCIUM 20 MG PO TABS: 20.0000 mg | ORAL_TABLET | ORAL | Status: DC

## 2014-08-08 ENCOUNTER — Encounter: Payer: Self-pay | Admitting: Gastroenterology

## 2014-10-14 ENCOUNTER — Ambulatory Visit (INDEPENDENT_AMBULATORY_CARE_PROVIDER_SITE_OTHER): Payer: PRIVATE HEALTH INSURANCE | Admitting: Family Medicine

## 2014-10-14 ENCOUNTER — Encounter: Payer: Self-pay | Admitting: Family Medicine

## 2014-10-14 VITALS — BP 130/90 | Temp 98.6°F | Wt 185.0 lb

## 2014-10-14 DIAGNOSIS — Z23 Encounter for immunization: Secondary | ICD-10-CM

## 2014-10-14 DIAGNOSIS — Z87442 Personal history of urinary calculi: Secondary | ICD-10-CM

## 2014-10-14 DIAGNOSIS — E785 Hyperlipidemia, unspecified: Secondary | ICD-10-CM

## 2014-10-14 MED ORDER — ROSUVASTATIN CALCIUM 20 MG PO TABS: 20.0000 mg | ORAL_TABLET | ORAL | Status: DC

## 2014-10-14 NOTE — Assessment & Plan Note (Signed)
Well-controlled. Continue Crestor 20 mg twice a week. Refilled

## 2014-10-14 NOTE — Progress Notes (Signed)
  Patrick Reddish, MD Phone: [phone]  Subjective:  Patient presents today to establish care with me as their new primary care provider. Patient was formerly a patient of Dr. [name]. Chief complaint-noted.   Hyperlipidemia-well-controlled Lab Results  Component Value Date   LDLCALC 94 05/14/2014  On statin: Crestor 20 mg twice a week Regular exercise: Walking 5x a week for 30 minutes to an hour ROS- no chest pain or shortness of breath. No myalgias  The following were reviewed and entered/updated in epic: Past Medical History  Diagnosis Date  . Hyperlipidemia   . 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 Hyperlipidemia 02/28/2008    Priority: Medium   Past Surgical History  Procedure Laterality Date  . A/c repair right  years ago    [employer] football injury  . Knee arthroscopy  11/15/2012    meniscus repair- Dr. [name], right    Family History  Problem Relation Age of Onset  . Diabetes Mother   . Cancer Mother 39    lung cancer-nonsmoker  . Heart disease Father     6s heavy smoker  . Heart disease Brother   . Prostate cancer Brother 73  . Hyperlipidemia Father   . Prostate cancer Father     Medications- reviewed and updated Current Outpatient Prescriptions  Medication Sig Dispense Refill  . aspirin 81 MG tablet Take 81 mg by mouth daily.       . rosuvastatin (CRESTOR) 20 MG tablet Take 1 tablet (20 mg total) by mouth 2 (two) times a week. Wednesday and sunday  60 tablet  1   No current facility-administered medications for this visit.    Allergies-reviewed and updated No Known Allergies  History   Social History  . Marital Status: Married    Spouse Name: N/A    Number of Children: N/A  . Years of Education: N/A   Occupational History  . [phone]  . 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 Smoking  status: Never Smoker   . Smokeless tobacco: Never Used  . Alcohol Use: Yes     Comment: occasional  . Drug Use: No  . Sexual Activity: None   Other Topics Concern  . None   Social History Narrative   Married 1979. 2 kids. No grandkids.       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  Ext: no edema 2+ PT pulses Skin: warm, dry Neuro: grossly normal, moves all extremities, PERRLA  Assessment/Plan:  Hyperlipidemia Well-controlled. Continue Crestor 20 mg twice a week. Refilled   I did mention the patient that his diastolic is mildly elevated. Cannot diagnose hypertension with only 1 reading. We'll continue to follow closely  Meds ordered this encounter  Medications  . rosuvastatin (CRESTOR) 20 MG tablet    Sig: Take 1 tablet (20 mg total) by mouth 2 (two) times a week. Wednesday and sunday    Dispense:  60 tablet    Refill:  1

## 2014-10-14 NOTE — Patient Instructions (Signed)
Great to meet you!   Flu shot today.   Blood pressure bottom # slightly up. Recheck at your physical-go ahead and schedule today (get bloodwork a week before).  Keep up the great job exercising and we will see how blood pressure is at next visit.   Refilled Crestor for #60 and 1 refill.

## 2014-12-30 ENCOUNTER — Encounter: Payer: Self-pay | Admitting: Family Medicine

## 2014-12-31 ENCOUNTER — Ambulatory Visit (INDEPENDENT_AMBULATORY_CARE_PROVIDER_SITE_OTHER): Payer: PRIVATE HEALTH INSURANCE | Admitting: Family Medicine

## 2014-12-31 ENCOUNTER — Encounter: Payer: Self-pay | Admitting: Family Medicine

## 2014-12-31 VITALS — BP 140/74 | Temp 98.4°F | Wt 186.0 lb

## 2014-12-31 DIAGNOSIS — M79662 Pain in left lower leg: Secondary | ICD-10-CM

## 2014-12-31 NOTE — Patient Instructions (Signed)
We are going to get you into Dr. Charlann Boxer of Sports Medicine.   While you are waiting on appointment, want you to ice the area 3x a day for 15 minutes. Hold off on aleve unless pain severe. Continue relative rest-no exercise with walking for now. Just walking to get to where you need to go.

## 2014-12-31 NOTE — Progress Notes (Signed)
  Garret Reddish, MD Phone: [phone]  Subjective:   Patrick [name] is a 59 y.o. year old very pleasant male patient who presents with the following:  Left Shin pain Started Thursday when went on 5 miles walk. Walking 7 days for an hour over last few weeks after getting fit bit previously 5 days a week for 30 minutes. Walking faster at times. About halfway through, started having pain on left anterior shin 2-3 inches above ankle.  COntinued to have pain at night. Rested next day and pain continued. Tried to walk off the next day. Sunday became red and worsening pain and also noted swelling in area. 6/10 burning or bruise like feeling. Did not hit the leg or have any falls. Started ibuprofen on Sunday along with icing it. Icing seems to help the most. Pain only on one side.   ROS- no numbness/tingling into feet. No fevers/chills.   Past Medical History- Patient Active Problem List   Diagnosis Date Noted  . Hyperlipidemia 02/28/2008    Priority: Medium   Medications- reviewed and updated Current Outpatient Prescriptions  Medication Sig Dispense Refill  . aspirin 81 MG tablet Take 81 mg by mouth daily.     . rosuvastatin (CRESTOR) 20 MG tablet Take 1 tablet (20 mg total) by mouth 2 (two) times a week. Wednesday and sunday 60 tablet 1   No current facility-administered medications for this visit.   Objective: BP 140/74 mmHg  Temp(Src) 98.4 F (36.9 C)  Wt 186 lb (84.369 kg) Gen: NAD, resting comfortably  MSK: Slight edema over bilateral shins On left lower shin has some increased edema Tender to palpation over 2-3 cm area of his left shin.  Full ROM at foot. Pain with dorsiflexion (worse than plantar flexion) and plantar flexion in area palpated above. 5/5 strength today with dorsiflexion, plantar flexion, adduction, abduction, inversion, eversion With fingers over palpated area with dorsiflexion, plantar flexion there is squeaking/rubbing sensation.  REsisted dorsiflexion and  plantar flexion worsen pain but not to the degree of repetitive dorsiflexion and plantar flexion or palpation over area.    Assessment/Plan:  Left Shin pain Patient had sent me a message concerned about stress fracture. This is certainly in differential but given pain worse with dorsiflexion and the rubbing/squeaking sensation I am more concerned for tendonitis such as anterior tibial tendonitis. We discussed that x-ray would be of limited use if this was a stress fracture as may not show for several weeks and only about 5 days out from start of pain. Discussed this likely would not require surgery regardless and that sports medicine would likely be best option. Decision made to send patient to sports medicine and has appointment Friday. I doubt this is a stress fracture but asked patient to avoid NSAIDs until evaluation. Advised relative rest, icing 3x a day.   Return precautions advised.

## 2015-01-02 ENCOUNTER — Other Ambulatory Visit (INDEPENDENT_AMBULATORY_CARE_PROVIDER_SITE_OTHER): Payer: PRIVATE HEALTH INSURANCE

## 2015-01-02 ENCOUNTER — Ambulatory Visit (INDEPENDENT_AMBULATORY_CARE_PROVIDER_SITE_OTHER): Payer: PRIVATE HEALTH INSURANCE | Admitting: Family Medicine

## 2015-01-02 ENCOUNTER — Encounter: Payer: Self-pay | Admitting: Family Medicine

## 2015-01-02 VITALS — BP 142/78 | HR 59 | Ht 68.0 in | Wt 187.0 lb

## 2015-01-02 DIAGNOSIS — M79605 Pain in left leg: Secondary | ICD-10-CM

## 2015-01-02 DIAGNOSIS — M76812 Anterior tibial syndrome, left leg: Secondary | ICD-10-CM

## 2015-01-02 NOTE — Patient Instructions (Signed)
Good to meet you Ice 20 minutes 2 times daily. Usually after activity and before bed. Exercises 3 times a week.  Vitamin D 2000 IU daily.  Pennsaid twice daily for 5 days then as needed Compression sleeve with exercises and probably at the convention.  New shoes like new balance.  See me again in 3 weeks if pain not gone.

## 2015-01-02 NOTE — Progress Notes (Signed)
[facility] [place], [place] [address] Phone: [phone] Subjective:    I'm seeing this patient by the request  of:  Garret Reddish, MD   CC: Left shin pain  KGY:[id number] Patrick [name] is a 59 y.o. male coming in with complaint of left shin pain. Patient had tried to increase his walking. Patient has been walking 5 days a week for approximately 30 minutes. Patient has been trying to increase his intensity of is walking as well. Patient site having left anterior shin pain. Patient points to an area that is approximately 2-3 cm above his ankle. Patient states during walking it did not seem as bad but was having more pain at night. Patient then woke up the next morning and continued to have pain. Patient states 2 days after the initial incident he started noticing some redness and worsening pain in the area. Patient rates the severity of 6 out of 10. States that it feels more of a dull aching sensation. It is been getting better over time.       Past medical history, social, surgical and family history all reviewed in electronic medical record.   Review of Systems: No headache, visual changes, nausea, vomiting, diarrhea, constipation, dizziness, abdominal pain, skin rash, fevers, chills, night sweats, weight loss, swollen lymph nodes, body aches, joint swelling, muscle aches, chest pain, shortness of breath, mood changes.   Objective Blood pressure 142/78, pulse 59, height 5\' 8"  (1.727 m), weight 187 lb (84.823 kg), SpO2 97 %.  General: No apparent distress alert and oriented x3 mood and affect normal, dressed appropriately.  HEENT: Pupils equal, extraocular movements intact  Respiratory: Patient's speak in full sentences and does not appear short of breath  Cardiovascular: No lower extremity edema, non tender, no erythema  Skin: Warm dry intact with no signs of infection or rash on extremities or on axial skeleton.  Abdomen: Soft nontender    Neuro: Cranial nerves II through XII are intact, neurovascularly intact in all extremities with 2+ DTRs and 2+ pulses.  Lymph: No lymphadenopathy of posterior or anterior cervical chain or axillae bilaterally.  Gait normal with good balance and coordination.  MSK:  Non tender with full range of motion and good stability and symmetric strength and tone of shoulders, elbows, wrist, hip, knees bilaterally.   Ankle:Left  No visible erythema or swelling. Range of motion is full in all directions. Mild discomfort with dorsal midway.  Strength is 5/5 in all directions. Stable lateral and medial ligaments; squeeze test and kleiger test unremarkable; Talar dome nontender; No pain at base of 5th MT; No tenderness over cuboid; No tenderness over N spot or navicular prominence No tenderness on posterior aspects of lateral and medial malleolus No sign of peroneal tendon subluxations or tenderness to palpation Negative tarsal tunnel tinel's Able to walk 4 steps. Contralateral ankle unremarkable  MSK US performed of: left This study was ordered, performed, and interpreted by Charlann Boxer D.O.  Foot/Ankle:   All structures visualized.   Talar dome unremarkable  Ankle mortise without effusion. Peroneus longus and brevis tendons unremarkable on long and transverse views without sheath effusions. Posterior tibialis, flexor hallucis longus, and flexor digitorum longus tendons unremarkable on long and transverse views without sheath effusions. Achilles tendon visualized along length of tendon and unremarkable on long and transverse views without sheath effusion. Anterior Talofibular Ligament and Calcaneofibular Ligaments unremarkable and intact. Anterior tibialis tendon does have some inflammation in the area. No true tear  appreciated. Increasing Doppler flow noted. Deltoid Ligament unremarkable and intact. Plantar fascia intact and without effusion, normal thickness. No increased doppler signal, cap  sign, or thickening of tibial cortex.  IMPRESSION:  Mild anterior tibialis tendonitis  Procedure note 36629; 15 minutes spent for Therapeutic exercises as stated in above notes.  This included exercises focusing on stretching, strengthening, with significant focus on eccentric aspects.   Proper technique shown and discussed handout in great detail with ATC.  All questions were discussed and answered.      Impression and Recommendations:     This case required medical decision making of moderate complexity.

## 2015-01-02 NOTE — Assessment & Plan Note (Signed)
Patient does have some findings of anterior tibialis tendinitis but this seems to be mild overall. Patient try topical anti-inflammatory, icing protocol, compression sleeve and was given home exercises. Patient spent extra time with the athletic trainer today. Differential also includes an anterior compartment syndrome the patient did not have any numbness or any foot drop. We will monitor though if this does not make any significant improvement. We discussed what activities to avoid in proper shoe wear. Patient will try to make these changes and come back and see me again in 3 weeks if pain persists.

## 2015-01-02 NOTE — Progress Notes (Signed)
Pre visit review using our clinic review tool, if applicable. No additional management support is needed unless otherwise documented below in the visit note. 

## 2015-01-13 ENCOUNTER — Encounter: Payer: Self-pay | Admitting: Family Medicine

## 2015-01-24 ENCOUNTER — Ambulatory Visit: Payer: No Typology Code available for payment source | Admitting: Family Medicine

## 2015-05-20 ENCOUNTER — Encounter: Payer: Self-pay | Admitting: Family Medicine

## 2015-05-26 ENCOUNTER — Other Ambulatory Visit (INDEPENDENT_AMBULATORY_CARE_PROVIDER_SITE_OTHER): Payer: PRIVATE HEALTH INSURANCE

## 2015-05-26 DIAGNOSIS — Z0000 Encounter for general adult medical examination without abnormal findings: Secondary | ICD-10-CM

## 2015-05-26 LAB — POCT URINALYSIS DIPSTICK
BILIRUBIN UA: NEGATIVE
Blood, UA: NEGATIVE
GLUCOSE UA: NEGATIVE
KETONES UA: NEGATIVE
Leukocytes, UA: NEGATIVE
NITRITE UA: NEGATIVE
Protein, UA: NEGATIVE
SPEC GRAV UA: 1.02
UROBILINOGEN UA: 0.2
pH, UA: 6

## 2015-05-26 LAB — TSH: TSH: 2.72 u[IU]/mL (ref 0.35–4.50)

## 2015-05-26 LAB — BASIC METABOLIC PANEL
BUN: 15 mg/dL (ref 6–23)
CO2: 28 meq/L (ref 19–32)
CREATININE: 0.84 mg/dL (ref 0.40–1.50)
Calcium: 9.1 mg/dL (ref 8.4–10.5)
Chloride: 106 mEq/L (ref 96–112)
GFR: 99.64 mL/min (ref >60.00)
GLUCOSE: 97 mg/dL (ref 70–99)
Potassium: 4.6 mEq/L (ref 3.5–5.1)
Sodium: 140 mEq/L (ref 135–145)

## 2015-05-26 LAB — CBC WITH DIFFERENTIAL/PLATELET
BASOS ABS: 0 10*3/uL (ref 0.0–0.1)
BASOS PCT: 0.5 % (ref 0.0–3.0)
EOS ABS: 0.1 10*3/uL (ref 0.0–0.7)
Eosinophils Relative: 2.1 % (ref 0.0–5.0)
HEMATOCRIT: 43.6 % (ref 39.0–52.0)
HEMOGLOBIN: 14.8 g/dL (ref 13.0–17.0)
LYMPHS ABS: 1.3 10*3/uL (ref 0.7–4.0)
LYMPHS PCT: 24.8 % (ref 12.0–46.0)
MCHC: 33.9 g/dL (ref 30.0–36.0)
MCV: 91.2 fl (ref 78.0–100.0)
MONO ABS: 0.5 10*3/uL (ref 0.1–1.0)
MONOS PCT: 9 % (ref 3.0–12.0)
NEUTROS ABS: 3.2 10*3/uL (ref 1.4–7.7)
NEUTROS PCT: 63.6 % (ref 43.0–77.0)
PLATELETS: 196 10*3/uL (ref 150.0–400.0)
RBC: 4.78 Mil/uL (ref 4.22–5.81)
RDW: 12.8 % (ref 11.5–15.5)
WBC: 5.1 10*3/uL (ref 4.0–10.5)

## 2015-05-26 LAB — HEPATIC FUNCTION PANEL
ALT: 27 U/L (ref 0–53)
AST: 19 U/L (ref 0–37)
Albumin: 4.2 g/dL (ref 3.5–5.2)
Alkaline Phosphatase: 58 U/L (ref 39–117)
BILIRUBIN DIRECT: 0.1 mg/dL (ref 0.0–0.3)
BILIRUBIN TOTAL: 0.6 mg/dL (ref 0.2–1.2)
Total Protein: 6.5 g/dL (ref 6.0–8.3)

## 2015-05-26 LAB — LIPID PANEL
Cholesterol: 187 mg/dL (ref 0–200)
HDL: 41.7 mg/dL (ref >39.00)
LDL CALC: 119 mg/dL — AB (ref 0–99)
NONHDL: 145.3
TRIGLYCERIDES: 133 mg/dL (ref 0.0–149.0)
Total CHOL/HDL Ratio: 4
VLDL: 26.6 mg/dL (ref 0.0–40.0)

## 2015-05-26 LAB — PSA: PSA: 3.95 ng/mL (ref 0.10–4.00)

## 2015-06-09 ENCOUNTER — Ambulatory Visit (INDEPENDENT_AMBULATORY_CARE_PROVIDER_SITE_OTHER): Payer: PRIVATE HEALTH INSURANCE | Admitting: Family Medicine

## 2015-06-09 ENCOUNTER — Encounter: Payer: No Typology Code available for payment source | Admitting: Family Medicine

## 2015-06-09 ENCOUNTER — Encounter: Payer: Self-pay | Admitting: Family Medicine

## 2015-06-09 VITALS — BP 140/90 | HR 64 | Temp 98.1°F | Ht 68.0 in | Wt 186.0 lb

## 2015-06-09 DIAGNOSIS — Z0000 Encounter for general adult medical examination without abnormal findings: Secondary | ICD-10-CM

## 2015-06-09 DIAGNOSIS — Z23 Encounter for immunization: Secondary | ICD-10-CM

## 2015-06-09 DIAGNOSIS — I10 Essential (primary) hypertension: Secondary | ICD-10-CM

## 2015-06-09 DIAGNOSIS — E785 Hyperlipidemia, unspecified: Secondary | ICD-10-CM

## 2015-06-09 DIAGNOSIS — R972 Elevated prostate specific antigen [PSA]: Secondary | ICD-10-CM

## 2015-06-09 DIAGNOSIS — Z7251 High risk heterosexual behavior: Secondary | ICD-10-CM

## 2015-06-09 DIAGNOSIS — M720 Palmar fascial fibromatosis [Dupuytren]: Secondary | ICD-10-CM

## 2015-06-09 NOTE — Progress Notes (Signed)
Patrick Reddish, MD Phone: [phone]  Subjective:  Patient presents today for their annual physical. Chief complaint-noted.   psa elevation 1 point in a yaer- long discussion  bp elevated.  Willing to do Home monitoring  Needs HIV  testing  Agrees to Tdap  ROS- full  review of systems was completed and negative  Pertinent negative: no dysuria, polyuria, rectal pain No chest pain, shortness of breath, myalgias  The following were reviewed and entered/updated in epic: Past Medical History  Diagnosis Date  . Hyperlipidemia   . 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 Essential hypertension 06/09/2015    Priority: Medium  . Hyperlipidemia 02/28/2008    Priority: Medium  . Anterior tibialis tendinitis of left leg 01/02/2015   Past Surgical History  Procedure Laterality Date  . A/c repair right  years ago    HS football injury  . Knee arthroscopy  11/15/2012    meniscus repair- Dr. [name], right    Family History  Problem Relation Age of Onset  . Diabetes Mother   . Cancer Mother 51    lung cancer-nonsmoker  . Heart disease Father     8s heavy smoker  . Heart disease Brother   . Prostate cancer Father     76s or 59s  . Hyperlipidemia Father     Medications- reviewed and updated Current Outpatient Prescriptions  Medication Sig Dispense Refill  . aspirin 81 MG tablet Take 81 mg by mouth daily.     . rosuvastatin (CRESTOR) 20 MG tablet Take 1 tablet (20 mg total) by mouth 2 (two) times a week. Wednesday and sunday 60 tablet 1   No current facility-administered medications for this visit.    Allergies-reviewed and updated No Known Allergies  History   Social History  . Marital Status: Married    Spouse Name: N/A  . Number of Children: N/A  . Years of Education: N/A   Occupational History  . [phone]  . [employer]MENT SALES    Social History Main Topics  . Smoking status: Never Smoker   . Smokeless tobacco: Never Used  . Alcohol Use: Yes     Comment: occasional  . Drug Use: No  . Sexual Activity: Not on file   Other Topics Concern  . None   Social History Narrative   Married 1979. 2 kids. No grandkids.       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 Oropharynx normal Neck: no thyromegaly CV: RRR no murmurs rubs or gallops Lungs: CTAB no crackles, wheeze, rhonchi Abdomen: soft/nontender/nondistended/normal bowel sounds. No rebound or guarding.  Rectal: normal tone, diffusely enlarged prostate, no masses or tenderness Ext: no edema Skin: warm, dry Neuro: grossly normal, moves all extremities, PERRLA   Assessment/Plan:  59 y.o. male presenting for annual physical.  Health Maintenance counseling: 1. Anticipatory guidance: Patient counseled regarding regular dental exams, wearing seatbelts, wearing sunscreen, regular eye exams. Sees dermatology yearly.  2. Risk factor reduction:  Advised patient of need for regular exercise (lacking recently  due to worse)j and diet rich and fruits and vegetables to reduce risk of heart attack and stroke.  3. Immunizations/screenings/ancillary studies Health Maintenance Due  Topic Date Due  . HIV Screening - next labs [birth year]1972   S: PSA up to 3.95 from 2.91 a yaer prior.  A/P: no obvious signs infection on exam. We discussed referral vs active monitoring and patient opts for 2nd. He will return in 2-3 months for PSA and depending on trend, may refer to urology  Hyperlipidemia S: compliant with Crestor 20mg  twice a week. Previously  controlled but today LDL up to 119 despite no change in meds.  A/P: We discussed increased cardiac risk likely with ldl over 100 but with primary prevention only, we opted not to increase dose.    Essential hypertension S: 3 consecutive SBP elevations. Asymptomatic.  A/P: we discussed BP meds but patient strongly opposed. He agrees to trial DASH diet and upping exercise but admits this is difficult with work and need for family time. He will monitor at home and update me when he comes in for repeat labs in regards to PSA>     S: Dupuytren's contracture left hand. Present for years, seems to have a hard time extending left 4th finger as far back as other fingers A/P: discussed hand surgery referral and patient agrees-referred per below  labs 2-3 months, update BP 2-3 months. Advised in office 3-6 months.   Orders Placed This Encounter  Procedures  . Tdap vaccine greater than or equal to 7yo IM  . PSA    Standing Status: Future     Number of Occurrences:      Standing Expiration Date: 06/08/2016  . HIV antibody    [facility]    Standing Status: Future     Number of Occurrences:      Standing Expiration Date: 06/08/2016  . Ambulatory referral to Hand Surgery    Referral Priority:  Routine    Referral Type:  Surgical    Referral Reason:  Specialty Services Required    Requested Specialty:  Hand Surgery    Number of Visits Requested:  1

## 2015-06-09 NOTE — Assessment & Plan Note (Signed)
S: compliant with Crestor 20mg  twice a week. Previously controlled but today LDL up to 119 despite no change in meds.  A/P: We discussed increased cardiac risk likely with ldl over 100 but with primary prevention only, we opted not to increase dose.

## 2015-06-09 NOTE — Assessment & Plan Note (Addendum)
S: 3 consecutive SBP elevations. Asymptomatic.  A/P: we discussed BP meds but patient strongly opposed. He agrees to trial DASH diet and upping exercise but admits this is difficult with work and need for family time. He will monitor at home and update me when he comes in for repeat labs in regards to PSA>

## 2015-06-09 NOTE — Patient Instructions (Signed)
Tdap today-tetanus and whooping cough  We will call you within a week about your referral to hand surgery. If you do not hear within 2 weeks, give [place] a call.   Return in 2-3 months for repeat PSA. National screening recs-also check HIV that time  Get a home blood pressure cuff. Monitor at least once a week and bring a log to next visit. See DASH eating plan and work towards 150 minutes a week exercise..  L hand 4th finger - duputren's ortho hand referral  DASH Eating Plan DASH stands for "Dietary Approaches to Stop Hypertension." The DASH eating plan is a healthy eating plan that has been shown to reduce high blood pressure (hypertension). Additional health benefits may include reducing the risk of type 2 diabetes mellitus, heart disease, and stroke. The DASH eating plan may also help with weight loss. WHAT DO I NEED TO KNOW ABOUT THE DASH EATING PLAN? For the DASH eating plan, you will follow these general guidelines:  Choose foods with a percent daily value for sodium of less than 5% (as listed on the food label).  Use salt-free seasonings or herbs instead of table salt or sea salt.  Check with your health care provider or pharmacist before using salt substitutes.  Eat lower-sodium products, often labeled as "lower sodium" or "no salt added."  Eat fresh foods.  Eat more vegetables, fruits, and low-fat dairy products.  Choose whole grains. Look for the word "whole" as the first word in the ingredient list.  Choose fish and skinless chicken or [place] more often than red meat. Limit fish, poultry, and meat to 6 oz (170 g) each day.  Limit sweets, desserts, sugars, and sugary drinks.  Choose heart-healthy fats.  Limit cheese to 1 oz (28 g) per day.  Eat more home-cooked food and less restaurant, buffet, and fast food.  Limit fried foods.  Cook foods using methods other than frying.  Limit canned vegetables. If you do use them, rinse them well to decrease the sodium.  When  eating at a restaurant, ask that your food be prepared with less salt, or no salt if possible. WHAT FOODS CAN I EAT? Seek help from a dietitian for individual calorie needs. Grains Whole grain or whole wheat bread. Brown rice. Whole grain or whole wheat pasta. Quinoa, bulgur, and whole grain cereals. Low-sodium cereals. Corn or whole wheat flour tortillas. Whole grain cornbread. Whole grain crackers. Low-sodium crackers. Vegetables Fresh or frozen vegetables (raw, steamed, roasted, or grilled). Low-sodium or reduced-sodium tomato and vegetable juices. Low-sodium or reduced-sodium tomato sauce and paste. Low-sodium or reduced-sodium canned vegetables.  Fruits All fresh, canned (in natural juice), or frozen fruits. Meat and Other Protein Products Ground beef (85% or leaner), grass-fed beef, or beef trimmed of fat. Skinless chicken or [place]. Ground chicken or [place]. Pork trimmed of fat. All fish and seafood. Eggs. Dried beans, peas, or lentils. Unsalted nuts and seeds. Unsalted canned beans. Dairy Low-fat dairy products, such as skim or 1% milk, 2% or reduced-fat cheeses, low-fat ricotta or cottage cheese, or plain low-fat yogurt. Low-sodium or reduced-sodium cheeses. Fats and Oils Tub margarines without trans fats. Light or reduced-fat mayonnaise and salad dressings (reduced sodium). Avocado. Safflower, olive, or canola oils. Natural peanut or almond butter. Other Unsalted popcorn and pretzels. The items listed above may not be a complete list of recommended foods or beverages. Contact your dietitian for more options. WHAT FOODS ARE NOT RECOMMENDED? Grains White bread. White pasta. White rice. Refined cornbread. Bagels and  croissants. Crackers that contain trans fat. Vegetables Creamed or fried vegetables. Vegetables in a cheese sauce. Regular canned vegetables. Regular canned tomato sauce and paste. Regular tomato and vegetable juices. Fruits Dried fruits. Canned fruit in light or heavy  syrup. Fruit juice. Meat and Other Protein Products Fatty cuts of meat. Ribs, chicken wings, bacon, sausage, bologna, salami, chitterlings, fatback, hot dogs, bratwurst, and packaged luncheon meats. Salted nuts and seeds. Canned beans with salt. Dairy Whole or 2% milk, cream, half-and-half, and cream cheese. Whole-fat or sweetened yogurt. Full-fat cheeses or blue cheese. Nondairy creamers and whipped toppings. Processed cheese, cheese spreads, or cheese curds. Condiments Onion and garlic salt, seasoned salt, table salt, and sea salt. Canned and packaged gravies. Worcestershire sauce. Tartar sauce. Barbecue sauce. Teriyaki sauce. Soy sauce, including reduced sodium. Steak sauce. Fish sauce. Oyster sauce. Cocktail sauce. Horseradish. Ketchup and mustard. Meat flavorings and tenderizers. Bouillon cubes. Hot sauce. Tabasco sauce. Marinades. Taco seasonings. Relishes. Fats and Oils Butter, stick margarine, lard, shortening, ghee, and bacon fat. Coconut, palm kernel, or palm oils. Regular salad dressings. Other Pickles and olives. Salted popcorn and pretzels. The items listed above may not be a complete list of foods and beverages to avoid. Contact your dietitian for more information. WHERE CAN I FIND MORE INFORMATION? National Heart, Lung, and Blood Institute: travelstabloid.com Document Released: 11/25/2011 Document Revised: 04/22/2014 Document Reviewed: 10/10/2013 [facility] Patient Information 2015 [place], [place]. This information is not intended to replace advice given to you by your health care provider. Make sure you discuss any questions you have with your health care provider.

## 2015-08-18 ENCOUNTER — Encounter: Payer: Self-pay | Admitting: Family Medicine

## 2015-08-18 ENCOUNTER — Other Ambulatory Visit (INDEPENDENT_AMBULATORY_CARE_PROVIDER_SITE_OTHER): Payer: PRIVATE HEALTH INSURANCE

## 2015-08-18 DIAGNOSIS — Z7251 High risk heterosexual behavior: Secondary | ICD-10-CM

## 2015-08-18 DIAGNOSIS — R972 Elevated prostate specific antigen [PSA]: Secondary | ICD-10-CM

## 2015-08-18 LAB — PSA: PSA: 4.72 ng/mL — AB (ref 0.10–4.00)

## 2015-08-19 ENCOUNTER — Telehealth: Payer: Self-pay | Admitting: Family Medicine

## 2015-08-19 DIAGNOSIS — Z20828 Contact with and (suspected) exposure to other viral communicable diseases: Secondary | ICD-10-CM

## 2015-08-19 LAB — HIV ANTIBODY (ROUTINE TESTING W REFLEX): HIV: NONREACTIVE

## 2015-08-19 NOTE — Telephone Encounter (Signed)
Is this ok?

## 2015-08-19 NOTE — Telephone Encounter (Signed)
Pt would like to come for a hep c screening. Do we need an order for this.

## 2015-08-19 NOTE — Telephone Encounter (Signed)
I ordered those labs. Also ordered HIV. Make sure Jacquelynn Cree can see them. Thanks

## 2015-08-20 NOTE — Telephone Encounter (Signed)
Labs ordered, pt can schedule lab visit if he has not done so.

## 2015-08-20 NOTE — Telephone Encounter (Signed)
lmovm to call and schedule lab °

## 2015-08-22 ENCOUNTER — Ambulatory Visit (INDEPENDENT_AMBULATORY_CARE_PROVIDER_SITE_OTHER): Payer: PRIVATE HEALTH INSURANCE

## 2015-08-22 DIAGNOSIS — Z23 Encounter for immunization: Secondary | ICD-10-CM

## 2015-09-03 NOTE — Telephone Encounter (Signed)
Per national screening recommendations. Not a requirement.   [email or web address]

## 2015-09-03 NOTE — Telephone Encounter (Signed)
See below

## 2015-09-03 NOTE — Telephone Encounter (Signed)
Pt said hep c sreening showed up on his mychart and he asking why he would need to have a hep c screening

## 2015-09-04 NOTE — Telephone Encounter (Signed)
Called and lm on pt vm with below information. 

## 2015-09-19 ENCOUNTER — Encounter: Payer: Self-pay | Admitting: Family Medicine

## 2015-09-19 MED ORDER — ROSUVASTATIN CALCIUM 20 MG PO TABS: 20.0000 mg | ORAL_TABLET | Freq: Every day | ORAL | Status: DC

## 2016-03-01 ENCOUNTER — Encounter: Payer: Self-pay | Admitting: Family Medicine

## 2016-03-01 NOTE — Telephone Encounter (Signed)
Depends on his insurance- i would just enter referral and let him know we ordered it through [employer]

## 2016-03-02 ENCOUNTER — Encounter: Payer: Self-pay | Admitting: Family Medicine

## 2016-03-02 ENCOUNTER — Other Ambulatory Visit: Payer: Self-pay | Admitting: Family Medicine

## 2016-03-02 DIAGNOSIS — M25512 Pain in left shoulder: Secondary | ICD-10-CM

## 2016-03-04 ENCOUNTER — Other Ambulatory Visit: Payer: Self-pay | Admitting: Family Medicine

## 2016-03-04 ENCOUNTER — Encounter: Payer: Self-pay | Admitting: Family Medicine

## 2016-03-04 DIAGNOSIS — Z0000 Encounter for general adult medical examination without abnormal findings: Secondary | ICD-10-CM

## 2016-03-16 ENCOUNTER — Ambulatory Visit (INDEPENDENT_AMBULATORY_CARE_PROVIDER_SITE_OTHER): Payer: No Typology Code available for payment source | Admitting: Family Medicine

## 2016-03-16 ENCOUNTER — Other Ambulatory Visit (INDEPENDENT_AMBULATORY_CARE_PROVIDER_SITE_OTHER): Payer: No Typology Code available for payment source

## 2016-03-16 ENCOUNTER — Encounter: Payer: Self-pay | Admitting: Family Medicine

## 2016-03-16 ENCOUNTER — Ambulatory Visit (INDEPENDENT_AMBULATORY_CARE_PROVIDER_SITE_OTHER)
Admission: RE | Admit: 2016-03-16 | Discharge: 2016-03-16 | Disposition: A | Discharge status: Home / Self Care | Payer: No Typology Code available for payment source | Source: Ambulatory Visit | Attending: Family Medicine | Admitting: Family Medicine

## 2016-03-16 VITALS — BP 132/80 | HR 62 | Wt 185.0 lb

## 2016-03-16 DIAGNOSIS — M75102 Unspecified rotator cuff tear or rupture of left shoulder, not specified as traumatic: Secondary | ICD-10-CM | POA: Diagnosis not present

## 2016-03-16 DIAGNOSIS — M25512 Pain in left shoulder: Secondary | ICD-10-CM | POA: Diagnosis not present

## 2016-03-16 DIAGNOSIS — M129 Arthropathy, unspecified: Secondary | ICD-10-CM

## 2016-03-16 DIAGNOSIS — M19019 Primary osteoarthritis, unspecified shoulder: Secondary | ICD-10-CM | POA: Insufficient documentation

## 2016-03-16 MED ORDER — DICLOFENAC SODIUM 2 % TD SOLN: TRANSDERMAL | Status: DC

## 2016-03-16 NOTE — Assessment & Plan Note (Signed)
Patient does have what appears to be more of a rotator cuff syndrome. Discussed with patient at great length. Patient elected try conservative therapy. Topical anti-and clamped was given, icing protocol, home exercises given. Patient work with Product/process development scientist. We discussed which activities to avoid. Patient and will come back and see me again in 4 weeks. At that time if continuing have pain we'll consider injection. Differential also includes labral pathology that was not seen on the ultrasound. X-ray pending

## 2016-03-16 NOTE — Assessment & Plan Note (Signed)
Having worsening pain we'll need to consider injection.
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
## 2016-03-16 NOTE — Patient Instructions (Signed)
Good to see you.  Ice 20 minutes 2 times daily. Usually after activity and before bed. Exercises 3 times a week.  pennsaid pinkie amount topically 2 times daily as needed.  Vitamin D 2000 IU daily  Avoid heavy lifting for now and keep hands within peripheral vision.  See me again in 4 weeks and if not better we will consider injection and possible physical therapy.

## 2016-03-16 NOTE — Progress Notes (Signed)
Pre visit review using our clinic review tool, if applicable. No additional management support is needed unless otherwise documented below in the visit note. 

## 2016-04-13 ENCOUNTER — Ambulatory Visit: Payer: No Typology Code available for payment source | Admitting: Family Medicine

## 2016-06-08 ENCOUNTER — Other Ambulatory Visit (INDEPENDENT_AMBULATORY_CARE_PROVIDER_SITE_OTHER): Payer: No Typology Code available for payment source

## 2016-06-08 ENCOUNTER — Other Ambulatory Visit: Payer: Self-pay | Admitting: Family Medicine

## 2016-06-08 DIAGNOSIS — Z0000 Encounter for general adult medical examination without abnormal findings: Secondary | ICD-10-CM | POA: Diagnosis not present

## 2016-06-08 DIAGNOSIS — Z20828 Contact with and (suspected) exposure to other viral communicable diseases: Secondary | ICD-10-CM

## 2016-06-08 LAB — BASIC METABOLIC PANEL
BUN: 16 mg/dL (ref 6–23)
CHLORIDE: 104 meq/L (ref 96–112)
CO2: 26 meq/L (ref 19–32)
CREATININE: 0.79 mg/dL (ref 0.40–1.50)
Calcium: 9 mg/dL (ref 8.4–10.5)
GFR: 106.57 mL/min (ref >60.00)
Glucose, Bld: 103 mg/dL — ABNORMAL HIGH (ref 70–99)
POTASSIUM: 4.2 meq/L (ref 3.5–5.1)
Sodium: 138 mEq/L (ref 135–145)

## 2016-06-08 LAB — HEPATIC FUNCTION PANEL
ALBUMIN: 4.2 g/dL (ref 3.5–5.2)
ALT: 28 U/L (ref 0–53)
AST: 17 U/L (ref 0–37)
Alkaline Phosphatase: 52 U/L (ref 39–117)
BILIRUBIN TOTAL: 0.7 mg/dL (ref 0.2–1.2)
Bilirubin, Direct: 0.2 mg/dL (ref 0.0–0.3)
Total Protein: 6.5 g/dL (ref 6.0–8.3)

## 2016-06-08 LAB — CBC
HEMATOCRIT: 43 % (ref 39.0–52.0)
HEMOGLOBIN: 14.6 g/dL (ref 13.0–17.0)
MCHC: 34 g/dL (ref 30.0–36.0)
MCV: 90.6 fl (ref 78.0–100.0)
Platelets: 211 10*3/uL (ref 150.0–400.0)
RBC: 4.74 Mil/uL (ref 4.22–5.81)
RDW: 13 % (ref 11.5–15.5)
WBC: 4.6 10*3/uL (ref 4.0–10.5)

## 2016-06-08 LAB — TSH: TSH: 2.52 u[IU]/mL (ref 0.35–4.50)

## 2016-06-08 LAB — LIPID PANEL
Cholesterol: 178 mg/dL (ref 0–200)
HDL: 46.6 mg/dL (ref >39.00)
LDL Cholesterol: 117 mg/dL — ABNORMAL HIGH (ref 0–99)
NonHDL: 131.33
Total CHOL/HDL Ratio: 4
Triglycerides: 74 mg/dL (ref 0.0–149.0)
VLDL: 14.8 mg/dL (ref 0.0–40.0)

## 2016-06-09 LAB — HEPATITIS C ANTIBODY: HCV Ab: NEGATIVE

## 2016-06-09 LAB — HIV ANTIBODY (ROUTINE TESTING W REFLEX): HIV 1&2 Ab, 4th Generation: NONREACTIVE

## 2016-06-15 ENCOUNTER — Ambulatory Visit (INDEPENDENT_AMBULATORY_CARE_PROVIDER_SITE_OTHER): Payer: No Typology Code available for payment source | Admitting: Family Medicine

## 2016-06-15 ENCOUNTER — Encounter: Payer: Self-pay | Admitting: Family Medicine

## 2016-06-15 VITALS — BP 138/88 | HR 61 | Temp 98.1°F | Ht 67.75 in | Wt 186.0 lb

## 2016-06-15 DIAGNOSIS — Z85828 Personal history of other malignant neoplasm of skin: Secondary | ICD-10-CM | POA: Insufficient documentation

## 2016-06-15 DIAGNOSIS — Z0000 Encounter for general adult medical examination without abnormal findings: Secondary | ICD-10-CM | POA: Diagnosis not present

## 2016-06-15 NOTE — Patient Instructions (Signed)
I agree targetting a weight of 165-170 is very reasonable Some ideas:  5-9: fruits and vegetables per day  4: exercise 5 times per week for at least 30 minutes (walking counts!) 3: meals per day (don't skip breakfast!) 0: sugar sweetened beverages. I also generally advise avoiding diet drinks and falling in love with water  Blood pressure just below goal 140/90- would love for this to hangout about 10 points lower  Cholesterol above goal- weight loss can help  High Sugar- hesitant to increase cholesterol medicine as can further increase diabetes risk  DASH eating plan is a heart healthy diet that you can find more info online on  Also, send me some information in what you are reading about executive physical. Also check with insurance if you could do a physical in January again (do they allow one just per year in 2017, 2018, 2019 or does it literally have to be a year and a day apart)  DASH Eating Plan DASH stands for "Dietary Approaches to Stop Hypertension." The DASH eating plan is a healthy eating plan that has been shown to reduce high blood pressure (hypertension). Additional health benefits may include reducing the risk of type 2 diabetes mellitus, heart disease, and stroke. The DASH eating plan may also help with weight loss. WHAT DO I NEED TO KNOW ABOUT THE DASH EATING PLAN? For the DASH eating plan, you will follow these general guidelines:  Choose foods with a percent daily value for sodium of less than 5% (as listed on the food label).  Use salt-free seasonings or herbs instead of table salt or sea salt.  Check with your health care provider or pharmacist before using salt substitutes.  Eat lower-sodium products, often labeled as "lower sodium" or "no salt added."  Eat fresh foods.  Eat more vegetables, fruits, and low-fat dairy products.  Choose whole grains. Look for the word "whole" as the first word in the ingredient list.  Choose fish and skinless chicken or [place]  more often than red meat. Limit fish, poultry, and meat to 6 oz (170 g) each day.  Limit sweets, desserts, sugars, and sugary drinks.  Choose heart-healthy fats.  Limit cheese to 1 oz (28 g) per day.  Eat more home-cooked food and less restaurant, buffet, and fast food.  Limit fried foods.  Cook foods using methods other than frying.  Limit canned vegetables. If you do use them, rinse them well to decrease the sodium.  When eating at a restaurant, ask that your food be prepared with less salt, or no salt if possible. WHAT FOODS CAN I EAT? Seek help from a dietitian for individual calorie needs. Grains Whole grain or whole wheat bread. Brown rice. Whole grain or whole wheat pasta. Quinoa, bulgur, and whole grain cereals. Low-sodium cereals. Corn or whole wheat flour tortillas. Whole grain cornbread. Whole grain crackers. Low-sodium crackers. Vegetables Fresh or frozen vegetables (raw, steamed, roasted, or grilled). Low-sodium or reduced-sodium tomato and vegetable juices. Low-sodium or reduced-sodium tomato sauce and paste. Low-sodium or reduced-sodium canned vegetables.  Fruits All fresh, canned (in natural juice), or frozen fruits. Meat and Other Protein Products Ground beef (85% or leaner), grass-fed beef, or beef trimmed of fat. Skinless chicken or [place]. Ground chicken or [place]. Pork trimmed of fat. All fish and seafood. Eggs. Dried beans, peas, or lentils. Unsalted nuts and seeds. Unsalted canned beans. Dairy Low-fat dairy products, such as skim or 1% milk, 2% or reduced-fat cheeses, low-fat ricotta or cottage cheese, or plain low-fat  yogurt. Low-sodium or reduced-sodium cheeses. Fats and Oils Tub margarines without trans fats. Light or reduced-fat mayonnaise and salad dressings (reduced sodium). Avocado. Safflower, olive, or canola oils. Natural peanut or almond butter. Other Unsalted popcorn and pretzels. The items listed above may not be a complete list of recommended foods  or beverages. Contact your dietitian for more options. WHAT FOODS ARE NOT RECOMMENDED? Grains White bread. White pasta. White rice. Refined cornbread. Bagels and croissants. Crackers that contain trans fat. Vegetables Creamed or fried vegetables. Vegetables in a cheese sauce. Regular canned vegetables. Regular canned tomato sauce and paste. Regular tomato and vegetable juices. Fruits Dried fruits. Canned fruit in light or heavy syrup. Fruit juice. Meat and Other Protein Products Fatty cuts of meat. Ribs, chicken wings, bacon, sausage, bologna, salami, chitterlings, fatback, hot dogs, bratwurst, and packaged luncheon meats. Salted nuts and seeds. Canned beans with salt. Dairy Whole or 2% milk, cream, half-and-half, and cream cheese. Whole-fat or sweetened yogurt. Full-fat cheeses or blue cheese. Nondairy creamers and whipped toppings. Processed cheese, cheese spreads, or cheese curds. Condiments Onion and garlic salt, seasoned salt, table salt, and sea salt. Canned and packaged gravies. Worcestershire sauce. Tartar sauce. Barbecue sauce. Teriyaki sauce. Soy sauce, including reduced sodium. Steak sauce. Fish sauce. Oyster sauce. Cocktail sauce. Horseradish. Ketchup and mustard. Meat flavorings and tenderizers. Bouillon cubes. Hot sauce. Tabasco sauce. Marinades. Taco seasonings. Relishes. Fats and Oils Butter, stick margarine, lard, shortening, ghee, and bacon fat. Coconut, palm kernel, or palm oils. Regular salad dressings. Other Pickles and olives. Salted popcorn and pretzels. The items listed above may not be a complete list of foods and beverages to avoid. Contact your dietitian for more information. WHERE CAN I FIND MORE INFORMATION? National Heart, Lung, and Blood Institute: travelstabloid.com   This information is not intended to replace advice given to you by your health care provider. Make sure you discuss any questions you have with your health care  provider.   Document Released: 11/25/2011 Document Revised: 12/27/2014 Document Reviewed: 10/10/2013 Elsevier Interactive Patient Education [employer].

## 2016-06-15 NOTE — Progress Notes (Signed)
Pre visit review using our clinic review tool, if applicable. No additional management support is needed unless otherwise documented below in the visit note. 

## 2016-06-15 NOTE — Progress Notes (Signed)
Phone: [phone]  Subjective:  Patient presents today for their annual physical. Chief complaint-noted.   See problem oriented charting- ROS- full  review of systems was completed and negative including No chest pain or shortness of breath. No headache or blurry vision.   The following were reviewed and entered/updated in epic: Past Medical History  Diagnosis Date  . Hyperlipidemia   . 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 History of skin cancer 06/15/2016    Priority: Medium  . Essential hypertension 06/09/2015    Priority: Medium  . Hyperlipidemia 02/28/2008    Priority: Medium  . Rotator cuff syndrome of left shoulder 03/16/2016  . 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 Anterior tibialis tendinitis of left leg 01/02/2015   Past Surgical History  Procedure Laterality Date  . A/c repair right  years ago    HS football injury  . Knee arthroscopy  11/15/2012    meniscus repair- Dr. [name], right    Family History  Problem Relation Age of Onset  . Diabetes Mother   . Cancer Mother 11    lung cancer-nonsmoker  . Heart disease Father     28s heavy smoker  . Heart disease Brother   . Prostate cancer Father     34s or 59s  . Hyperlipidemia Father     Medications- reviewed and updated Current Outpatient Prescriptions  Medication Sig Dispense Refill  . aspirin 81 MG tablet Take 81 mg by mouth daily.     . Diclofenac Sodium 2 % SOLN Apply 1 pump twice daily. 112 g 3  . rosuvastatin (CRESTOR) 20 MG tablet Take 1 tablet (20 mg total) by mouth daily. 30 tablet 5   No current facility-administered medications for this visit.    Allergies-reviewed and updated No Known Allergies  Social History   Social History  . Marital Status: Married    Spouse Name: N/A  . Number of Children: N/A  . Years of Education: N/A   Occupational History  .  [id number]  . 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 Smoking status: Never Smoker   . Smokeless tobacco: Never Used  . Alcohol Use: Yes     Comment: occasional  . Drug Use: No  . Sexual Activity: Not Asked   Other Topics Concern  . None   Social History Narrative   Married 1979. 2 kids. No grandkids.       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 Oropharynx normal Neck: no thyromegaly, no carotid bruit CV: RRR no murmurs rubs or gallops Lungs: CTAB no crackles, wheeze, rhonchi Abdomen: soft/nontender/nondistended/normal bowel sounds. No rebound or guarding.  Ext: no edema Skin: warm, dry Neuro: grossly normal, moves all extremities, PERRLA  Rectal at urology Full skin at dermatology  Assessment/Plan:  60 y.o. male presenting for annual physical.  Health Maintenance counseling: 1. Anticipatory guidance: Patient counseled regarding regular dental exams, eye exams, wearing seatbelts.  2. Risk factor reduction:  Advised patient of need for regular exercise and  diet rich and fruits and vegetables to reduce risk of heart attack and stroke.  3. Immunizations/screenings/ancillary studies Immunization History  Administered Date(s) Administered  . Influenza Split 10/25/2011, 11/03/2012  . Influenza Whole 10/08/2008, 09/23/2009, 09/08/2010  . Influenza,inj,Quad PF,36+ Mos 11/09/2013, 10/14/2014, 08/22/2015  . Td 02/17/2005  . Tdap 06/09/2015  4. Prostate cancer screening- now followed by urology due to prior PSA trend up to over 4. In 04/2016 plan was 6 month repeat PSA and DRE given down to 4.11 and free psa  favorable for benign disease  Lab Results  Component Value Date   PSA 4.72* 08/18/2015   PSA 3.95 05/26/2015   PSA 2.91 05/14/2014   5. Colon cancer screening - 03/28/2008 with 10 year repeat. 1 polyp but hyperplastic 6. Skin cancer screening- Dermatologist at least yearly  Status of chronic or acute concerns  Hyperlipidemia- on crestor twice a week with LDL 117, see hyperglycemia  Hyperglycemia- CBG up to 103, at risk for diabetes, hesitant to increase statin further  HTN BP Readings from Last 3 Encounters:  06/15/16 138/88  03/16/16 132/80  06/09/15 140/90   Wt Readings from Last 3 Encounters:  06/15/16 186 lb (84.369 kg)  03/16/16 185 lb (83.915 kg)  06/09/15 186 lb (84.369 kg)   1 year CPE- asking about executive CPE before then perhaps January. He is going to look into what he is interested in.   Return precautions advised.   Garret Reddish, MD

## 2017-04-09 ENCOUNTER — Other Ambulatory Visit: Payer: Self-pay | Admitting: Family Medicine

## 2017-04-26 LAB — PSA: PSA: 4

## 2017-05-09 ENCOUNTER — Encounter: Payer: Self-pay | Admitting: Family Medicine

## 2017-05-13 ENCOUNTER — Encounter: Payer: Self-pay | Admitting: Family Medicine

## 2017-05-17 ENCOUNTER — Encounter: Payer: Self-pay | Admitting: Family Medicine

## 2017-05-17 ENCOUNTER — Other Ambulatory Visit: Payer: Self-pay

## 2017-05-17 DIAGNOSIS — R739 Hyperglycemia, unspecified: Secondary | ICD-10-CM

## 2017-05-17 DIAGNOSIS — E785 Hyperlipidemia, unspecified: Secondary | ICD-10-CM

## 2017-05-17 DIAGNOSIS — Z0000 Encounter for general adult medical examination without abnormal findings: Secondary | ICD-10-CM

## 2017-07-07 ENCOUNTER — Other Ambulatory Visit (INDEPENDENT_AMBULATORY_CARE_PROVIDER_SITE_OTHER): Payer: No Typology Code available for payment source

## 2017-07-07 DIAGNOSIS — E785 Hyperlipidemia, unspecified: Secondary | ICD-10-CM | POA: Diagnosis not present

## 2017-07-07 DIAGNOSIS — R739 Hyperglycemia, unspecified: Secondary | ICD-10-CM | POA: Diagnosis not present

## 2017-07-07 DIAGNOSIS — Z0000 Encounter for general adult medical examination without abnormal findings: Secondary | ICD-10-CM | POA: Diagnosis not present

## 2017-07-07 LAB — CBC
HEMATOCRIT: 46.1 % (ref 39.0–52.0)
HEMOGLOBIN: 15.5 g/dL (ref 13.0–17.0)
MCHC: 33.6 g/dL (ref 30.0–36.0)
MCV: 92.8 fl (ref 78.0–100.0)
Platelets: 223 10*3/uL (ref 150.0–400.0)
RBC: 4.97 Mil/uL (ref 4.22–5.81)
RDW: 13.2 % (ref 11.5–15.5)
WBC: 5.3 10*3/uL (ref 4.0–10.5)

## 2017-07-07 LAB — LIPID PANEL
Cholesterol: 190 mg/dL (ref 0–200)
HDL: 49.4 mg/dL
LDL Cholesterol: 125 mg/dL — ABNORMAL HIGH (ref 0–99)
NonHDL: 140.39
Total CHOL/HDL Ratio: 4
Triglycerides: 79 mg/dL (ref 0.0–149.0)
VLDL: 15.8 mg/dL (ref 0.0–40.0)

## 2017-07-07 LAB — COMPREHENSIVE METABOLIC PANEL
ALBUMIN: 4.2 g/dL (ref 3.5–5.2)
ALK PHOS: 58 U/L (ref 39–117)
ALT: 31 U/L (ref 0–53)
AST: 22 U/L (ref 0–37)
BILIRUBIN TOTAL: 0.7 mg/dL (ref 0.2–1.2)
BUN: 13 mg/dL (ref 6–23)
CO2: 26 mEq/L (ref 19–32)
Calcium: 9.2 mg/dL (ref 8.4–10.5)
Chloride: 106 mEq/L (ref 96–112)
Creatinine, Ser: 0.8 mg/dL (ref 0.40–1.50)
GFR: 104.65 mL/min (ref >60.00)
Glucose, Bld: 114 mg/dL — ABNORMAL HIGH (ref 70–99)
POTASSIUM: 4.6 meq/L (ref 3.5–5.1)
Sodium: 140 mEq/L (ref 135–145)
TOTAL PROTEIN: 6.4 g/dL (ref 6.0–8.3)

## 2017-07-07 LAB — HEMOGLOBIN A1C: Hgb A1c MFr Bld: 5.5 % (ref 4.6–6.5)

## 2017-07-13 ENCOUNTER — Encounter: Payer: Self-pay | Admitting: Family Medicine

## 2017-07-13 ENCOUNTER — Ambulatory Visit (INDEPENDENT_AMBULATORY_CARE_PROVIDER_SITE_OTHER): Payer: No Typology Code available for payment source | Admitting: Family Medicine

## 2017-07-13 VITALS — BP 130/72 | HR 77 | Temp 98.5°F | Ht 68.0 in | Wt 183.6 lb

## 2017-07-13 DIAGNOSIS — Z0000 Encounter for general adult medical examination without abnormal findings: Secondary | ICD-10-CM | POA: Diagnosis not present

## 2017-07-13 DIAGNOSIS — I10 Essential (primary) hypertension: Secondary | ICD-10-CM | POA: Diagnosis not present

## 2017-07-13 DIAGNOSIS — R739 Hyperglycemia, unspecified: Secondary | ICD-10-CM | POA: Diagnosis not present

## 2017-07-13 DIAGNOSIS — E785 Hyperlipidemia, unspecified: Secondary | ICD-10-CM | POA: Diagnosis not present

## 2017-07-13 DIAGNOSIS — Z23 Encounter for immunization: Secondary | ICD-10-CM | POA: Diagnosis not present

## 2017-07-13 MED ORDER — CYCLOBENZAPRINE HCL 10 MG PO TABS: 10.0000 mg | ORAL_TABLET | Freq: Three times a day (TID) | ORAL | 0 refills | Status: DC | PRN

## 2017-07-13 NOTE — Patient Instructions (Addendum)
Would love to see you closer to 165.   Consistent 150 minutes a week exercise advised Try to build diet around veggies and fruits Cut the alcohol down to weekends form 2 a day  Shingrix #1 today. Repeat injection in 2-3 months. Schedule this before you leave.   Send me a Pharmacist, community message after talking with counselor:  Consider trazodone.  Back up option of ativan/lorazepam- would prefer to avoid if possible due to being controlled substance and potentially addictive and easy to build tolerance to

## 2017-07-13 NOTE — Progress Notes (Signed)
Phone: [phone]  Subjective:  Patient presents today for their annual physical. Chief complaint-noted.   See problem oriented charting- ROS- full  review of systems was completed and negative including No chest pain or shortness of breath. No headache or blurry vision.   The following were reviewed and entered/updated in epic: Past Medical History:  Diagnosis Date  . Hyperlipidemia   . 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 Hyperglycemia 05/17/2017    Priority: Medium  . History of skin cancer 06/15/2016    Priority: Medium  . Essential hypertension 06/09/2015    Priority: Medium  . Hyperlipidemia 02/28/2008    Priority: Medium  . Rotator cuff syndrome of left shoulder 03/16/2016  . 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 Anterior tibialis tendinitis of left leg 01/02/2015   Past Surgical History:  Procedure Laterality Date  . A/C repair right  years ago   HS football injury  . KNEE ARTHROSCOPY  11/15/2012   meniscus repair- Dr. [name], right    Family History  Problem Relation Age of Onset  . Diabetes Mother   . Cancer Mother 70       lung cancer-nonsmoker  . Heart disease Father        48s heavy smoker  . Heart disease Brother   . Prostate cancer Father        16s or 48s  . Hyperlipidemia Father     Medications- reviewed and updated Current Outpatient Prescriptions  Medication Sig Dispense Refill  . aspirin 81 MG tablet Take 81 mg by mouth daily.     . rosuvastatin (CRESTOR) 20 MG tablet TAKE 1 TABLET (20 MG TOTAL) BY MOUTH DAILY. 30 tablet 4  . cyclobenzaprine (FLEXERIL) 10 MG tablet Take 1 tablet (10 mg total) by mouth 3 (three) times daily as needed for muscle spasms. 30 tablet 0   No current facility-administered medications for this visit.     Allergies-reviewed and updated No Known Allergies  Social History   Social History  .  Marital status: Married    Spouse name: N/A  . Number of children: N/A  . Years of education: N/A   Occupational History  . [phone]  . 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 Smoking status: Never Smoker  . Smokeless tobacco: Never Used  . Alcohol use Yes     Comment: occasional  . Drug use: No  . Sexual activity: Not Asked   Other Topics Concern  . None   Social History Narrative   Married 1979. 2 kids. No grandkids.       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 Oropharynx normal Neck: no thyromegaly CV: RRR no murmurs rubs or gallops Lungs: CTAB no crackles, wheeze, rhonchi Abdomen: soft/nontender/nondistended/normal bowel sounds. No rebound or guarding.  Ext: no edema Skin: warm, dry- full skin exam by dermatology Neuro: grossly normal, moves all extremities, PERRLA Rectal deferred to urology  EKG: sinus rhythm with rate 65, normal  axis, normal intervals, no hypertrophy, no st or t wave changes. 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 Unchanged compared to 05/29/2014   Assessment/Plan:  61 y.o. male presenting for annual physical.  Health Maintenance counseling: 1. Anticipatory guidance: Patient counseled regarding regular dental exams q6 months, eye exams - yearly, wearing seatbelts.  2. Risk factor reduction:  Advised patient of need for regular exercise and diet rich and fruits and vegetables to reduce risk of heart attack and stroke. Exercise- 3 days a week- 45 minutes- walking. Diet-discussed cutting beer from 2-3 a day to perhaps on weekends  only. Would love to see weight closer to 165  Wt Readings from Last 3 Encounters:  07/13/17 183 lb 9.6 oz (83.3 kg)  06/15/16 186 lb (84.4 kg)  03/16/16 185 lb (83.9 kg)  3. Immunizations/screenings/ancillary studies- discussed shingrix  Immunization History  Administered Date(s) Administered  . Influenza Split 10/25/2011, 11/03/2012  . Influenza Whole 10/08/2008, 09/23/2009, 09/08/2010  . Influenza,inj,Quad PF,36+ Mos 11/09/2013, 10/14/2014, 08/22/2015  . Influenza-Unspecified 09/14/2016  . Td 02/17/2005  . Tdap 06/09/2015  4. Prostate cancer screening-  followed by urology due to trend up over 4 for PSA. No biopsies as of yet. Low risk exam Lab Results  Component Value Date   PSA 4.00 04/26/2017   PSA 4.72 (H) 08/18/2015   PSA 3.95 05/26/2015   5. Colon cancer screening - 03/28/2008 with 10 year repeat. Hyperplastic polyp only 6. Skin cancer screening- sees dermatology at least yearly. Has had a few more canccers  Status of chronic or acute concerns  Hyperlipidemia- LDL at 125 on twice a week but was traveling and missed a few doses. No changes planned  Hyperglycemia- CBG elevated but a1c is not fortunately, still prefer not to increase statin due to potential DM risk and work on lifestyle. Needs to bend weight curve down  On 06/15/26 had referred to [facility] for stress and nutrion under hyperlipidemia  Low back spasms at times- flexeril helped in past- likes to have on hand. Last fill 2014- refilled today.   Anxiety- seeing Jackelyn Hoehn a therapist. A lot of business related anxiety. Sleeps for 3 hours then wakes up with mind racing. Ends up staying up some nights. Wants to consider medication- gave him two options- prefer trazodone- he wants to discuss options on avs with therapist  Hypertension- controlled with weight loss/dash eating plan  Return in about 1 year (around 07/13/2018) for physical.  Orders Placed This Encounter  Procedures  . EKG 12-Lead     Order Specific Question:   Where should this test be performed    Answer:   Other   Meds ordered this encounter  Medications  . cyclobenzaprine (FLEXERIL) 10 MG tablet    Sig: Take 1 tablet (10 mg total) by mouth 3 (three) times daily as needed for muscle spasms.    Dispense:  30 tablet    Refill:  0    Return precautions advised.   Garret Reddish, MD

## 2017-07-15 ENCOUNTER — Encounter: Payer: Self-pay | Admitting: Family Medicine

## 2017-07-15 MED ORDER — TRAZODONE HCL 50 MG PO TABS: 25.0000 mg | ORAL_TABLET | Freq: Every evening | ORAL | 3 refills | Status: DC | PRN

## 2017-09-16 ENCOUNTER — Ambulatory Visit (INDEPENDENT_AMBULATORY_CARE_PROVIDER_SITE_OTHER): Payer: No Typology Code available for payment source

## 2017-09-16 DIAGNOSIS — Z23 Encounter for immunization: Secondary | ICD-10-CM | POA: Diagnosis not present

## 2017-10-25 LAB — PSA: PSA: 5.41

## 2017-11-14 ENCOUNTER — Encounter: Payer: Self-pay | Admitting: Family Medicine

## 2018-02-23 ENCOUNTER — Other Ambulatory Visit: Payer: Self-pay

## 2018-02-23 MED ORDER — TRAZODONE HCL 50 MG PO TABS: 25.0000 mg | ORAL_TABLET | Freq: Every evening | ORAL | 3 refills | Status: DC | PRN

## 2018-03-14 ENCOUNTER — Encounter: Payer: Self-pay | Admitting: Gastroenterology

## 2018-03-16 ENCOUNTER — Encounter: Payer: Self-pay | Admitting: Gastroenterology

## 2018-04-14 ENCOUNTER — Encounter: Payer: Self-pay | Admitting: *Deleted

## 2018-04-18 ENCOUNTER — Other Ambulatory Visit: Payer: Self-pay

## 2018-04-18 MED ORDER — ROSUVASTATIN CALCIUM 20 MG PO TABS: 20.0000 mg | ORAL_TABLET | Freq: Every day | ORAL | 4 refills | Status: DC

## 2018-05-05 ENCOUNTER — Ambulatory Visit (AMBULATORY_SURGERY_CENTER): Payer: Self-pay | Admitting: *Deleted

## 2018-05-05 ENCOUNTER — Telehealth: Payer: Self-pay | Admitting: *Deleted

## 2018-05-05 ENCOUNTER — Other Ambulatory Visit: Payer: Self-pay

## 2018-05-05 VITALS — Ht 68.0 in | Wt 189.0 lb

## 2018-05-05 DIAGNOSIS — Z1211 Encounter for screening for malignant neoplasm of colon: Secondary | ICD-10-CM

## 2018-05-05 MED ORDER — PEG 3350-KCL-NA BICARB-NACL 420 G PO SOLR: 4000.0000 mL | Freq: Once | ORAL | 0 refills | Status: AC

## 2018-05-05 NOTE — Telephone Encounter (Signed)
Colonoscopy later this month is still fine. Thanks

## 2018-05-05 NOTE — Progress Notes (Signed)
No egg or soy allergy  No diet medications taken  Registered in [place]  No anesthesia or intubation problems per pt  No home oxygen used or hx of sleep apnea  Pt was dx with prostate CA in November, 2018; hasn't started treatment yet.  Note sent to Dr. Ardis Hughs so he is aware of this dx

## 2018-05-05 NOTE — Telephone Encounter (Signed)
noted 

## 2018-05-05 NOTE — Telephone Encounter (Signed)
Dr. Ardis Hughs,  This patient was dx with prostate CA in November, 2018.  He states it is "in the very early stages."  He is wanting to get his colonoscopy done before treatment is started.  Dr. Karsten Ro at [facility] is his MD.    Please advise if ok to proceed as scheduled Thanks, Cyril Mourning

## 2018-05-10 ENCOUNTER — Encounter: Payer: Self-pay | Admitting: Gastroenterology

## 2018-05-12 LAB — PSA: PSA: 4.68

## 2018-05-16 ENCOUNTER — Encounter: Payer: Self-pay | Admitting: Gastroenterology

## 2018-05-16 ENCOUNTER — Other Ambulatory Visit: Payer: Self-pay

## 2018-05-16 ENCOUNTER — Ambulatory Visit (AMBULATORY_SURGERY_CENTER): Payer: No Typology Code available for payment source | Admitting: Gastroenterology

## 2018-05-16 VITALS — BP 120/76 | HR 56 | Temp 98.6°F | Resp 19

## 2018-05-16 DIAGNOSIS — Z1211 Encounter for screening for malignant neoplasm of colon: Secondary | ICD-10-CM | POA: Diagnosis not present

## 2018-05-16 DIAGNOSIS — D122 Benign neoplasm of ascending colon: Secondary | ICD-10-CM

## 2018-05-16 MED ORDER — SODIUM CHLORIDE 0.9 % IV SOLN: 500.0000 mL | Freq: Once | INTRAVENOUS | Status: DC

## 2018-05-16 NOTE — Patient Instructions (Signed)
YOU HAD AN ENDOSCOPIC PROCEDURE TODAY AT THE [facility]:   Refer to the procedure report that was given to you for any specific questions about what was found during the examination.  If the procedure report does not answer your questions, please call your gastroenterologist to clarify.  If you requested that your care partner not be given the details of your procedure findings, then the procedure report has been included in a sealed envelope for you to review at your convenience later.  YOU SHOULD EXPECT: Some feelings of bloating in the abdomen. Passage of more gas than usual.  Walking can help get rid of the air that was put into your GI tract during the procedure and reduce the bloating. If you had a lower endoscopy (such as a colonoscopy or flexible sigmoidoscopy) you may notice spotting of blood in your stool or on the toilet paper. If you underwent a bowel prep for your procedure, you may not have a normal bowel movement for a few days.  Please Note:  You might notice some irritation and congestion in your nose or some drainage.  This is from the oxygen used during your procedure.  There is no need for concern and it should clear up in a day or so.  SYMPTOMS TO REPORT IMMEDIATELY:   Following lower endoscopy (colonoscopy or flexible sigmoidoscopy):  Excessive amounts of blood in the stool  Significant tenderness or worsening of abdominal pains  Swelling of the abdomen that is new, acute  Fever of 100F or higher   Please see handout given to you on Polyps.  For urgent or emergent issues, a gastroenterologist can be reached at any hour by calling [phone].   DIET:  We do recommend a small meal at first, but then you may proceed to your regular diet.  Drink plenty of fluids but you should avoid alcoholic beverages for 24 hours.  ACTIVITY:  You should plan to take it easy for the rest of today and you should NOT DRIVE or use heavy machinery until tomorrow (because of  the sedation medicines used during the test).    FOLLOW UP: Our staff will call the number listed on your records the next business day following your procedure to check on you and address any questions or concerns that you may have regarding the information given to you following your procedure. If we do not reach you, we will leave a message.  However, if you are feeling well and you are not experiencing any problems, there is no need to return our call.  We will assume that you have returned to your regular daily activities without incident.  If any biopsies were taken you will be contacted by phone or by letter within the next 1-3 weeks.  Please call us at [phone] if you have not heard about the biopsies in 3 weeks.    SIGNATURES/CONFIDENTIALITY: You and/or your care partner have signed paperwork which will be entered into your electronic medical record.  These signatures attest to the fact that that the information above on your After Visit Summary has been reviewed and is understood.  Full responsibility of the confidentiality of this discharge information lies with you and/or your care-partner.  Thank you for letting us take care of your healthcare needs today. 

## 2018-05-16 NOTE — Progress Notes (Signed)
Called to room to assist during endoscopic procedure.  Patient ID and intended procedure confirmed with present staff. Received instructions for my participation in the procedure from the performing physician.  

## 2018-05-16 NOTE — Op Note (Signed)
[facility] Patient Name: Justyce Baby Procedure Date: 05/16/2018 8:38 AM MRN: [id number] Endoscopist: Milus Banister , MD Age: 62 Referring MD:  Date of Birth: [birth year] Gender: Male Account #: [id number] Procedure:                Colonoscopy Indications:              Screening for colorectal malignant neoplasm Medicines:                Monitored Anesthesia Care Procedure:                Pre-Anesthesia Assessment:                           - Prior to the procedure, a History and Physical                            was performed, and patient medications and                            allergies were reviewed. The patient's tolerance of                            previous anesthesia was also reviewed. The risks                            and benefits of the procedure and the sedation                            options and risks were discussed with the patient.                            All questions were answered, and informed consent                            was obtained. Prior Anticoagulants: The patient has                            taken no previous anticoagulant or antiplatelet                            agents. ASA Grade Assessment: II - A patient with                            mild systemic disease. After reviewing the risks                            and benefits, the patient was deemed in                            satisfactory condition to undergo the procedure.                           After obtaining informed consent, the colonoscope  was passed under direct vision. Throughout the                            procedure, the patient's blood pressure, pulse, and                            oxygen saturations were monitored continuously. The                            Model CF-HQ190L [id number]) scope was introduced                            through the anus and advanced to the the cecum,                            identified by  appendiceal orifice and ileocecal                            valve. The colonoscopy was performed without                            difficulty. The patient tolerated the procedure                            well. The quality of the bowel preparation was                            good. The ileocecal valve, appendiceal orifice, and                            rectum were photographed. Scope In: 8:43:08 AM Scope Out: 8:54:48 AM Scope Withdrawal Time: 0 hours 9 minutes 46 seconds  Total Procedure Duration: 0 hours 11 minutes 40 seconds  Findings:                 A 2 mm polyp was found in the ascending colon. The                            polyp was sessile. The polyp was removed with a                            cold snare. Resection and retrieval were complete.                           The exam was otherwise without abnormality on                            direct and retroflexion views. Complications:            No immediate complications. Estimated blood loss:                            None. Estimated Blood Loss:     Estimated blood loss: none. Impression:               -  One 2 mm polyp in the ascending colon, removed                            with a cold snare. Resected and retrieved.                           - The examination was otherwise normal on direct                            and retroflexion views. Recommendation:           - Patient has a contact number available for                            emergencies. The signs and symptoms of potential                            delayed complications were discussed with the                            patient. Return to normal activities tomorrow.                            Written discharge instructions were provided to the                            patient.                           - Resume previous diet.                           - Continue present medications.                           You will receive a letter within 2-3 weeks  with the                            pathology results and my final recommendations.                           If the polyp(s) is proven to be 'pre-cancerous' on                            pathology, you will need repeat colonoscopy in 5                            years. If the polyp(s) is NOT 'precancerous' on                            pathology then you should repeat colon cancer                            screening in 10 years with colonoscopy without need  for colon cancer screening by any method prior to                            then (including stool testing). Milus Banister, MD 05/16/2018 8:57:17 AM This report has been signed electronically.

## 2018-05-16 NOTE — Progress Notes (Signed)
Spontaneous respirations throughout. VSS. Resting comfortably. To PACU on room air. Report to  RN. 

## 2018-05-16 NOTE — Progress Notes (Signed)
Pt's states no medical or surgical changes since previsit or office visit. 

## 2018-05-17 ENCOUNTER — Telehealth: Payer: Self-pay

## 2018-05-17 NOTE — Telephone Encounter (Signed)
Left message

## 2018-05-17 NOTE — Telephone Encounter (Signed)
  Follow up Call-  Call back number 05/16/2018  Post procedure Call Back phone  # [phone]  Permission to leave phone message Yes  Some recent data might be hidden     Patient questions:  Do you have a fever, pain , or abdominal swelling? No. Pain Score  0 *  Have you tolerated food without any problems? Yes.    Have you been able to return to your normal activities? Yes.    Do you have any questions about your discharge instructions: Diet   No. Medications  No. Follow up visit  No.  Do you have questions or concerns about your Care? No.  Actions: * If pain score is 4 or above: No action needed, pain <4.  No problems noted per pt. maw

## 2018-05-19 ENCOUNTER — Encounter: Payer: Self-pay | Admitting: Gastroenterology

## 2018-05-24 ENCOUNTER — Encounter: Payer: Self-pay | Admitting: Family Medicine

## 2018-05-25 ENCOUNTER — Encounter: Payer: Self-pay | Admitting: Gastroenterology

## 2018-05-25 ENCOUNTER — Encounter: Payer: Self-pay | Admitting: Family Medicine

## 2018-05-25 DIAGNOSIS — Z8546 Personal history of malignant neoplasm of prostate: Secondary | ICD-10-CM | POA: Insufficient documentation

## 2018-05-25 DIAGNOSIS — C61 Malignant neoplasm of prostate: Secondary | ICD-10-CM | POA: Insufficient documentation

## 2018-07-11 ENCOUNTER — Telehealth: Payer: Self-pay | Admitting: Family Medicine

## 2018-07-11 NOTE — Telephone Encounter (Signed)
I called the patient to reschedule his physical for 07/18/18 per Dr. Ansel Bong request. I advised him that the next available was in September. The patient expressed that he didn't like how far away that was, and with his busy schedule, he was unable to come until October. I offered to check and see if I could schedule him sooner. The patient informed me he did not want an appointment that was just squeezed in somewhere but would like the full appointment time. I advised the patient he would have a full appointment. The patient allowed me to schedule him in October for now but he would like a sooner appointment. I advised him I would give him a call tomorrow.

## 2018-07-12 NOTE — Telephone Encounter (Signed)
noted 

## 2018-07-18 ENCOUNTER — Encounter: Payer: No Typology Code available for payment source | Admitting: Family Medicine

## 2018-08-24 ENCOUNTER — Other Ambulatory Visit: Payer: Self-pay | Admitting: Family Medicine

## 2018-09-18 ENCOUNTER — Encounter: Payer: Self-pay | Admitting: Family Medicine

## 2018-09-18 NOTE — Telephone Encounter (Signed)
See note and advise when appointment can be made.

## 2018-09-26 ENCOUNTER — Other Ambulatory Visit: Payer: Self-pay

## 2018-09-26 DIAGNOSIS — R739 Hyperglycemia, unspecified: Secondary | ICD-10-CM

## 2018-09-26 DIAGNOSIS — E785 Hyperlipidemia, unspecified: Secondary | ICD-10-CM

## 2018-10-03 ENCOUNTER — Other Ambulatory Visit (INDEPENDENT_AMBULATORY_CARE_PROVIDER_SITE_OTHER): Payer: No Typology Code available for payment source

## 2018-10-03 DIAGNOSIS — R739 Hyperglycemia, unspecified: Secondary | ICD-10-CM

## 2018-10-03 DIAGNOSIS — E785 Hyperlipidemia, unspecified: Secondary | ICD-10-CM | POA: Diagnosis not present

## 2018-10-03 LAB — COMPREHENSIVE METABOLIC PANEL
ALK PHOS: 57 U/L (ref 39–117)
ALT: 28 U/L (ref 0–53)
AST: 18 U/L (ref 0–37)
Albumin: 4.2 g/dL (ref 3.5–5.2)
BUN: 13 mg/dL (ref 6–23)
CHLORIDE: 104 meq/L (ref 96–112)
CO2: 28 meq/L (ref 19–32)
Calcium: 9.3 mg/dL (ref 8.4–10.5)
Creatinine, Ser: 0.83 mg/dL (ref 0.40–1.50)
GFR: 99.88 mL/min (ref >60.00)
GLUCOSE: 112 mg/dL — AB (ref 70–99)
POTASSIUM: 4.8 meq/L (ref 3.5–5.1)
Sodium: 138 mEq/L (ref 135–145)
TOTAL PROTEIN: 6.6 g/dL (ref 6.0–8.3)
Total Bilirubin: 0.7 mg/dL (ref 0.2–1.2)

## 2018-10-03 LAB — CBC
HEMATOCRIT: 43.6 % (ref 39.0–52.0)
HEMOGLOBIN: 15.2 g/dL (ref 13.0–17.0)
MCHC: 34.9 g/dL (ref 30.0–36.0)
MCV: 90.6 fl (ref 78.0–100.0)
Platelets: 182 10*3/uL (ref 150.0–400.0)
RBC: 4.82 Mil/uL (ref 4.22–5.81)
RDW: 12.8 % (ref 11.5–15.5)
WBC: 4.4 10*3/uL (ref 4.0–10.5)

## 2018-10-03 LAB — LIPID PANEL
Cholesterol: 152 mg/dL (ref 0–200)
HDL: 50 mg/dL (ref >39.00)
LDL CALC: 87 mg/dL (ref 0–99)
NONHDL: 102.31
Total CHOL/HDL Ratio: 3
Triglycerides: 76 mg/dL (ref 0.0–149.0)
VLDL: 15.2 mg/dL (ref 0.0–40.0)

## 2018-10-03 LAB — HEMOGLOBIN A1C: Hgb A1c MFr Bld: 5.5 % (ref 4.6–6.5)

## 2018-10-10 ENCOUNTER — Encounter: Payer: Self-pay | Admitting: Family Medicine

## 2018-10-11 ENCOUNTER — Encounter: Payer: No Typology Code available for payment source | Admitting: Family Medicine

## 2018-10-11 NOTE — Progress Notes (Deleted)
Phone: [phone]  Subjective:  Patient presents today for their annual physical. Chief complaint-noted.   See problem oriented charting- ROS- full  review of systems was completed and negative except for: ***  The following were reviewed and entered/updated in epic: Past Medical History:  Diagnosis Date  . Cancer [facility])    prostate CA- bx done 11-18, hasn't started treatment  . Hyperlipidemia   . 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 Prostate cancer ([place]) 05/25/2018    Priority: High  . Hyperglycemia 05/17/2017    Priority: Medium  . History of skin cancer 06/15/2016    Priority: Medium  . Essential hypertension 06/09/2015    Priority: Medium  . Hyperlipidemia 02/28/2008    Priority: Medium  . Rotator cuff syndrome of left shoulder 03/16/2016  . 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 Anterior tibialis tendinitis of left leg 01/02/2015   Past Surgical History:  Procedure Laterality Date  . A/C repair right  years ago   [employer] football injury  . COLONOSCOPY    . KNEE ARTHROSCOPY  11/15/2012   meniscus repair- Dr. [name], right  . PROSTATE BIOPSY     November 2018    Family History  Problem Relation Age of Onset  . Heart disease Father        39s heavy smoker  . Prostate cancer Father        14s or 66s  . Hyperlipidemia Father   . Diabetes Mother   . Cancer Mother 76       lung cancer-nonsmoker  . Heart disease Brother   . Colon cancer Neg Hx   . Esophageal cancer Neg Hx   . Rectal cancer Neg Hx   . Stomach cancer Neg Hx     Medications- reviewed and updated Current Outpatient Medications  Medication Sig Dispense Refill  . aspirin 81 MG tablet Take 81 mg by mouth daily.     . cyclobenzaprine (FLEXERIL) 10 MG tablet Take 1 tablet (10 mg total) by mouth 3 (three) times daily as needed for muscle spasms. (Patient not taking: Reported on 05/05/2018)  30 tablet 0  . rosuvastatin (CRESTOR) 20 MG tablet Take 1 tablet (20 mg total) by mouth daily. 30 tablet 4  . rosuvastatin (CRESTOR) 20 MG tablet Take 20 mg by mouth daily.    . traZODone (DESYREL) 50 MG tablet TAKE 0.5-1 TABLETS (25-50 MG TOTAL) BY MOUTH AT BEDTIME AS NEEDED FOR SLEEP. 30 tablet 3   Current Facility-Administered Medications  Medication Dose Route Frequency Provider Last Rate Last Dose  . 0.9 %  sodium chloride infusion  500 mL Intravenous Once Milus Banister, MD        Allergies-reviewed and updated No Known Allergies  Social History   Social History Narrative   Married 1979. 2 kids. No grandkids.       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 Gen: NAD, resting comfortably HEENT: Mucous membranes are moist. Oropharynx normal Neck: no thyromegaly CV: RRR no murmurs rubs or gallops Lungs: CTAB no crackles, wheeze, rhonchi Abdomen: soft/nontender/nondistended/normal bowel sounds. No rebound or guarding.  Ext: no edema Skin: warm, dry Neuro: grossly normal, moves all extremities, PERRLA ***  Assessment/Plan:  62 y.o. male presenting for annual physical.  Health Maintenance counseling: 1. Anticipatory guidance: Patient counseled regarding regular dental exams ***q6 months, eye exams ***yearly, wearing seatbelts.  2. Risk factor reduction:  Advised patient of need for regular exercise and diet rich and fruits and vegetables to reduce risk of heart attack and stroke. Exercise-S2 exercising 3 days a week***. Diet-we discussed last year working toward weight goal of 165 or so***.  Wt Readings from Last 3 Encounters:  05/05/18 189 lb (85.7 kg)  07/13/17 183 lb 9.6 oz (83.3 kg)  06/15/16 186 lb (84.4 kg)  3. Immunizations/screenings/ancillary studies-advised flu shot today***  Immunization History    Administered Date(s) Administered  . Influenza Split 10/25/2011, 11/03/2012  . Influenza Whole 10/08/2008, 09/23/2009, 09/08/2010  . Influenza,inj,Quad PF,6+ Mos 11/09/2013, 10/14/2014, 08/22/2015, 09/16/2017  . Influenza-Unspecified 09/14/2016  . Td 02/17/2005  . Tdap 06/09/2015  . Zoster Recombinat (Shingrix) 07/13/2017, 09/16/2017  4. Prostate cancer screening- ***followed by urology due to elevated PSA.  No biopsies as of yet.  They will complete his PSAs and rectal exams. Lab Results  Component Value Date   PSA 4.68 05/12/2018   PSA 5.41 10/25/2017   PSA 4.00 04/26/2017   5. Colon cancer screening -05/16/2018 with 5-year follow-up due to tubular adenoma 6. Skin cancer screening-sees dermatology yearly.***advised regular sunscreen use. Denies worrisome, changing, or new skin lesions.    Status of chronic or acute concerns  *** Hyperlipidemia- LDL reasonable at 87 on twice a week rosuvastatin***  Hyperglycemia-fortunately A1c is not elevated at 5.5.  Has had high CBGs in the past  Low back spasms at times-Flexeril helps-refilled last year.  Still has some available***  Anxiety-seeing Arlyn Leak therapist.  Business related anxiety.  We opted to trial trazodone last year for sleep related issues  Hypertension- patient submits 19 Home readings with average 136/86.  Reasonably controlled without medication  No problem-specific Assessment & Plan notes found for this encounter.   Future Appointments  Date Time Provider [place]  10/11/2018  9:45 AM Marin Olp, MD [facility]   No follow-ups on file.  Lab/Order associations: No diagnosis found.  No orders of the defined types were placed in this encounter.   Return precautions advised.  Garret Reddish, MD

## 2018-10-27 ENCOUNTER — Encounter: Payer: Self-pay | Admitting: Family Medicine

## 2018-10-27 ENCOUNTER — Ambulatory Visit (INDEPENDENT_AMBULATORY_CARE_PROVIDER_SITE_OTHER): Payer: No Typology Code available for payment source | Admitting: Family Medicine

## 2018-10-27 VITALS — BP 132/80 | HR 68 | Temp 98.4°F | Ht 68.0 in | Wt 182.8 lb

## 2018-10-27 DIAGNOSIS — Z8249 Family history of ischemic heart disease and other diseases of the circulatory system: Secondary | ICD-10-CM

## 2018-10-27 DIAGNOSIS — I10 Essential (primary) hypertension: Secondary | ICD-10-CM | POA: Diagnosis not present

## 2018-10-27 DIAGNOSIS — E785 Hyperlipidemia, unspecified: Secondary | ICD-10-CM | POA: Diagnosis not present

## 2018-10-27 DIAGNOSIS — Z0000 Encounter for general adult medical examination without abnormal findings: Secondary | ICD-10-CM

## 2018-10-27 DIAGNOSIS — Z23 Encounter for immunization: Secondary | ICD-10-CM

## 2018-10-27 DIAGNOSIS — F419 Anxiety disorder, unspecified: Secondary | ICD-10-CM

## 2018-10-27 DIAGNOSIS — R739 Hyperglycemia, unspecified: Secondary | ICD-10-CM

## 2018-10-27 DIAGNOSIS — Z6826 Body mass index (BMI) 26.0-26.9, adult: Secondary | ICD-10-CM

## 2018-10-27 DIAGNOSIS — Z86010 Personal history of colonic polyps: Secondary | ICD-10-CM

## 2018-10-27 DIAGNOSIS — C61 Malignant neoplasm of prostate: Secondary | ICD-10-CM

## 2018-10-27 NOTE — Assessment & Plan Note (Signed)
Anxiety- seeing Jackelyn Hoehn a therapist. A lot of business related anxiety. Trazodone has been helpful for sleep. 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

## 2018-10-27 NOTE — Progress Notes (Addendum)
Phone: [phone]  Subjective:  Patient presents today for their annual physical. Chief complaint-noted.   See problem oriented charting- ROS- full  review of systems was completed and negative except for: some insomnia but does well on trazadone  The following were reviewed and entered/updated in epic: Past Medical History:  Diagnosis Date  . Cancer [facility])    prostate CA- bx done 11-18, hasn't started treatment  . Hyperlipidemia   . 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 Prostate cancer ([place]) 05/25/2018    Priority: High  . Hyperglycemia 05/17/2017    Priority: Medium  . History of skin cancer 06/15/2016    Priority: Medium  . Essential hypertension 06/09/2015    Priority: Medium  . Hyperlipidemia 02/28/2008    Priority: Medium  . History of adenomatous polyp of colon 10/27/2018    Priority: Low  . Anxiety 10/27/2018  . Rotator cuff syndrome of left shoulder 03/16/2016  . 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 Anterior tibialis tendinitis of left leg 01/02/2015   Past Surgical History:  Procedure Laterality Date  . A/C repair right  years ago   HS football injury  . COLONOSCOPY    . KNEE ARTHROSCOPY  11/15/2012   meniscus repair- Dr. [name], right  . PROSTATE BIOPSY     November 2018    Family History  Problem Relation Age of Onset  . Heart disease Father        40s heavy smoker  . Prostate cancer Father        58s or 8s  . Hyperlipidemia Father   . Diabetes Mother   . Cancer Mother 74       lung cancer-nonsmoker  . Heart disease Brother   . Colon cancer Neg Hx   . Esophageal cancer Neg Hx   . Rectal cancer Neg Hx   . Stomach cancer Neg Hx     Medications- reviewed and updated Current Outpatient Medications  Medication Sig Dispense Refill  . aspirin 81 MG tablet Take 81 mg by mouth daily.     . cyclobenzaprine (FLEXERIL) 10 MG tablet  Take 1 tablet (10 mg total) by mouth 3 (three) times daily as needed for muscle spasms. 30 tablet 0  . rosuvastatin (CRESTOR) 20 MG tablet Take 1 tablet (20 mg total) by mouth daily. 30 tablet 4  . traZODone (DESYREL) 50 MG tablet TAKE 0.5-1 TABLETS (25-50 MG TOTAL) BY MOUTH AT BEDTIME AS NEEDED FOR SLEEP. 30 tablet 3   No current facility-administered medications for this visit.     Allergies-reviewed and updated No Known Allergies  Social History   Social History Narrative   Married 1979. 2 kids. No grandkids.       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 Oropharynx normal Neck: no thyromegaly CV: RRR no murmurs rubs or gallops Lungs: CTAB no crackles, wheeze, rhonchi Abdomen: soft/nontender/nondistended/normal bowel sounds. No rebound or guarding.  Ext: no edema Skin: warm,  dry Neuro: grossly normal, moves all extremities, PERRLA  Assessment/Plan:  62 y.o. male presenting for annual physical.  Health Maintenance counseling: 1. Anticipatory guidance: Patient counseled regarding regular dental exams -q6 months with Dr. Johnn Hai, eye exams -yearly with Dr. Len Childs,  avoiding smoking and second hand smoke, limiting alcohol to 2 beverages per day - he is about 2-3 or 14 total per week.   2. Risk factor reduction:  Advised patient of need for regular exercise and diet rich and fruits and vegetables to reduce risk of heart attack and stroke. Exercise-3 days a week for 45 minutes walking last year- now up to 6 days a week walking to try to help blood pressure. Diet-weight down 1 pound from last year  with long-term weight goal 165- but down 7 lbs over last 6 months.  Wt Readings from Last 3 Encounters:  10/27/18 182 lb 12.8 oz (82.9 kg)  05/05/18 189 lb (85.7 kg)  07/13/17 183 lb 9.6 oz (83.3 kg)  3. Immunizations/screenings/ancillary studies- flu shot given today.  Otherwise up-to-date Immunization History  Administered Date(s) Administered  . Influenza Split 10/25/2011, 11/03/2012  . Influenza Whole 10/08/2008, 09/23/2009, 09/08/2010  . Influenza,inj,Quad PF,6+ Mos 11/09/2013, 10/14/2014, 08/22/2015, 09/16/2017, 10/27/2018  . Influenza-Unspecified 09/14/2016  . Td 02/17/2005  . Tdap 06/09/2015  . Zoster Recombinat (Shingrix) 07/13/2017, 09/16/2017  4. Prostate cancer history- [facility]-Dr. Rudie Meyer active surveillance. q3 month PSA and q6 month office visits.  Lab Results  Component Value Date   PSA 4.68 05/12/2018   PSA 5.41 10/25/2017   PSA 4.00 04/26/2017   5. Colon cancer screening - 05/16/2018 with 5-year repeat due to adenomatous polyp 6. Skin cancer screening-sees dermatology yearly. advised regular sunscreen use. Denies worrisome, changing, or new skin lesions.  7.  Never smoker  Status of chronic or acute concerns   Hyperglycemia- a1c was not elevated this year. Can continue to monitor- exercise likely helping here. He has had elevated CBGs so asks for this yearly.   Low back spasms at times- flexeril helped in past- likes to have on hand. Refilled last year.   Hyperlipidemia Hyperlipidemia- about 4 months ago tried the rosuvastatin daily- patient went back to twice a week (that is lipid panel below). Will leave as daily prescription in case he wants to increase dose back up to be more aggressive with lipid goals Lab Results  Component Value Date   CHOL 152 10/03/2018   HDL 50.00 10/03/2018   [place] 87 10/03/2018   LDLDIRECT 150.3 11/02/2013   TRIG 76.0 10/03/2018   CHOLHDL 3 10/03/2018    With his family history of early CAD in  father in early 104s he has had stress tests in the past.  He asks about updating this.  We discussed alternate option of coronary CT-he would like to proceed forward with this.  We agreed if he has elevated risk we would like to intensify statin therapy and potentially place him on blood pressure medication  He still may want to get a cardiology consult after this and that would be fine  Essential hypertension Hypertension- controlled with weight loss/dash eating plan/exercise today. Some elevations on home #s but overall with home # today and many home #s in normal range will continue without rx.   Once again if coronary calcium CT scoring is elevated may consider blood pressure medication.  Anxiety Anxiety- seeing Jackelyn Hoehn a therapist. A lot of business related anxiety. Trazodone has been helpful for sleep. 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    Prostate cancer [facility]) S:Under active surveillance with Dr. Karsten Ro A/P: stable- continue to follow up with urology   Future Appointments  Date Time Provider [place]  11/13/2018  9:30 AM [facility]  10/29/2019  8:15 AM [facility]  11/01/2019  9:20 AM Marin Olp, MD [facility]   1 year physical  Lab/Order associations: Not fasting Preventative health care  Need for prophylactic vaccination and inoculation against influenza - Plan: Flu Vaccine QUAD 36+ mos IM  History of adenomatous polyp of colon  Family history of premature CAD - Plan: CT CARDIAC SCORING  Hyperlipidemia, unspecified hyperlipidemia type - Plan: CT CARDIAC SCORING  Essential hypertension  Anxiety  Prostate cancer ([place])  Return precautions advised.  Garret Reddish, MD

## 2018-10-27 NOTE — Patient Instructions (Addendum)
Please stop by lab before you go  Continue almost daily exercise and try to focus on DASH type eating plan  We will call you within two weeks about your referral for coronary CT. If you do not hear within 3 weeks, give [place] a call.       DASH Eating Plan DASH stands for "Dietary Approaches to Stop Hypertension." The DASH eating plan is a healthy eating plan that has been shown to reduce high blood pressure (hypertension). It may also reduce your risk for type 2 diabetes, heart disease, and stroke. The DASH eating plan may also help with weight loss. What are tips for following this plan? General guidelines  Avoid eating more than 2,300 mg (milligrams) of salt (sodium) a day. If you have hypertension, you may need to reduce your sodium intake to 1,500 mg a day.  Limit alcohol intake to no more than 1 drink a day for nonpregnant women and 2 drinks a day for men. One drink equals 12 oz of beer, 5 oz of wine, or 1 oz of hard liquor.  Work with your health care provider to maintain a healthy body weight or to lose weight. Ask what an ideal weight is for you.  Get at least 30 minutes of exercise that causes your heart to beat faster (aerobic exercise) most days of the week. Activities may include walking, swimming, or biking.  Work with your health care provider or diet and nutrition specialist (dietitian) to adjust your eating plan to your individual calorie needs. Reading food labels  Check food labels for the amount of sodium per serving. Choose foods with less than 5 percent of the Daily Value of sodium. Generally, foods with less than 300 mg of sodium per serving fit into this eating plan.  To find whole grains, look for the word "whole" as the first word in the ingredient list. Shopping  Buy products labeled as "low-sodium" or "no salt added."  Buy fresh foods. Avoid canned foods and premade or frozen meals. Cooking  Avoid adding salt when cooking. Use salt-free seasonings or herbs  instead of table salt or sea salt. Check with your health care provider or pharmacist before using salt substitutes.  Do not fry foods. Cook foods using healthy methods such as baking, boiling, grilling, and broiling instead.  Cook with heart-healthy oils, such as olive, canola, soybean, or sunflower oil. Meal planning   Eat a balanced diet that includes: ? 5 or more servings of fruits and vegetables each day. At each meal, try to fill half of your plate with fruits and vegetables. ? Up to 6-8 servings of whole grains each day. ? Less than 6 oz of lean meat, poultry, or fish each day. A 3-oz serving of meat is about the same size as a deck of cards. One egg equals 1 oz. ? 2 servings of low-fat dairy each day. ? A serving of nuts, seeds, or beans 5 times each week. ? Heart-healthy fats. Healthy fats called Omega-3 fatty acids are found in foods such as flaxseeds and coldwater fish, like sardines, salmon, and mackerel.  Limit how much you eat of the following: ? Canned or prepackaged foods. ? Food that is high in trans fat, such as fried foods. ? Food that is high in saturated fat, such as fatty meat. ? Sweets, desserts, sugary drinks, and other foods with added sugar. ? Full-fat dairy products.  Do not salt foods before eating.  Try to eat at least 2 vegetarian meals  each week.  Eat more home-cooked food and less restaurant, buffet, and fast food.  When eating at a restaurant, ask that your food be prepared with less salt or no salt, if possible. What foods are recommended? The items listed may not be a complete list. Talk with your dietitian about what dietary choices are best for you. Grains Whole-grain or whole-wheat bread. Whole-grain or whole-wheat pasta. Brown rice. [place]. Bulgur. Whole-grain and low-sodium cereals. Pita bread. Low-fat, low-sodium crackers. Whole-wheat flour tortillas. Vegetables Fresh or frozen vegetables (raw, steamed, roasted, or grilled).  Low-sodium or reduced-sodium tomato and vegetable juice. Low-sodium or reduced-sodium tomato sauce and tomato paste. Low-sodium or reduced-sodium canned vegetables. Fruits All fresh, dried, or frozen fruit. Canned fruit in natural juice (without added sugar). Meat and other protein foods Skinless chicken or [place]. Ground chicken or [place]. Pork with fat trimmed off. Fish and seafood. Egg whites. Dried beans, peas, or lentils. Unsalted nuts, nut butters, and seeds. Unsalted canned beans. Lean cuts of beef with fat trimmed off. Low-sodium, lean deli meat. Dairy Low-fat (1%) or fat-free (skim) milk. Fat-free, low-fat, or reduced-fat cheeses. Nonfat, low-sodium ricotta or cottage cheese. Low-fat or nonfat yogurt. Low-fat, low-sodium cheese. Fats and oils Soft margarine without trans fats. Vegetable oil. Low-fat, reduced-fat, or light mayonnaise and salad dressings (reduced-sodium). Canola, safflower, olive, soybean, and sunflower oils. Avocado. Seasoning and other foods Herbs. Spices. Seasoning mixes without salt. Unsalted popcorn and pretzels. Fat-free sweets. What foods are not recommended? The items listed may not be a complete list. Talk with your dietitian about what dietary choices are best for you. Grains Baked goods made with fat, such as croissants, muffins, or some breads. Dry pasta or rice meal packs. Vegetables Creamed or fried vegetables. Vegetables in a cheese sauce. Regular canned vegetables (not low-sodium or reduced-sodium). Regular canned tomato sauce and paste (not low-sodium or reduced-sodium). Regular tomato and vegetable juice (not low-sodium or reduced-sodium). Angie Fava. Olives. Fruits Canned fruit in a light or heavy syrup. Fried fruit. Fruit in cream or butter sauce. Meat and other protein foods Fatty cuts of meat. Ribs. Fried meat. Berniece Salines. Sausage. Bologna and other processed lunch meats. Salami. Fatback. Hotdogs. Bratwurst. Salted nuts and seeds. Canned beans with added  salt. Canned or smoked fish. Whole eggs or egg yolks. Chicken or [place] with skin. Dairy Whole or 2% milk, cream, and half-and-half. Whole or full-fat cream cheese. Whole-fat or sweetened yogurt. Full-fat cheese. Nondairy creamers. Whipped toppings. Processed cheese and cheese spreads. Fats and oils Butter. Stick margarine. Lard. Shortening. Ghee. Bacon fat. Tropical oils, such as coconut, palm kernel, or palm oil. Seasoning and other foods Salted popcorn and pretzels. Onion salt, garlic salt, seasoned salt, table salt, and sea salt. Worcestershire sauce. Tartar sauce. Barbecue sauce. Teriyaki sauce. Soy sauce, including reduced-sodium. Steak sauce. Canned and packaged gravies. Fish sauce. Oyster sauce. Cocktail sauce. Horseradish that you find on the shelf. Ketchup. Mustard. Meat flavorings and tenderizers. Bouillon cubes. Hot sauce and Tabasco sauce. Premade or packaged marinades. Premade or packaged taco seasonings. Relishes. Regular salad dressings. Where to find more information:  National Heart, Lung, and [place]: https://wilson-eaton.com/  American Heart Association: www.heart.org Summary  The DASH eating plan is a healthy eating plan that has been shown to reduce high blood pressure (hypertension). It may also reduce your risk for type 2 diabetes, heart disease, and stroke.  With the DASH eating plan, you should limit salt (sodium) intake to 2,300 mg a day. If you have hypertension, you may need to reduce  your sodium intake to 1,500 mg a day.  When on the DASH eating plan, aim to eat more fresh fruits and vegetables, whole grains, lean proteins, low-fat dairy, and heart-healthy fats.  Work with your health care provider or diet and nutrition specialist (dietitian) to adjust your eating plan to your individual calorie needs. This information is not intended to replace advice given to you by your health care provider. Make sure you discuss any questions you have with your health care  provider. Document Released: 11/25/2011 Document Revised: 11/29/2016 Document Reviewed: 11/29/2016 Elsevier Interactive Patient Education  [employer].

## 2018-10-27 NOTE — Assessment & Plan Note (Signed)
Hypertension- controlled with weight loss/dash eating plan/exercise today. Some elevations on home #s but overall with home # today and many home #s in normal range will continue without rx.   Once again if coronary calcium CT scoring is elevated may consider blood pressure medication.

## 2018-10-27 NOTE — Assessment & Plan Note (Signed)
Hyperlipidemia- about 4 months ago tried the rosuvastatin daily- patient went back to twice a week (that is lipid panel below). Will leave as daily prescription in case he wants to increase dose back up to be more aggressive with lipid goals Lab Results  Component Value Date   CHOL 152 10/03/2018   HDL 50.00 10/03/2018   [place] 87 10/03/2018   LDLDIRECT 150.3 11/02/2013   TRIG 76.0 10/03/2018   CHOLHDL 3 10/03/2018    With his family history of early CAD in father in early 35s he has had stress tests in the past.  He asks about updating this.  We discussed alternate option of coronary CT-he would like to proceed forward with this.  We agreed if he has elevated risk we would like to intensify statin therapy and potentially place him on blood pressure medication  He still may want to get a cardiology consult after this and that would be fine

## 2018-11-03 NOTE — Assessment & Plan Note (Signed)
S:Under active surveillance with Dr. Karsten Ro A/P: stable- continue to follow up with urology

## 2018-11-03 NOTE — Addendum Note (Signed)
Addended by: Marin Olp on: 11/03/2018 02:52 PM   Modules accepted: Miquel Dunn

## 2018-11-13 ENCOUNTER — Encounter: Payer: Self-pay | Admitting: Family Medicine

## 2018-11-13 ENCOUNTER — Ambulatory Visit (INDEPENDENT_AMBULATORY_CARE_PROVIDER_SITE_OTHER)
Admission: RE | Admit: 2018-11-13 | Discharge: 2018-11-13 | Disposition: A | Discharge status: Home / Self Care | Payer: Self-pay | Source: Ambulatory Visit | Attending: Family Medicine | Admitting: Family Medicine

## 2018-11-13 DIAGNOSIS — E785 Hyperlipidemia, unspecified: Secondary | ICD-10-CM

## 2018-11-13 DIAGNOSIS — Z8249 Family history of ischemic heart disease and other diseases of the circulatory system: Secondary | ICD-10-CM

## 2018-11-13 LAB — PSA: PSA: 5.33

## 2018-11-14 ENCOUNTER — Other Ambulatory Visit: Payer: Self-pay

## 2018-11-14 DIAGNOSIS — I712 Thoracic aortic aneurysm, without rupture, unspecified: Secondary | ICD-10-CM

## 2018-11-14 DIAGNOSIS — E785 Hyperlipidemia, unspecified: Secondary | ICD-10-CM

## 2018-11-30 ENCOUNTER — Encounter: Payer: Self-pay | Admitting: Family Medicine

## 2018-12-19 ENCOUNTER — Encounter: Payer: Self-pay | Admitting: Family Medicine

## 2019-01-03 ENCOUNTER — Ambulatory Visit (INDEPENDENT_AMBULATORY_CARE_PROVIDER_SITE_OTHER): Payer: Managed Care, Other (non HMO) | Admitting: Cardiology

## 2019-01-03 ENCOUNTER — Encounter: Payer: Self-pay | Admitting: Cardiology

## 2019-01-03 VITALS — BP 148/82 | HR 61 | Ht 68.0 in | Wt 185.2 lb

## 2019-01-03 DIAGNOSIS — R739 Hyperglycemia, unspecified: Secondary | ICD-10-CM | POA: Diagnosis not present

## 2019-01-03 DIAGNOSIS — I2510 Atherosclerotic heart disease of native coronary artery without angina pectoris: Secondary | ICD-10-CM | POA: Diagnosis not present

## 2019-01-03 DIAGNOSIS — E785 Hyperlipidemia, unspecified: Secondary | ICD-10-CM

## 2019-01-03 DIAGNOSIS — I10 Essential (primary) hypertension: Secondary | ICD-10-CM | POA: Diagnosis not present

## 2019-01-03 MED ORDER — LOSARTAN POTASSIUM 25 MG PO TABS: 25.0000 mg | ORAL_TABLET | Freq: Every day | ORAL | 3 refills | Status: DC

## 2019-01-03 NOTE — Patient Instructions (Signed)
Medication Instructions:   Start taking  ROSUVASTATIN 20 mg everyday  Start taking LOSARTAN 25 MG ( 1/2 TABLET) for the first 2 weeks then take one whole tablet daily.  If you need a refill on your cardiac medications before your next appointment, please call your pharmacy.   Lab work:  Not needed  If you have labs (blood work) drawn today and your tests are completely normal, you will receive your results only by: Marland Kitchen MyChart Message (if you have MyChart) OR . A paper copy in the mail If you have any lab test that is abnormal or we need to change your treatment, we will call you to review the results.  Testing/Procedures: Not needed  Follow-Up: At [facility], you and your health needs are our priority.  As part of our continuing mission to provide you with exceptional heart care, we have created designated Provider Care Teams.  These Care Teams include your primary Cardiologist (physician) and Advanced Practice Providers (APPs -  Physician Assistants and Nurse Practitioners) who all work together to provide you with the care you need, when you need it. You will need a follow up appointment in 12 months jan 2021.  Please call our office 2 months in advance to schedule this appointment.  You may see Dr Ellyn Hack or one of the following Advanced Practice Providers on your designated Care Team:   Rosaria Ferries, PA-C . Jory Sims, DNP, ANP  Any Other Special Instructions Will Be Listed Below (If Applicable).

## 2019-01-03 NOTE — Progress Notes (Signed)
PCP: Marin Olp, MD  Clinic Note: Chief Complaint  Patient presents with  . New Admit To SNF    Coronary calcium score results  . Hypertension    Borderline, blood pressures are going higher    HPI: Patrick [name] is a 63 y.o. male with a PMH below who presents today for Coronary Artery Calcium & Thoracic Aortic dilation.  He is being seen today at the request of Marin Olp, MD.  Patrick [name] was last seen on October 27, 2018 by Dr. Yong Channel, and he decided his cardiac based on risk assessment we will order a Coronary Calcium Score.  Recent Hospitalizations: None  Studies Personally Reviewed - (if available, images/films reviewed: From Epic Chart or Care Everywhere)  Coronary Calcium Score (November 13, 2018)- 158.  Mild Calcium in all 3 Epicardial coronary arteries.  Slight TA dilation ~4 cm.   Interval History: Patrick [name] presents today basically feeling that he should probably establish with a cardiologist based on coronary calcium score results and the fact that his blood pressure has been starting to trend up lately.  He is concerned about making sure things do not progress.  He wanted interpretation of the CT score. He himself is relatively active.  He acknowledges he is probably not as active as he should be, but is now starting a program where he is walking at least 30 minutes a day 6 days a week per discussion with Dr. Yong Channel. With the level of activity he does, he denies any chest pain or shortness of breath with rest or exertion.  No PND, orthopnea or edema.  No palpitations, lightheadedness, dizziness, weakness or syncope/near syncope. No TIA/amaurosis fugax symptoms. No claudication. He denies any headache or blurred vision, dizziness associated to blood pressure.  Has not had significantly elevated blood pressure readings.  ROS: A comprehensive was performed. Review of Systems  Constitutional: Negative for chills, fever, malaise/fatigue and weight  loss.  HENT: Negative for congestion and nosebleeds.   Respiratory: Negative for cough, shortness of breath and wheezing.   Gastrointestinal: Negative for abdominal pain and heartburn.  Musculoskeletal: Negative for joint pain.  Neurological: Negative for dizziness and focal weakness.  Psychiatric/Behavioral: Negative for depression and memory loss. The patient is not nervous/anxious and does not have insomnia.   All other systems reviewed and are negative.    I have reviewed and (if needed) personally updated the patient's problem list, medications, allergies, past medical and surgical history, social and family history.   Past Medical History:  Diagnosis Date  . Cancer [facility])    prostate CA- bx done 11-18, hasn't started treatment  . Coronary artery calcification seen on CAT scan    Coronary Calcium Score (November 13, 2018)- 158.  Mild Calcium in all 3 Epicardial coronary arteries.  Slight TA dilation ~4 cm.   . Hyperlipidemia    Rosuvastatin just ordered, but not yet started.  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 A/C repair right  years ago   [employer] football injury  . COLONOSCOPY    . KNEE ARTHROSCOPY  11/15/2012   meniscus repair- Dr. [name], right  . PROSTATE BIOPSY     November 2018    Current Meds  Medication Sig  . aspirin 81 MG tablet Take 81 mg by mouth daily.   . cyclobenzaprine (FLEXERIL) 10 MG tablet Take 1 tablet (10  mg total) by mouth 3 (three) times daily as needed for muscle spasms.  . rosuvastatin (CRESTOR) 20 MG tablet Take 1 tablet (20 mg total) by mouth daily.  . traZODone (DESYREL) 50 MG tablet TAKE 0.5-1 TABLETS (25-50 MG TOTAL) BY MOUTH AT BEDTIME AS NEEDED FOR SLEEP.    No Known Allergies  Social History   Tobacco Use  . Smoking status: Never Smoker  . Smokeless tobacco: Never Used  Substance Use Topics  . Alcohol use: Yes    Comment:  occasional  . Drug use: No   Social History   Social History Narrative   Married 1979. 2 kids. No grandkids.       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  Wt Readings from Last 3 Encounters:  01/03/19 185 lb 3.2 oz (84 kg)  10/27/18 182 lb 12.8 oz (82.9 kg)  05/05/18 189 lb (85.7 kg)    PHYSICAL EXAM BP (!) 148/82 (BP Location: Left Arm, Patient Position: Sitting, Cuff Size: Normal)   Pulse 61   Wt 185 lb 3.2 oz (84 kg)   BMI 28.16 kg/m  Physical Exam  Constitutional: He is oriented to person, place, and time. He appears well-developed and well-nourished. No distress.  Healthy-appearing.  Well-groomed.  HENT:  Head: Normocephalic and atraumatic.  Eyes: Pupils are equal, round, and reactive to light. Conjunctivae and EOM are normal. No scleral icterus.  Neck: Normal range of motion. Neck supple.  Cardiovascular: Normal rate, regular rhythm, normal heart sounds and intact distal pulses. Exam reveals no gallop and no friction rub.  No murmur heard. Pulmonary/Chest: Effort normal and breath sounds normal. No respiratory distress. He has no wheezes. He has no rales.  Abdominal: Soft. Bowel sounds are normal. He exhibits no distension. There is no abdominal tenderness. There is no rebound.  No HSM  Musculoskeletal: Normal range of motion.        General: No edema.  Neurological: He is alert and oriented to person, place, and time. No cranial nerve deficit.  Skin: Skin is warm and dry. No erythema.  Psychiatric: He has a normal mood and affect. His behavior is normal. Judgment and thought content normal.  Vitals reviewed.    Adult ECG Report  Rate: 61 ;  Rhythm: normal sinus  rhythm and Borderline poor R wave progression.  Subtle inferior Q waves but not enough to suggest prior infarct.  Otherwise normal axis, intervals and durations.;   Narrative Interpretation: Relatively normal EKG.   Other studies Reviewed: Additional studies/ records that were reviewed today include:  Recent Labs:   Lab Results  Component Value Date   CHOL 152 10/03/2018   HDL 50.00 10/03/2018   LDLCALC 87 10/03/2018   LDLDIRECT 150.3 11/02/2013   TRIG 76.0 10/03/2018   CHOLHDL 3 10/03/2018   Lab Results  Component Value Date   CREATININE 0.83 10/03/2018   BUN 13 10/03/2018   NA 138 10/03/2018   K 4.8 10/03/2018   CL 104 10/03/2018   CO2 28 10/03/2018   Lab Results  Component Value Date   HGBA1C 5.5 10/03/2018     ASSESSMENT / PLAN: Problem List Items Addressed This Visit    Coronary artery calcification seen on CAT scan - Primary (  Chronic)    Intermediate risk coronary calcium score of 156.  This is more than 1 would expect with just minimal risk.  As such, I do agree with starting him on a statin. We will add ARB (not beta-blocker because a rate of 61 bpm. Also discussed daily aspirin.  The plan will be to reassess coronary calcium score in a couple years to see if there is been any progression of disease.  This will also help [place] see the borderline dilation of the thoracic aorta.      Relevant Medications   losartan (COZAAR) 25 MG tablet   Essential hypertension (Chronic)    Blood pressure is high today, and he is concerned that his pressures have been going up of late. Plan: We will start low-dose losartan and titrate from 12.5 mg to 25 mg.      Relevant Medications   losartan (COZAAR) 25 MG tablet   Other Relevant Orders   EKG 12-Lead (Completed)   Hyperglycemia    A1c was 5.5.  Most recent glucose level was not high.  Monitor as this would increase his risk and placing and borderline in the metabolic syndrome category.      Hyperlipidemia (Chronic)    He  does have a significant family history of CAD and now has evidence of coronary artery calcification.  HDL is at goal, LDL is 87.  This is with diet and exercise, but I do agree that is probably reasonable to start rosuvastatin now that was prescribed by his PCP.  Hopefully the plan will be to drive his LDL below 70 and that can prevent progression of disease.      Relevant Medications   losartan (COZAAR) 25 MG tablet      I spent a total of 25 minutes with the patient and chart review. >  50% of the time was spent in direct patient consultation.   Current medicines are reviewed at length with the patient today.  (+/- concerns) BP starting to get higher. The following changes have been made:  see below   Patient Instructions  Medication Instructions:   Start taking  ROSUVASTATIN 20 mg everyday  Start taking LOSARTAN 25 MG ( 1/2 TABLET) for the first 2 weeks then take one whole tablet daily.  If you need a refill on your cardiac medications before your next appointment, please call your pharmacy.   Lab work:  Not needed  If you have labs (blood work) drawn today and your tests are completely normal, you will receive your results only by: Marland Kitchen MyChart Message (if you have MyChart) OR . A paper copy in the mail If you have any lab test that is abnormal or we need to change your treatment, we will call you to review the results.  Testing/Procedures: Not needed  Follow-Up: At [facility], you and your health needs are our priority.  As part of our continuing mission to provide you with exceptional heart care, we have created designated Provider Care Teams.  These Care Teams include your primary Cardiologist (physician) and Advanced Practice Providers (APPs -  Physician Assistants and Nurse Practitioners) who all work together to provide you with the care you need, when you need it. You will need a follow up appointment in 12 months jan 2021.  Please call our office 2 months in advance  to schedule this appointment.  You may see Dr Ellyn Hack or one of the following Advanced Practice Providers on your designated Care Team:   Rosaria Ferries,  PA-C . Jory Sims, DNP, ANP  Any Other Special Instructions Will Be Listed Below (If Applicable).     Studies Ordered:   Orders Placed This Encounter  Procedures  . EKG 12-Lead      Glenetta Hew, M.D., M.S. Interventional Cardiologist   Pager # [phone] Phone # [phone] [address] [place], [place] [address]   Thank you for choosing Heartcare at [facility]!!

## 2019-01-06 ENCOUNTER — Encounter: Payer: Self-pay | Admitting: Cardiology

## 2019-01-06 DIAGNOSIS — I2510 Atherosclerotic heart disease of native coronary artery without angina pectoris: Secondary | ICD-10-CM | POA: Insufficient documentation

## 2019-01-06 NOTE — Assessment & Plan Note (Signed)
Blood pressure is high today, and he is concerned that his pressures have been going up of late. Plan: We will start low-dose losartan and titrate from 12.5 mg to 25 mg.

## 2019-01-06 NOTE — Assessment & Plan Note (Signed)
A1c was 5.5.  Most recent glucose level was not high.  Monitor as this would increase his risk and placing and borderline in the metabolic syndrome category.

## 2019-01-06 NOTE — Assessment & Plan Note (Signed)
He does have a significant family history of CAD and now has evidence of coronary artery calcification.  HDL is at goal, LDL is 87.  This is with diet and exercise, but I do agree that is probably reasonable to start rosuvastatin now that was prescribed by his PCP.  Hopefully the plan will be to drive his LDL below 70 and that can prevent progression of disease.

## 2019-01-06 NOTE — Assessment & Plan Note (Signed)
Intermediate risk coronary calcium score of 156.  This is more than 1 would expect with just minimal risk.  As such, I do agree with starting him on a statin. We will add ARB (not beta-blocker because a rate of 61 bpm. Also discussed daily aspirin.  The plan will be to reassess coronary calcium score in a couple years to see if there is been any progression of disease.  This will also help [place] see the borderline dilation of the thoracic aorta.

## 2019-01-19 ENCOUNTER — Other Ambulatory Visit: Payer: Self-pay | Admitting: Family Medicine

## 2019-01-29 ENCOUNTER — Other Ambulatory Visit: Payer: Self-pay | Admitting: Family Medicine

## 2019-03-27 ENCOUNTER — Encounter: Payer: Self-pay | Admitting: Family Medicine

## 2019-03-27 ENCOUNTER — Ambulatory Visit (INDEPENDENT_AMBULATORY_CARE_PROVIDER_SITE_OTHER): Payer: No Typology Code available for payment source | Admitting: Family Medicine

## 2019-03-27 VITALS — BP 121/72 | Temp 99.0°F | Ht 68.0 in | Wt 182.0 lb

## 2019-03-27 DIAGNOSIS — B9689 Other specified bacterial agents as the cause of diseases classified elsewhere: Secondary | ICD-10-CM

## 2019-03-27 DIAGNOSIS — J329 Chronic sinusitis, unspecified: Secondary | ICD-10-CM | POA: Diagnosis not present

## 2019-03-27 DIAGNOSIS — R0981 Nasal congestion: Secondary | ICD-10-CM

## 2019-03-27 DIAGNOSIS — I10 Essential (primary) hypertension: Secondary | ICD-10-CM

## 2019-03-27 MED ORDER — AMOXICILLIN-POT CLAVULANATE 875-125 MG PO TABS: 1.0000 | ORAL_TABLET | Freq: Two times a day (BID) | ORAL | 0 refills | Status: DC

## 2019-03-27 NOTE — Progress Notes (Signed)
Phone [phone]   Subjective:  Virtual visit via Video note. Chief complaint: Chief Complaint  Patient presents with   Sinus Problem    X 3 TO 4 MONTHS    This visit type was conducted due to national recommendations for restrictions regarding the COVID-19 Pandemic (e.g. social distancing).  This format is felt to be most appropriate for this patient at this time balancing risks to patient and risks to population by having him in for in person visit.  All issues noted in this document were discussed and addressed.  No physical exam was performed (except for noted visual exam or audio findings with Telehealth visits).  The patient has consented to conduct a Telehealth visit and understands insurance will be billed.   Our team/I connected with Park Pope on 03/27/19 at  4:20 PM EDT by a video enabled telemedicine application ([email or web address]) and verified that I am speaking with the correct person using two identifiers.  Location patient: [facility] Location provider: [facility], office Persons participating in the virtual visit:  patient  Our team/I discussed the limitations of evaluation and management by telemedicine and the availability of in person appointments. In light of current covid-19 pandemic, patient also understands that we are trying to protect them by minimizing in office contact if at all possible.  The patient expressed consent for telemedicine visit and agreed to proceed. Patient understands insurance will be billed.   ROS- no fever, chills. Has had sinus congestion particularly on the left. No shortness of breath reported.    Past Medical History-  Patient Active Problem List   Diagnosis Date Noted   Prostate cancer ([place]) 05/25/2018    Priority: High   Hyperglycemia 05/17/2017    Priority: Medium   History of skin cancer 06/15/2016    Priority: Medium   Essential hypertension 06/09/2015    Priority: Medium   Hyperlipidemia 02/28/2008    Priority: Medium    History of adenomatous polyp of colon 10/27/2018    Priority: Low   Coronary artery calcification seen on CAT scan    Anxiety 10/27/2018   Rotator cuff syndrome of left shoulder 03/16/2016   Acromioclavicular joint arthritis 03/16/2016   Anterior tibialis tendinitis of left leg 01/02/2015    Medications- reviewed and updated Current Outpatient Medications  Medication Sig Dispense Refill   aspirin 81 MG tablet Take 81 mg by mouth daily.      cyclobenzaprine (FLEXERIL) 10 MG tablet Take 1 tablet (10 mg total) by mouth 3 (three) times daily as needed for muscle spasms. 30 tablet 0   losartan (COZAAR) 25 MG tablet Take 1 tablet (25 mg total) by mouth daily. 90 tablet 3   rosuvastatin (CRESTOR) 20 MG tablet TAKE 1 TABLET BY MOUTH EVERY DAY 30 tablet 4   traZODone (DESYREL) 50 MG tablet TAKE 0.5-1 TABLETS (25-50 MG TOTAL) BY MOUTH AT BEDTIME AS NEEDED FOR SLEEP. 90 tablet 2   amoxicillin-clavulanate (AUGMENTIN) 875-125 MG tablet Take 1 tablet by mouth 2 (two) times daily. 20 tablet 0   No current facility-administered medications for this visit.      Objective:  BP 121/72    Temp 99 F (37.2 C) (Temporal)    Ht 5\' 8"  (1.727 m)    Wt 182 lb (82.6 kg)    BMI 27.67 kg/m  Gen: NAD, resting comfortably Points to left maxillary sinus as area of congestion, normal external nares Lungs: nonlabored, normal respiratory rate  Skin: appears dry, no obvious rash     Assessment  and Plan   # Sinus congestion S:patient has had issues for 2-3 months if not 4 months. Feels closed up in left nasal passage- in back of throat constantly feels like a post nasal drip or even "clumps" of discharge. Has tried saline nasal rinse and that doesn't help a whole lot. Seems to get better then worse. Never had lung issues. Not coughing with it significantly- perhaps mild intermittent cough- but he wonders if that's pollen related. Not on any allergy medicine.Never had allergies before. Admits to Thick nasal  discharge- green discharge  Tried to dive in [place] recently- couldn't clear ears in a dive so couldn't dive.  Both of ears would not clear at that time.   Started in winter time frame- not in high allergy season. Doesn't remember a cold before it started  Can move air on left nostril but more blocked up. Never had deviated septum. No nasal trauma.  A/P: 3-4 months of left maxillary sinus pressure- I believe a 10 day cours eof augmentin is reasonable. Would try antihistamine like zyrtec in combo with flonase for 3 weeks if that fails- if both fail would likely refer to ENT for their opinion (but he strongly wants to stay out of doctors offices with Covid 19 pandemic if at all possible)    #hypertension S: controlled on losartan 25 mg daily. Averaged last 10 BP and 133/80 (see mychart message BP Readings from Last 3 Encounters:  03/27/19 121/72  01/03/19 (!) 148/82  10/27/18 132/80  A/P: doing great- continue current meds   Future Appointments  Date Time Provider [place]  10/29/2019  8:15 AM [facility]  11/01/2019  9:20 AM Marin Olp, MD [facility]   Lab/Order associations: Bacterial sinusitis  Sinus congestion  Essential hypertension  Meds ordered this encounter  Medications   amoxicillin-clavulanate (AUGMENTIN) 875-125 MG tablet    Sig: Take 1 tablet by mouth 2 (two) times daily.    Dispense:  20 tablet    Refill:  0    Return precautions advised.  Garret Reddish, MD

## 2019-03-27 NOTE — Patient Instructions (Signed)
Video visit- return precautions given verbally

## 2019-04-04 ENCOUNTER — Encounter: Payer: Self-pay | Admitting: Family Medicine

## 2019-07-13 ENCOUNTER — Encounter: Payer: Self-pay | Admitting: Family Medicine

## 2019-08-14 ENCOUNTER — Other Ambulatory Visit: Payer: Self-pay | Admitting: Family Medicine

## 2019-09-24 NOTE — Addendum Note (Signed)
Addended by: Marin Olp on: 09/24/2019 09:21 PM   Modules accepted: Orders

## 2019-10-08 ENCOUNTER — Ambulatory Visit (INDEPENDENT_AMBULATORY_CARE_PROVIDER_SITE_OTHER): Payer: Managed Care, Other (non HMO)

## 2019-10-08 ENCOUNTER — Other Ambulatory Visit: Payer: Self-pay

## 2019-10-08 ENCOUNTER — Encounter: Payer: Self-pay | Admitting: Family Medicine

## 2019-10-08 DIAGNOSIS — Z23 Encounter for immunization: Secondary | ICD-10-CM | POA: Diagnosis not present

## 2019-10-15 ENCOUNTER — Other Ambulatory Visit: Payer: Self-pay | Admitting: Family Medicine

## 2019-10-15 ENCOUNTER — Other Ambulatory Visit: Payer: Self-pay | Admitting: Cardiology

## 2019-10-23 ENCOUNTER — Encounter: Payer: Self-pay | Admitting: Family Medicine

## 2019-10-25 ENCOUNTER — Encounter: Payer: Self-pay | Admitting: Family Medicine

## 2019-10-29 ENCOUNTER — Other Ambulatory Visit: Payer: Self-pay

## 2019-10-29 ENCOUNTER — Other Ambulatory Visit (INDEPENDENT_AMBULATORY_CARE_PROVIDER_SITE_OTHER): Payer: Managed Care, Other (non HMO)

## 2019-10-29 DIAGNOSIS — E785 Hyperlipidemia, unspecified: Secondary | ICD-10-CM

## 2019-10-29 DIAGNOSIS — I10 Essential (primary) hypertension: Secondary | ICD-10-CM | POA: Diagnosis not present

## 2019-10-29 DIAGNOSIS — Z0000 Encounter for general adult medical examination without abnormal findings: Secondary | ICD-10-CM | POA: Diagnosis not present

## 2019-10-29 DIAGNOSIS — R739 Hyperglycemia, unspecified: Secondary | ICD-10-CM

## 2019-10-29 DIAGNOSIS — C61 Malignant neoplasm of prostate: Secondary | ICD-10-CM

## 2019-10-29 LAB — COMPREHENSIVE METABOLIC PANEL
ALT: 34 U/L (ref 0–53)
AST: 25 U/L (ref 0–37)
Albumin: 4.4 g/dL (ref 3.5–5.2)
Alkaline Phosphatase: 54 U/L (ref 39–117)
BUN: 15 mg/dL (ref 6–23)
CO2: 26 mEq/L (ref 19–32)
Calcium: 9.1 mg/dL (ref 8.4–10.5)
Chloride: 102 mEq/L (ref 96–112)
Creatinine, Ser: 0.81 mg/dL (ref 0.40–1.50)
GFR: 96.32 mL/min (ref >60.00)
Glucose, Bld: 104 mg/dL — ABNORMAL HIGH (ref 70–99)
Potassium: 4.3 mEq/L (ref 3.5–5.1)
Sodium: 137 mEq/L (ref 135–145)
Total Bilirubin: 1 mg/dL (ref 0.2–1.2)
Total Protein: 6.7 g/dL (ref 6.0–8.3)

## 2019-10-29 LAB — LIPID PANEL
Cholesterol: 147 mg/dL (ref 0–200)
HDL: 46.6 mg/dL (ref >39.00)
LDL Cholesterol: 81 mg/dL (ref 0–99)
NonHDL: 100.11
Total CHOL/HDL Ratio: 3
Triglycerides: 98 mg/dL (ref 0.0–149.0)
VLDL: 19.6 mg/dL (ref 0.0–40.0)

## 2019-10-29 LAB — CBC WITH DIFFERENTIAL/PLATELET
Basophils Absolute: 0 10*3/uL (ref 0.0–0.1)
Basophils Relative: 0.6 % (ref 0.0–3.0)
Eosinophils Absolute: 0.1 10*3/uL (ref 0.0–0.7)
Eosinophils Relative: 2.8 % (ref 0.0–5.0)
HCT: 43.3 % (ref 39.0–52.0)
Hemoglobin: 14.9 g/dL (ref 13.0–17.0)
Lymphocytes Relative: 22.4 % (ref 12.0–46.0)
Lymphs Abs: 1.1 10*3/uL (ref 0.7–4.0)
MCHC: 34.4 g/dL (ref 30.0–36.0)
MCV: 90.6 fl (ref 78.0–100.0)
Monocytes Absolute: 0.5 10*3/uL (ref 0.1–1.0)
Monocytes Relative: 10 % (ref 3.0–12.0)
Neutro Abs: 3 10*3/uL (ref 1.4–7.7)
Neutrophils Relative %: 64.2 % (ref 43.0–77.0)
Platelets: 199 10*3/uL (ref 150.0–400.0)
RBC: 4.78 Mil/uL (ref 4.22–5.81)
RDW: 12.9 % (ref 11.5–15.5)
WBC: 4.7 10*3/uL (ref 4.0–10.5)

## 2019-10-29 LAB — PSA: PSA: 6.53 ng/mL — ABNORMAL HIGH (ref 0.10–4.00)

## 2019-10-29 LAB — HEMOGLOBIN A1C: Hgb A1c MFr Bld: 5.6 % (ref 4.6–6.5)

## 2019-11-01 ENCOUNTER — Other Ambulatory Visit: Payer: Self-pay

## 2019-11-01 ENCOUNTER — Ambulatory Visit (INDEPENDENT_AMBULATORY_CARE_PROVIDER_SITE_OTHER): Payer: Managed Care, Other (non HMO) | Admitting: Family Medicine

## 2019-11-01 ENCOUNTER — Encounter: Payer: Self-pay | Admitting: Family Medicine

## 2019-11-01 VITALS — BP 126/78 | HR 63 | Temp 98.6°F | Ht 68.0 in | Wt 178.6 lb

## 2019-11-01 DIAGNOSIS — I10 Essential (primary) hypertension: Secondary | ICD-10-CM

## 2019-11-01 DIAGNOSIS — Z0000 Encounter for general adult medical examination without abnormal findings: Secondary | ICD-10-CM

## 2019-11-01 DIAGNOSIS — Z86010 Personal history of colonic polyps: Secondary | ICD-10-CM

## 2019-11-01 DIAGNOSIS — R739 Hyperglycemia, unspecified: Secondary | ICD-10-CM

## 2019-11-01 DIAGNOSIS — E785 Hyperlipidemia, unspecified: Secondary | ICD-10-CM

## 2019-11-01 DIAGNOSIS — Z860101 Personal history of adenomatous and serrated colon polyps: Secondary | ICD-10-CM

## 2019-11-01 DIAGNOSIS — Z85828 Personal history of other malignant neoplasm of skin: Secondary | ICD-10-CM

## 2019-11-01 DIAGNOSIS — C61 Malignant neoplasm of prostate: Secondary | ICD-10-CM | POA: Diagnosis not present

## 2019-11-01 NOTE — Patient Instructions (Addendum)
Recommended follow up: He prefers 1 year physical as long as blood pressure less than 140/90 so 138/88 or less-happy to see you in 6 months if you prefer  Reasonable to try flonase for at least 1-2 months to see if that makes a difference with intermittent post nasal drip.   Form/waist circumference completion- please verify for him

## 2019-11-01 NOTE — Progress Notes (Signed)
Phone: [phone]   Subjective:  Patient presents today for their annual physical. Chief complaint-noted.   See problem oriented charting- ROS- full  review of systems was completed and negative  except for: no positives   The following were reviewed and entered/updated in epic: Past Medical History:  Diagnosis Date  . Cancer [facility])    prostate CA- bx done 11-18, hasn't started treatment  . Coronary artery calcification seen on CAT scan    Coronary Calcium Score (November 13, 2018)- 158.  Mild Calcium in all 3 Epicardial coronary arteries.  Slight TA dilation ~4 cm.   . Hyperlipidemia    Rosuvastatin just ordered, but not yet started.  Marland Kitchen NEPHROLITHIASIS, HX OF 02/28/2008   Qualifier: Diagnosis of  By: [name] MD, [name]    Patient Active Problem List   Diagnosis Date Noted  . Prostate cancer ([place]) 05/25/2018    Priority: High  . Coronary artery calcification seen on CAT scan     Priority: Medium  . Hyperglycemia 05/17/2017    Priority: Medium  . History of skin cancer 06/15/2016    Priority: Medium  . Essential hypertension 06/09/2015    Priority: Medium  . Hyperlipidemia 02/28/2008    Priority: Medium  . History of adenomatous polyp of colon 10/27/2018    Priority: Low  . Anxiety 10/27/2018  . Rotator cuff syndrome of left shoulder 03/16/2016  . 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 Anterior tibialis tendinitis of left leg 01/02/2015   Past Surgical History:  Procedure Laterality Date  . A/C repair right  years ago   HS football injury  . COLONOSCOPY    . KNEE ARTHROSCOPY  11/15/2012   meniscus repair- Dr. [name], right  . PROSTATE BIOPSY     November 2018    Family History  Problem Relation Age of Onset  . Heart disease Father        74s heavy smoker  . Prostate cancer Father        67s or 46s  . Hyperlipidemia Father   . Diabetes Mother   . Cancer Mother 27       lung cancer-nonsmoker  . Heart disease Brother   . Colon cancer Neg Hx   .  Esophageal cancer Neg Hx   . Rectal cancer Neg Hx   . Stomach cancer Neg Hx     Medications- reviewed and updated Current Outpatient Medications  Medication Sig Dispense Refill  . aspirin 81 MG tablet Take 81 mg by mouth daily.     . cyclobenzaprine (FLEXERIL) 10 MG tablet Take 1 tablet (10 mg total) by mouth 3 (three) times daily as needed for muscle spasms. 30 tablet 0  . losartan (COZAAR) 25 MG tablet TAKE 1 TABLET BY MOUTH EVERY DAY 90 tablet 0  . rosuvastatin (CRESTOR) 20 MG tablet TAKE 1 TABLET BY MOUTH EVERY DAY 90 tablet 1  . traZODone (DESYREL) 50 MG tablet TAKE 0.5-1 TABLETS (25-50 MG TOTAL) BY MOUTH AT BEDTIME AS NEEDED FOR SLEEP. 90 tablet 1   No current facility-administered medications for this visit.     Allergies-reviewed and updated No Known Allergies  Social History   Social History Narrative   Married 1979. 2 kids. No grandkids.       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 Cobblestoning in throat - possible allergies Neck: no thyromegaly or cervical lymphadenopathy  CV: RRR no murmurs rubs or gallops Lungs: CTAB no crackles, wheeze, rhonchi Abdomen: soft/nontender/nondistended/normal bowel sounds. No rebound or guarding.  Ext: no edema Skin: warm, dry Neuro: grossly normal, moves all extremities, PERRLA   Assessment and Plan  63 y.o. male presenting for annual physical.  Health Maintenance counseling: 1. Anticipatory guidance: Patient counseled regarding regular dental exams q6 months, eye exams yearly,  avoiding smoking and second hand smoke , limiting alcohol to 2 beverages per day .   2. Risk factor reduction:   Advised patient of need for regular exercise and diet rich and fruits and vegetables to reduce risk of heart attack and stroke. Exercise- walks 5 times a week for 45 minutes. Diet-has healthy diet.  Down 7 lbs from January!  Wt Readings from Last 3 Encounters:  11/01/19 178 lb 9.6 oz (81 kg)  03/27/19 182 lb (82.6 kg)  01/03/19 185 lb 3.2 oz (84 kg)  3. Immunizations/screenings/ancillary studies- fully up to date.  Immunization History  Administered Date(s) Administered  . Influenza Split 10/25/2011, 11/03/2012  . Influenza Whole 10/08/2008, 09/23/2009, 09/08/2010  . Influenza,inj,Quad PF,6+ Mos 11/09/2013, 10/14/2014, 08/22/2015, 09/16/2017, 10/27/2018, 10/08/2019  . Influenza-Unspecified 09/14/2016  . Td 02/17/2005  . Tdap 06/09/2015  . Zoster Recombinat (Shingrix) 07/13/2017, 09/16/2017  4.  Prostate cancer (H[facility])-continues with active surveillance with Dr. Karsten Ro  Lab Results  Component Value Date   PSA 6.53 (H) 10/29/2019   PSA 5.33 11/13/2018   PSA 4.68 05/12/2018   5. Colon cancer screening - due for in 05/17/2023- due to  History of adenomatous polyp of colon 6. Skin cancer screening- History of skin cancer followed by Dermatology.  advised regular sunscreen use. Denies worrisome, changing, or new skin lesions.  7.  Never smoker-get UA with urology  Status of chronic or acute concerns   Essential hypertension-compliant with losartan 25 mg and controlled He does check at home. He has not had any elevated readings lately. Denies any swelling of the legs or chest pains.    Hyperlipidemia, unspecified hyperlipidemia type-compliant with rosuvastatin 20 mg and controlled- He has been taking daily with no side effects.  Lab Results  Component Value Date   CHOL 147 10/29/2019   HDL 46.60 10/29/2019   LDLCALC 81 10/29/2019   LDLDIRECT 150.3 11/02/2013   TRIG 98.0 10/29/2019   CHOLHDL 3 10/29/2019   Hyperglycemia/insulin resistance/prediabetes-A1c's have not been elevated but  CBGs have been-patient likes to get A1c yearly.  - he wants a1c each year Lab Results  Component Value Date   HGBA1C 5.6 10/29/2019   HGBA1C 5.5 10/03/2018   HGBA1C 5.5 07/07/2017   Insomnia-trazodone helpful when used- uses most nights    For last year- feels like has slight post nasal drip that comes and goes. No glaucoma history. Has been issues ever since 1st of the year- we even had a video visit several months ago.   Recommended follow up: He prefers 1 year physical as long as blood pressure less than 140/90 so 138/88 or less-happy to see you in 6 months if you prefer  Lab/Order associations: already had labs   ICD-10-CM   1. Preventative health care  Z00.00 CBC with Differential future    CMET future  Lipid future    A1c future  2. Prostate cancer ([place])  C61 PSA future  3. Essential hypertension  I10   4. Hyperlipidemia, unspecified hyperlipidemia type  E78.5 CBC with Differential future    CMET future    Lipid future  5. History of skin cancer  Z85.828   6. Hyperglycemia  R73.9 A1c future  7. History of adenomatous polyp of colon  Z86.010    No orders of the defined types were placed in this encounter.   Return precautions advised.  Garret Reddish, MD

## 2019-11-20 ENCOUNTER — Other Ambulatory Visit: Payer: Self-pay | Admitting: Cardiology

## 2019-12-05 ENCOUNTER — Other Ambulatory Visit: Payer: Self-pay | Admitting: Urology

## 2019-12-05 DIAGNOSIS — C61 Malignant neoplasm of prostate: Secondary | ICD-10-CM

## 2019-12-05 DIAGNOSIS — S0540XA Penetrating wound of orbit with or without foreign body, unspecified eye, initial encounter: Secondary | ICD-10-CM

## 2019-12-11 ENCOUNTER — Other Ambulatory Visit: Payer: Self-pay | Admitting: Family Medicine

## 2019-12-16 ENCOUNTER — Encounter: Payer: Self-pay | Admitting: Family Medicine

## 2019-12-17 ENCOUNTER — Ambulatory Visit: Payer: Managed Care, Other (non HMO) | Attending: Internal Medicine

## 2019-12-17 DIAGNOSIS — U071 COVID-19: Secondary | ICD-10-CM

## 2019-12-19 LAB — NOVEL CORONAVIRUS, NAA: SARS-CoV-2, NAA: NOT DETECTED

## 2019-12-29 ENCOUNTER — Encounter: Payer: Self-pay | Admitting: Family Medicine

## 2020-01-02 ENCOUNTER — Other Ambulatory Visit: Payer: Self-pay

## 2020-01-02 ENCOUNTER — Ambulatory Visit
Admission: RE | Admit: 2020-01-02 | Discharge: 2020-01-02 | Disposition: A | Discharge status: Home / Self Care | Payer: No Typology Code available for payment source | Source: Ambulatory Visit | Attending: Urology | Admitting: Urology

## 2020-01-02 DIAGNOSIS — C61 Malignant neoplasm of prostate: Secondary | ICD-10-CM

## 2020-01-02 DIAGNOSIS — S0540XA Penetrating wound of orbit with or without foreign body, unspecified eye, initial encounter: Secondary | ICD-10-CM

## 2020-01-02 MED ORDER — GADOBENATE DIMEGLUMINE 529 MG/ML IV SOLN
16.0000 mL | Freq: Once | INTRAVENOUS | Status: AC | PRN
  Administered 2020-01-02: 16 mL via INTRAVENOUS

## 2020-01-08 ENCOUNTER — Telehealth: Payer: Self-pay | Admitting: Cardiology

## 2020-01-08 NOTE — Telephone Encounter (Signed)
Spoke to patient to schedule his 1 year follow up with Dr Ellyn Hack.  Patient is scheduled 01/11/2020 but requests we not charge him if he needs to cancel last minute due to his mother's failing health

## 2020-01-11 ENCOUNTER — Ambulatory Visit: Payer: No Typology Code available for payment source | Admitting: Cardiology

## 2020-02-18 ENCOUNTER — Encounter: Payer: Self-pay | Admitting: Family Medicine

## 2020-02-20 ENCOUNTER — Encounter: Payer: Self-pay | Admitting: Cardiology

## 2020-02-20 ENCOUNTER — Ambulatory Visit (INDEPENDENT_AMBULATORY_CARE_PROVIDER_SITE_OTHER): Payer: No Typology Code available for payment source | Admitting: Cardiology

## 2020-02-20 ENCOUNTER — Other Ambulatory Visit: Payer: Self-pay

## 2020-02-20 VITALS — BP 140/82 | HR 56 | Ht 68.0 in | Wt 187.8 lb

## 2020-02-20 DIAGNOSIS — I2510 Atherosclerotic heart disease of native coronary artery without angina pectoris: Secondary | ICD-10-CM

## 2020-02-20 DIAGNOSIS — E782 Mixed hyperlipidemia: Secondary | ICD-10-CM | POA: Diagnosis not present

## 2020-02-20 DIAGNOSIS — I10 Essential (primary) hypertension: Secondary | ICD-10-CM

## 2020-02-20 NOTE — Progress Notes (Signed)
Primary Care Provider: Marin Olp, MD Cardiologist: No primary care provider on file. Electrophysiologist: None  Clinic Note: Chief Complaint  Patient presents with  . Follow-up    Coronary artery calcification on CT with moderate we elevated coronary calcium score.  Risk factors.   HPI:    Patrick [name] is a 64 y.o. male with a PMH notable for family history of CAD along with risk factors of hypertension and hyperlipidemia, and mildly elevated coronary calcium score who presents today for annual follow-up.Patrick [name] was last seen in January 2020 to follow-up his coronary calcium score results.  He was concerned little bit about the coronary artery calcium score and his hypertension with family history of CAD.  He himself is relatively asymptomatic.  We did recommend starting rosuvastatin and add ARB.  Recent Hospitalizations: None  Reviewed  CV studies:    The following studies were reviewed today: (if available, images/films reviewed: From Epic Chart or Care Everywhere) . None:   Interval History:   Patrick [name] presents here today overall doing quite well.  He is not having any major cardiac symptoms.  He walks about 3 miles most days of the week.  He also does exercise at the gym for strength and conditioning.  He has been a little bit out of sorts today, a little bit stressed to getting into her to the visit and having some other social issues going on.  Usually his blood pressures range in the systolic range from [id number].  This is relatively unusual for him to have this high blood pressure.  He is pre much asymptomatic from a cardiac standpoint.  No side effects from the new medications.  No myalgias or arthralgias.  His last LDL was 81 after having started statin.  CV Review of Symptoms (Summary) Cardiovascular ROS: no chest pain or dyspnea on exertion negative for - edema, irregular heartbeat, orthopnea, palpitations, paroxysmal nocturnal dyspnea,  rapid heart rate, shortness of breath or Syncope/near syncope, TIA/amaurosis fugax, claudication  The patient does not have symptoms concerning for COVID-19 infection (fever, chills, cough, or new shortness of breath).  The patient is practicing social distancing & Masking.    REVIEWED OF SYSTEMS   Review of Systems  Constitutional: Negative for malaise/fatigue.  HENT: Negative for congestion and nosebleeds.   Gastrointestinal: Negative for blood in stool and melena.  Genitourinary: Negative for hematuria.  Neurological: Negative for dizziness and focal weakness.   I have reviewed and (if needed) personally updated the patient's problem list, medications, allergies, past medical and surgical history, social and family history.   PAST MEDICAL HISTORY   Past Medical History:  Diagnosis Date  . Cancer [facility])    prostate CA- bx done 11-18, hasn't started treatment  . Coronary artery calcification seen on CAT scan    Coronary Calcium Score (November 13, 2018)- 158.  Mild Calcium in all 3 Epicardial coronary arteries.  Slight TA dilation ~4 cm.   . Hyperlipidemia    Rosuvastatin just ordered, but not yet started.  Marland Kitchen NEPHROLITHIASIS, HX OF 02/28/2008   Qualifier: Diagnosis of  By: [name] MD, [name]     PAST SURGICAL HISTORY   Past Surgical History:  Procedure Laterality Date  . A/C repair right  years ago   HS football injury  . COLONOSCOPY    . KNEE ARTHROSCOPY  11/15/2012   meniscus repair- Dr. [name], right  . PROSTATE BIOPSY     November 2018    MEDICATIONS/ALLERGIES  Current Meds  Medication Sig  . aspirin 81 MG tablet Take 81 mg by mouth daily.   . cyclobenzaprine (FLEXERIL) 10 MG tablet Take 1 tablet (10 mg total) by mouth 3 (three) times daily as needed for muscle spasms.  Marland Kitchen losartan (COZAAR) 25 MG tablet TAKE 1 TABLET BY MOUTH EVERY DAY  . rosuvastatin (CRESTOR) 20 MG tablet TAKE 1 TABLET BY MOUTH EVERY DAY  . traZODone (DESYREL) 50 MG tablet TAKE 0.5-1 TABLETS  (25-50 MG TOTAL) BY MOUTH AT BEDTIME AS NEEDED FOR SLEEP.    No Known Allergies  SOCIAL HISTORY/FAMILY HISTORY   Reviewed in Epic:  Pertinent findings: No notable change  OBJCTIVE -PE, EKG, labs   Wt Readings from Last 3 Encounters:  02/25/20 187 lb (84.8 kg)  02/20/20 187 lb 12.8 oz (85.2 kg)  11/01/19 178 lb 9.6 oz (81 kg)    Physical Exam: BP 140/82   Pulse (!) 56   Ht 5\' 8"  (1.727 m)   Wt 187 lb 12.8 oz (85.2 kg)   BMI 28.55 kg/m  Physical Exam  Constitutional: He is oriented to person, place, and time. He appears well-developed and well-nourished. No distress.  Healthy-appearing.  Well-groomed  Neck: No JVD present. Carotid bruit is not present.  Cardiovascular: Normal rate, normal heart sounds and intact distal pulses.  No extrasystoles are present. PMI is not displaced. Exam reveals no gallop and no friction rub.  No murmur heard. Pulmonary/Chest: Effort normal and breath sounds normal. No respiratory distress. He has no wheezes. He has no rales.  Musculoskeletal:        General: No edema. Normal range of motion.     Cervical back: Normal range of motion and neck supple.  Neurological: He is alert and oriented to person, place, and time.  Psychiatric: He has a normal mood and affect. His behavior is normal. Judgment and thought content normal.  Vitals reviewed.    Adult ECG Report  Rate: 56 ;  Rhythm: sinus bradycardia and Otherwise normal axis, intervals and durations.;   Narrative Interpretation: Stable  Recent Labs:    Lab Results  Component Value Date   CHOL 147 10/29/2019   HDL 46.60 10/29/2019   LDLCALC 81 10/29/2019   LDLDIRECT 150.3 11/02/2013   TRIG 98.0 10/29/2019   CHOLHDL 3 10/29/2019   Lab Results  Component Value Date   CREATININE 0.81 10/29/2019   BUN 15 10/29/2019   NA 137 10/29/2019   K 4.3 10/29/2019   CL 102 10/29/2019   CO2 26 10/29/2019   Lab Results  Component Value Date   TSH 2.52 06/08/2016    ASSESSMENT/PLAN     Problem List Items Addressed This Visit    Hyperlipidemia (Chronic)    Lipids look better having started statin.  Continue to monitor.  Goal for him should be trying to reach 70 if not below.  Below 100 is reasonable. Continue current dose statin      Essential hypertension (Chronic)    Borderline elevated blood pressure today.  Seems to doing well on losartan.  We will continue to monitor.  Low threshold to titrate up shooting for blood pressure less than 130/70.  Currently his pressures at home seem to be in this range.      Relevant Orders   EKG 12-Lead (Completed)   Coronary artery calcification seen on CAT scan - Primary (Chronic)    Low to intermediate risk score coronary calcium findings.  No evidence of any anginal symptoms.  He does have  risk factors which we are treating accordingly with blood pressure control and statin.  No clear indication for aspirin, however probably warranted.  He is on ARB and statin.  No beta-blocker because of baseline bradycardia.      Relevant Orders   EKG 12-Lead (Completed)       COVID-19 Education: The signs and symptoms of COVID-19 were discussed with the patient and how to seek care for testing (follow up with PCP or arrange E-visit).   The importance of social distancing was discussed today.  I spent a total of 16 minutes with the patient. >  50% of the time was spent in direct patient consultation.  Additional time spent with chart review  / charting (studies, outside notes, etc): 4 Total Time: 20 min   Current medicines are reviewed at length with the patient today.  (+/- concerns) N/A   Patient Instructions / Medication Changes & Studies & Tests Ordered   Patient Instructions  Medication Instructions:  NO CHANGES  *If you need a refill on your cardiac medications before your next appointment, please call your pharmacy*   Lab Work: NOT NEEDED  Testing/Procedures: NOT NEEDED   Follow-Up: At [facility], you  and your health needs are our priority.  As part of our continuing mission to provide you with exceptional heart care, we have created designated Provider Care Teams.  These Care Teams include your primary Cardiologist (physician) and Advanced Practice Providers (APPs -  Physician Assistants and Nurse Practitioners) who all work together to provide you with the care you need, when you need it.    Your next appointment:   12 month(s)  The format for your next appointment:   In Person  Provider:   Glenetta Hew, MD   Other Instructions N/A    Studies Ordered:   Orders Placed This Encounter  Procedures  . EKG 12-Lead     Glenetta Hew, M.D., M.S. Interventional Cardiologist   Pager # [phone] Phone # [phone] [address] [place], [place] [address]   Thank you for choosing Heartcare at [facility]!!

## 2020-02-20 NOTE — Patient Instructions (Signed)
Medication Instructions:  NO CHANGES  *If you need a refill on your cardiac medications before your next appointment, please call your pharmacy*   Lab Work: NOT NEEDED   Testing/Procedures: NOT NEEDED   Follow-Up: At [facility], you and your health needs are our priority.  As part of our continuing mission to provide you with exceptional heart care, we have created designated Provider Care Teams.  These Care Teams include your primary Cardiologist (physician) and Advanced Practice Providers (APPs -  Physician Assistants and Nurse Practitioners) who all work together to provide you with the care you need, when you need it.   Your next appointment:   12 month(s)  The format for your next appointment:   In Person  Provider:   David Harding, MD   Other Instructions N/A 

## 2020-02-21 ENCOUNTER — Encounter: Payer: Self-pay | Admitting: Family Medicine

## 2020-02-25 ENCOUNTER — Ambulatory Visit (INDEPENDENT_AMBULATORY_CARE_PROVIDER_SITE_OTHER): Payer: No Typology Code available for payment source

## 2020-02-25 ENCOUNTER — Ambulatory Visit: Payer: Self-pay

## 2020-02-25 ENCOUNTER — Ambulatory Visit (INDEPENDENT_AMBULATORY_CARE_PROVIDER_SITE_OTHER): Payer: No Typology Code available for payment source | Admitting: Family Medicine

## 2020-02-25 ENCOUNTER — Other Ambulatory Visit: Payer: Self-pay

## 2020-02-25 ENCOUNTER — Encounter: Payer: Self-pay | Admitting: Family Medicine

## 2020-02-25 VITALS — BP 122/84 | HR 57 | Ht 68.0 in | Wt 187.0 lb

## 2020-02-25 DIAGNOSIS — M79644 Pain in right finger(s): Secondary | ICD-10-CM

## 2020-02-25 DIAGNOSIS — G8929 Other chronic pain: Secondary | ICD-10-CM | POA: Diagnosis not present

## 2020-02-25 DIAGNOSIS — S63641A Sprain of metacarpophalangeal joint of right thumb, initial encounter: Secondary | ICD-10-CM | POA: Diagnosis not present

## 2020-02-25 NOTE — Progress Notes (Signed)
[place] Phone: [phone] Subjective:   Fontaine No, am serving as a scribe for Dr. Hulan Saas. This visit occurred during the SARS-CoV-2 public health emergency.  Safety protocols were in place, including screening questions prior to the visit, additional usage of staff PPE, and extensive cleaning of exam room while observing appropriate contact time as indicated for disinfecting solutions.    I'm seeing this patient by the request  of:  Patrick Olp, MD  CC: Right thumb pain  [id number]  DEVARIUS [name] is a 64 y.o. male coming in with complaint of right thumb pain. Pain over palmer aspect of [facility] joint for 2 weeks. Pain is constant and achy.  Patient does not remember any true injury.  Last Saturday with some increasing work and feels like that seem to contribute to more pain and maybe some swelling.  Rates the severity of pain is 6 out of 10.  No numbness associated with it.  No fevers chills or any abnormal weight loss.  Past medical history significant for prostate cancer    Past Medical History:  Diagnosis Date  . Cancer [facility])    prostate CA- bx done 11-18, hasn't started treatment  . Coronary artery calcification seen on CAT scan    Coronary Calcium Score (November 13, 2018)- 158.  Mild Calcium in all 3 Epicardial coronary arteries.  Slight TA dilation ~4 cm.   . Hyperlipidemia    Rosuvastatin just ordered, but not yet started.  Marland Kitchen NEPHROLITHIASIS, HX OF 02/28/2008   Qualifier: Diagnosis of  By: [name] MD, [name]    Past Surgical History:  Procedure Laterality Date  . A/C repair right  years ago   HS football injury  . COLONOSCOPY    . KNEE ARTHROSCOPY  11/15/2012   meniscus repair- Dr. [name], right  . PROSTATE BIOPSY     November 2018   Social History   Socioeconomic History  . Marital status: Married    Spouse name: Not on file  . Number of children: Not on file  . Years of  education: Not on file  . Highest education level: Not on file  Occupational History  . Occupation: [id number]    Employer: [employer]  . Occupation: MECHANICAL EQUIPMENT SALES  Tobacco Use  . Smoking status: Never Smoker  . Smokeless tobacco: Never Used  Substance and Sexual Activity  . Alcohol use: Yes    Comment: occasional  . Drug use: No  . Sexual activity: Not on file  Other Topics Concern  . Not on file  Social History Narrative   Married 1979. 2 kids. No grandkids.       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 Difficulty of Paying Living Expenses: Not on file  Food Insecurity:   . Worried About Charity fundraiser in the Last Year: Not on file  . Ran Out of Food in the Last Year: Not on file  Transportation Needs:   . Lack of Transportation (Medical): Not on file  . Lack of Transportation (Non-Medical): Not on file  Physical Activity:   . Days of Exercise per Week: Not on file  . Minutes of Exercise per Session: Not on file  Stress:   . Feeling of Stress : Not on file  Social Connections:   . Frequency of Communication with Friends and Family: Not on file  . Frequency of Social Gatherings with Friends and Family: Not on file  . Attends Religious Services: Not on file  . Active Member of Clubs or Organizations: Not on file  . Attends Archivist Meetings: Not on file  . Marital Status: Not on file   No Known Allergies Family History  Problem Relation Age of Onset  . Heart disease Father        92s heavy smoker  . Prostate cancer Father        45s or 53s  . Hyperlipidemia Father   . Diabetes Mother   . Cancer Mother 36       lung cancer-nonsmoker. 89 in 2020- toward end of life  . Heart disease Brother   . Colon cancer Neg Hx   . Esophageal cancer Neg Hx   .  Rectal cancer Neg Hx   . Stomach cancer Neg Hx      Current Outpatient Medications (Cardiovascular):  .  losartan (COZAAR) 25 MG tablet, TAKE 1 TABLET BY MOUTH EVERY DAY .  rosuvastatin (CRESTOR) 20 MG tablet, TAKE 1 TABLET BY MOUTH EVERY DAY   Current Outpatient Medications (Analgesics):  .  aspirin 81 MG tablet, Take 81 mg by mouth daily.    Current Outpatient Medications (Other):  .  cyclobenzaprine (FLEXERIL) 10 MG tablet, Take 1 tablet (10 mg total) by mouth 3 (three) times daily as needed for muscle spasms. .  traZODone (DESYREL) 50 MG tablet, TAKE 0.5-1 TABLETS (25-50 MG TOTAL) BY MOUTH AT BEDTIME AS NEEDED FOR SLEEP.   Reviewed prior external information including notes and imaging from  primary care provider As well as notes that were available from care everywhere and other healthcare systems.  Past medical history, social, surgical and family history all reviewed in electronic medical record.  No pertanent information unless stated regarding to the chief complaint.   Review of Systems:  No headache, visual changes, nausea, vomiting, diarrhea, constipation, dizziness, abdominal pain, skin rash, fevers, chills, night sweats, weight loss, swollen lymph nodes, body aches, joint swelling, chest pain, shortness of breath, mood changes. POSITIVE muscle aches  Objective  Blood pressure 122/84, pulse (!) 57, height 5\' 8"  (1.727 m), weight 187 lb (84.8 kg), SpO2 98 %.   General: No apparent distress alert and oriented x3 mood and affect normal, dressed appropriately.  HEENT: Pupils equal, extraocular movements intact  Respiratory: Patient's speak in full sentences and does not appear short of breath  Cardiovascular: No lower extremity edema, non tender, no erythema  Skin: Warm dry intact with no signs of infection or rash on extremities or on axial skeleton.  Abdomen: Soft nontender  Neuro: Cranial nerves II through XII are intact, neurovascularly intact in all extremities with  2+ DTRs and 2+ pulses.  Lymph: No lymphadenopathy of posterior or anterior cervical chain or axillae bilaterally.  Gait normal with good balance and coordination.  MSK:  Non tender with full range of motion and good stability and symmetric strength and tone of shoulders, elbows, hip, knee and ankles bilaterally.  Right hand exam shows some very trace swelling around the [facility].  Negative grind but patient does have significant laxity with no endpoint of the UCL on the right side and causing increasing discomfort and pain.  Limited musculoskeletal ultrasound was performed and interpreted by [name]  Limited ultrasound of patient's thumb shows patient [facility] joint is fairly unremarkable.  A small potential avulsion along the ulnar side of the [facility] where the UCL would attach.  Patient does have a near full-thickness tear noted.  Hypoechoic changes superficially to this area.  Increasing Doppler flow to where the largest area of the tear is. Impression: UCL rupture near full-thickness   Impression and Recommendations:     This case required medical decision making of moderate complexity. The above documentation has been reviewed and is accurate and complete [name], DO       Note: This dictation was prepared with Dragon dictation along with smaller phrase technology. Any transcriptional errors that result from this process are unintentional.

## 2020-02-25 NOTE — Patient Instructions (Signed)
Wear brace day and night as much as possible for next 2 weeks Xray today See me in 2-3 weeks

## 2020-02-25 NOTE — Assessment & Plan Note (Signed)
Patient has no instability of the joint noted.  Patient may have even a small avulsion.  X-rays pending.  Patient does have some instability and will be put in a thumb spica splint.  Discussed trying not to remove it for the next 2 weeks and wearing a day and night.  Discussed icing as necessary.  Patient should do relatively well with conservative therapy.  Patient does have a past medical history significant for prostate cancer so x-rays are important.  Follow-up with me again in 4 to 8 weeks.

## 2020-02-27 ENCOUNTER — Encounter: Payer: Self-pay | Admitting: Cardiology

## 2020-02-27 NOTE — Assessment & Plan Note (Signed)
Borderline elevated blood pressure today.  Seems to doing well on losartan.  We will continue to monitor.  Low threshold to titrate up shooting for blood pressure less than 130/70.  Currently his pressures at home seem to be in this range.

## 2020-02-27 NOTE — Assessment & Plan Note (Signed)
Low to intermediate risk score coronary calcium findings.  No evidence of any anginal symptoms.  He does have risk factors which we are treating accordingly with blood pressure control and statin.  No clear indication for aspirin, however probably warranted.  He is on ARB and statin.  No beta-blocker because of baseline bradycardia.

## 2020-02-27 NOTE — Assessment & Plan Note (Signed)
Lipids look better having started statin.  Continue to monitor.  Goal for him should be trying to reach 70 if not below.  Below 100 is reasonable. Continue current dose statin

## 2020-03-10 NOTE — Progress Notes (Signed)
[place] Phone: [phone] Subjective:   Patrick [name], am serving as a scribe for Dr. Hulan Saas. This visit occurred during the SARS-CoV-2 public health emergency.  Safety protocols were in place, including screening questions prior to the visit, additional usage of staff PPE, and extensive cleaning of exam room while observing appropriate contact time as indicated for disinfecting solutions.   I'm seeing this patient by the request  of:  Marin Olp, MD  CC: right thumb pain   [id number]   02/25/2020 Patient has [name] instability of the joint noted.  Patient may have even a small avulsion.  X-rays pending.  Patient does have some instability and will be put in a thumb spica splint.  Discussed trying not to remove it for the next 2 weeks and wearing a day and night.  Discussed icing as necessary.  Patient should do relatively well with conservative therapy.  Patient does have a past medical history significant for prostate cancer so x-rays are important.  Follow-up with me again in 4 to 8 weeks.  Update 03/11/2020 Patrick [name] is a 64 y.o. male coming in with complaint of right thumb pain. Patient states that his pain has gone down significantly since last visit. Does still have an achiness in the thumb. Has worn the brace daily since last visit. Has not tried to do anything out of the brace     Past Medical History:  Diagnosis Date  . Cancer [facility])    prostate CA- bx done 11-18, hasn't started treatment  . Coronary artery calcification seen on CAT scan    Coronary Calcium Score (November 13, 2018)- 158.  Mild Calcium in all 3 Epicardial coronary arteries.  Slight TA dilation ~4 cm.   . Hyperlipidemia    Rosuvastatin just ordered, but not yet started.  Marland Kitchen NEPHROLITHIASIS, HX OF 02/28/2008   Qualifier: Diagnosis of  By: [name] MD, [name]    Past Surgical History:  Procedure Laterality Date  . A/C  repair right  years ago   HS football injury  . COLONOSCOPY    . KNEE ARTHROSCOPY  11/15/2012   meniscus repair- Dr. [name], right  . PROSTATE BIOPSY     November 2018   Social History   Socioeconomic History  . Marital status: Married    Spouse name: Not on file  . Number of children: Not on file  . Years of education: Not on file  . Highest education level: Not on file  Occupational History  . Occupation: [phone]    Employer: [employer]  . Occupation: MECHANICAL EQUIPMENT SALES  Tobacco Use  . Smoking status: Never Smoker  . Smokeless tobacco: Never Used  Substance and Sexual Activity  . Alcohol use: Yes    Comment: occasional  . Drug use: [name]  . Sexual activity: Not on file  Other Topics Concern  . Not on file  Social History Narrative   Married 1979. 2 kids. [name] grandkids.       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 Difficulty of Paying Living Expenses:   Food Insecurity:   . Worried About Charity fundraiser in the Last Year:   . Arboriculturist in the Last Year:   Transportation Needs:   . Lack  of Transportation (Medical):   Marland Kitchen Lack of Transportation (Non-Medical):   Physical Activity:   . Days of Exercise per Week:   . Minutes of Exercise per Session:   Stress:   . Feeling of Stress :   Social Connections:   . Frequency of Communication with Friends and Family:   . Frequency of Social Gatherings with Friends and Family:   . Attends Religious Services:   . Active Member of Clubs or Organizations:   . Attends Archivist Meetings:   Marland Kitchen Marital Status:    [name] Known Allergies Family History  Problem Relation Age of Onset  . Heart disease Father        49s heavy smoker  . Prostate cancer Father        84s or 56s  . Hyperlipidemia Father   . Diabetes  Mother   . Cancer Mother 59       lung cancer-nonsmoker. 89 in 2020- toward end of life  . Heart disease Brother   . Colon cancer Neg Hx   . Esophageal cancer Neg Hx   . Rectal cancer Neg Hx   . Stomach cancer Neg Hx      Current Outpatient Medications (Cardiovascular):  .  losartan (COZAAR) 25 MG tablet, TAKE 1 TABLET BY MOUTH EVERY DAY .  rosuvastatin (CRESTOR) 20 MG tablet, TAKE 1 TABLET BY MOUTH EVERY DAY   Current Outpatient Medications (Analgesics):  .  aspirin 81 MG tablet, Take 81 mg by mouth daily.    Current Outpatient Medications (Other):  .  cyclobenzaprine (FLEXERIL) 10 MG tablet, Take 1 tablet (10 mg total) by mouth 3 (three) times daily as needed for muscle spasms. .  traZODone (DESYREL) 50 MG tablet, TAKE 0.5-1 TABLETS (25-50 MG TOTAL) BY MOUTH AT BEDTIME AS NEEDED FOR SLEEP.   Reviewed prior external information including notes and imaging from  primary care provider As well as notes that were available from care everywhere and other healthcare systems.  Past medical history, social, surgical and family history all reviewed in electronic medical record.  [name] pertanent information unless stated regarding to the chief complaint.   Review of Systems:  [name] headache, visual changes, nausea, vomiting, diarrhea, constipation, dizziness, abdominal pain, skin rash, fevers, chills, night sweats, weight loss, swollen lymph nodes, body aches, joint swelling, chest pain, shortness of breath, mood changes. POSITIVE muscle aches  Objective  Blood pressure 126/82, pulse 63, height 5\' 8"  (1.727 m), weight 185 lb (83.9 kg), SpO2 99 %.   General: [name] apparent distress alert and oriented x3 mood and affect normal, dressed appropriately.  HEENT: Pupils equal, extraocular movements intact  Respiratory: Patient's speak in full sentences and does not appear short of breath  Cardiovascular: [name] lower extremity edema, non tender, [name] erythema  Neuro: Cranial nerves II through XII are  intact, neurovascularly intact in all extremities with 2+ DTRs and 2+ pulses.  Gait normal with good balance and coordination.  MSK:  Non tender with full range of motion and good stability and symmetric strength and tone of shoulders, elbows, wrist, hip, knee and ankles bilaterally.  Right thumb [name] swelling, still laxity noted of UCL, less TTP.  Good grip stregth, NVI distally   Ltd msk [place]:  Limited ultrasound shows significant increase in vascular flow to area, mild improvement in striation of the UCL, continued gapping though on dynamic testing Impression: interval healing    Impression and Recommendations:     The above documentation has been reviewed and is  accurate and complete [name], DO       Note: This dictation was prepared with Dragon dictation along with smaller phrase technology. Any transcriptional errors that result from this process are unintentional.

## 2020-03-11 ENCOUNTER — Ambulatory Visit (INDEPENDENT_AMBULATORY_CARE_PROVIDER_SITE_OTHER): Payer: No Typology Code available for payment source | Admitting: Family Medicine

## 2020-03-11 ENCOUNTER — Ambulatory Visit (INDEPENDENT_AMBULATORY_CARE_PROVIDER_SITE_OTHER): Payer: No Typology Code available for payment source

## 2020-03-11 ENCOUNTER — Other Ambulatory Visit: Payer: Self-pay

## 2020-03-11 ENCOUNTER — Encounter: Payer: Self-pay | Admitting: Family Medicine

## 2020-03-11 VITALS — BP 126/82 | HR 63 | Ht 68.0 in | Wt 185.0 lb

## 2020-03-11 DIAGNOSIS — S63641D Sprain of metacarpophalangeal joint of right thumb, subsequent encounter: Secondary | ICD-10-CM

## 2020-03-11 DIAGNOSIS — G8929 Other chronic pain: Secondary | ICD-10-CM

## 2020-03-11 DIAGNOSIS — M79644 Pain in right finger(s): Secondary | ICD-10-CM | POA: Diagnosis not present

## 2020-03-11 NOTE — Patient Instructions (Signed)
[email or web address] Brace day and night for another 2 weeks, 3rd week do exercises See me in 3-4 weeks

## 2020-03-11 NOTE — Assessment & Plan Note (Addendum)
Patient fortunately does show some healing of the UCL noted.  Significant increase in Doppler flow in neovascularization in the area which should be helpful.  We discussed icing regimen and home exercises, we discussed continuing the brace for another 2 weeks. F/u 4 weeks

## 2020-04-08 ENCOUNTER — Other Ambulatory Visit: Payer: Self-pay

## 2020-04-08 ENCOUNTER — Ambulatory Visit (INDEPENDENT_AMBULATORY_CARE_PROVIDER_SITE_OTHER): Payer: No Typology Code available for payment source | Admitting: Family Medicine

## 2020-04-08 ENCOUNTER — Encounter: Payer: Self-pay | Admitting: Family Medicine

## 2020-04-08 ENCOUNTER — Ambulatory Visit (INDEPENDENT_AMBULATORY_CARE_PROVIDER_SITE_OTHER): Payer: No Typology Code available for payment source

## 2020-04-08 VITALS — BP 148/86 | HR 58 | Ht 68.0 in | Wt 189.0 lb

## 2020-04-08 DIAGNOSIS — M79644 Pain in right finger(s): Secondary | ICD-10-CM | POA: Diagnosis not present

## 2020-04-08 DIAGNOSIS — S63641D Sprain of metacarpophalangeal joint of right thumb, subsequent encounter: Secondary | ICD-10-CM | POA: Diagnosis not present

## 2020-04-08 NOTE — Assessment & Plan Note (Signed)
Patient is on ultrasound and physical exam still has some instability noted.  We discussed with patient at great length, discussed home exercises and icing regimen.  Discussed with her how to avoid surgical activities that could be contributing.  Patient will increase activity slowly and follow-up with me again in 4 to 8 weeks

## 2020-04-08 NOTE — Progress Notes (Signed)
[place] [address] [place] Phone: [phone] Subjective:   I Kandace Blitz am serving as a Education administrator for Dr. Hulan Saas.  This visit occurred during the SARS-CoV-2 public health emergency.  Safety protocols were in place, including screening questions prior to the visit, additional usage of staff PPE, and extensive cleaning of exam room while observing appropriate contact time as indicated for disinfecting solutions.   I'm seeing this patient by the request  of:  Marin Olp, MD  CC: Thumb pain follow-up  [id number]   03/11/2020 Patient fortunately does show some healing of the UCL noted.  Significant increase in Doppler flow in neovascularization in the area which should be helpful.  We discussed icing regimen and home exercises, we discussed continuing the brace for another 2 weeks. F/u 4 weeks   Update 04/08/2020 Patrick [name] is a 64 y.o. male coming in with complaint of right thumb pain. Patient states he is making progress.  Patient states that he is feeling another approximately 50% better than previous exam.  Patient has been wearing the brace fairly regularly.  Patient though does have a cough and does some range of motion exercises.  States that it does feel sore after that but overall no new symptoms.      Past Medical History:  Diagnosis Date  . Cancer [facility])    prostate CA- bx done 11-18, hasn't started treatment  . Coronary artery calcification seen on CAT scan    Coronary Calcium Score (November 13, 2018)- 158.  Mild Calcium in all 3 Epicardial coronary arteries.  Slight TA dilation ~4 cm.   . Hyperlipidemia    Rosuvastatin just ordered, but not yet started.  Marland Kitchen NEPHROLITHIASIS, HX OF 02/28/2008   Qualifier: Diagnosis of  By: [name] MD, [name]    Past Surgical History:  Procedure Laterality Date  . A/C repair right  years ago   HS football injury  . COLONOSCOPY    . KNEE ARTHROSCOPY  11/15/2012   meniscus repair- Dr. [name], right  . PROSTATE BIOPSY     November 2018   Social History   Socioeconomic History  . Marital status: Married    Spouse name: Not on file  . Number of children: Not on file  . Years of education: Not on file  . Highest education level: Not on file  Occupational History  . Occupation: [phone]    Employer: [employer]  . Occupation: MECHANICAL EQUIPMENT SALES  Tobacco Use  . Smoking status: Never Smoker  . Smokeless tobacco: Never Used  Substance and Sexual Activity  . Alcohol use: Yes    Comment: occasional  . Drug use: No  . Sexual activity: Not on file  Other Topics Concern  . Not on file  Social History Narrative   Married 1979. 2 kids. No grandkids.       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 Difficulty of Paying Living Expenses:   Food Insecurity:   . Worried About Charity fundraiser in the Last Year:   . Arboriculturist in the Last Year:   Transportation Needs:   . Film/video editor (Medical):   Marland Kitchen Lack of Transportation (Non-Medical):   Physical Activity:   . Days of Exercise per Week:   . Minutes of  Exercise per Session:   Stress:   . Feeling of Stress :   Social Connections:   . Frequency of Communication with Friends and Family:   . Frequency of Social Gatherings with Friends and Family:   . Attends Religious Services:   . Active Member of Clubs or Organizations:   . Attends Archivist Meetings:   Marland Kitchen Marital Status:    No Known Allergies Family History  Problem Relation Age of Onset  . Heart disease Father        71s heavy smoker  . Prostate cancer Father        69s or 88s  . Hyperlipidemia Father   . Diabetes Mother   . Cancer Mother 59       lung cancer-nonsmoker. 89 in 2020- toward end of life  . Heart  disease Brother   . Colon cancer Neg Hx   . Esophageal cancer Neg Hx   . Rectal cancer Neg Hx   . Stomach cancer Neg Hx      Current Outpatient Medications (Cardiovascular):  .  losartan (COZAAR) 25 MG tablet, TAKE 1 TABLET BY MOUTH EVERY DAY .  rosuvastatin (CRESTOR) 20 MG tablet, TAKE 1 TABLET BY MOUTH EVERY DAY   Current Outpatient Medications (Analgesics):  .  aspirin 81 MG tablet, Take 81 mg by mouth daily.    Current Outpatient Medications (Other):  .  cyclobenzaprine (FLEXERIL) 10 MG tablet, Take 1 tablet (10 mg total) by mouth 3 (three) times daily as needed for muscle spasms. .  traZODone (DESYREL) 50 MG tablet, TAKE 0.5-1 TABLETS (25-50 MG TOTAL) BY MOUTH AT BEDTIME AS NEEDED FOR SLEEP.   Reviewed prior external information including notes and imaging from  primary care provider As well as notes that were available from care everywhere and other healthcare systems.  Past medical history, social, surgical and family history all reviewed in electronic medical record.  No pertanent information unless stated regarding to the chief complaint.   Review of Systems:  No headache, visual changes, nausea, vomiting, diarrhea, constipation, dizziness, abdominal pain, skin rash, fevers, chills, night sweats, weight loss, swollen lymph nodes, body aches, joint swelling, chest pain, shortness of breath, mood changes. POSITIVE muscle aches  Objective  Blood pressure (!) 148/86, pulse (!) 58, height 5\' 8"  (1.727 m), weight 189 lb (85.7 kg), SpO2 95 %.   General: No apparent distress alert and oriented x3 mood and affect normal, dressed appropriately.  HEENT: Pupils equal, extraocular movements intact  Respiratory: Patient's speak in full sentences and does not appear short of breath  Cardiovascular: No lower extremity edema, non tender, no erythema  Neuro: Cranial nerves II through XII are intact, neurovascularly intact in all extremities with 2+ DTRs and 2+ pulses.  Gait normal with  good balance and coordination.  MSK:  Non tender with full range of motion and good stability and symmetric strength and tone of shoulders, elbows,  hip, knee and ankles bilaterally.  Right thumb shows the patient does still have some mild gapping when testing the UCL.  Tender to palpation to is moderate to severe.  Limited musculoskeletal ultrasound was performed and interpreted by [name]  Limited ultrasound shows the patient is still on dynamic testing shows a gapping of the UCL on the proximal aspect.  Increase in Doppler flow in neovascularization still noted.  No significant cortical irregularity though noted.   Impression and Recommendations:     T The above documentation has been reviewed and is accurate  and complete [name], DO       Note: This dictation was prepared with Dragon dictation along with smaller phrase technology. Any transcriptional errors that result from this process are unintentional.

## 2020-04-08 NOTE — Patient Instructions (Signed)
Good to see you No lifting more than 10 lbs out of the brace See me again in 3-4 weeks

## 2020-04-21 ENCOUNTER — Other Ambulatory Visit: Payer: Self-pay | Admitting: Cardiology

## 2020-04-23 MED ORDER — LOSARTAN POTASSIUM 25 MG PO TABS: 25.0000 mg | ORAL_TABLET | Freq: Every day | ORAL | 3 refills | Status: DC

## 2020-04-24 NOTE — Progress Notes (Signed)
GU Location of Tumor / Histology:  Adenocarcinoma of Prostate  If Prostate Cancer, Gleason Score is (3 + 4), PSA is (6.53-on 10/29/2019), and Prostate Volume ~61.6 mL  Patrick [name] has been under surveillance since 10/2017  Biopsies revealed:  02/13/2020   11/17/2017   Past/Anticipated interventions by urology, if any: 02/13/2020 and 11/17/2017 Dr. Kathie Rhodes Transrectal ultrasound and biopsy of prostate  Possible bilateral nerve-sparing robot assisted laparoscopic radical prostatectomy and pelvic lymphadenectomy.   Past/Anticipated interventions by medical oncology, if any:  None  Weight changes, if any: denies  Bowel/Bladder complaints, if any: IPSS 1. SHIM 15. Denies dysuria or hematuria. Denies urinary leakage or incontinence. Denies any bowel complaints.      Nausea/Vomiting, if any: denies  Pain issues, if any:  denies  SAFETY ISSUES:  Prior radiation? denies  Pacemaker/ICD? denies  Possible current pregnancy? N/A  Is the patient on methotrexate? denies  Current Complaints / other details:  64 year old male. Married. Father has a hx of prostate ca. Mother with a hx of lung ca. Both parents are deceased.

## 2020-04-25 ENCOUNTER — Telehealth: Payer: No Typology Code available for payment source | Admitting: Radiation Oncology

## 2020-04-25 ENCOUNTER — Ambulatory Visit
Admission: RE | Admit: 2020-04-25 | Discharge: 2020-04-25 | Disposition: A | Discharge status: Home / Self Care | Payer: No Typology Code available for payment source | Source: Ambulatory Visit | Attending: Radiation Oncology | Admitting: Radiation Oncology

## 2020-04-25 ENCOUNTER — Ambulatory Visit: Payer: No Typology Code available for payment source

## 2020-04-25 ENCOUNTER — Other Ambulatory Visit: Payer: Self-pay

## 2020-04-25 ENCOUNTER — Encounter: Payer: Self-pay | Admitting: Radiation Oncology

## 2020-04-25 ENCOUNTER — Telehealth: Payer: Self-pay | Admitting: Radiation Oncology

## 2020-04-25 VITALS — Ht 68.0 in | Wt 185.0 lb

## 2020-04-25 DIAGNOSIS — C61 Malignant neoplasm of prostate: Secondary | ICD-10-CM

## 2020-04-25 HISTORY — DX: Malignant neoplasm of prostate: C61

## 2020-04-25 NOTE — Progress Notes (Signed)
Radiation Oncology         [phone] ________________________________  Initial Outpatient Consultation - Conducted via MyChart due to current COVID-19 concerns for limiting patient exposure  Name: VINSON TIETZE MRN: [id number]  Date: 04/25/2020  DOB: 1956/05/04  OE:UMPNTI, Brayton Mars, MD  [name], MD   REFERRING PHYSICIAN: [name], MD  DIAGNOSIS: 64 y.o. gentleman with Stage T1c adenocarcinoma of the prostate with Gleason score of 3+4, and PSA of 6.5.    ICD-10-CM   1. Prostate cancer [facility])  C61     HISTORY OF PRESENT ILLNESS: THIMOTHY BARRETTA is a 64 y.o. male with a diagnosis of prostate cancer. He was initially referred to Dr. Karsten Ro for an elevated PSA of 5.41, as well as a right apical prostate nodule,  in 10/2017. He underwent a prostate biopsy which confirmed Gleason 3+3 in 4/12 cores with a prostate volume of 39 cc. He elected to proceed with active surveillance at that time and has remained in close follow up with stable PSA until 10/2019 as below:  PSA trend: 08/2018 - 5.23 10/2018 - 5.33 05/2019 - 5.23 10/2019 - 6.5  The rise in PSA prompted a prostate MRI which was performed on 01/02/2020 showing apical lesions in the right and left hemi-prostate without signs of extracapsular disease or lymphadenopathy.  Prostate volume was estimated at 37.5 cc.  The patient proceeded to MRI fusion biopsy of the prostate on 02/13/2020.  The prostate volume measured 61.64 cc.  Out of 18 core biopsies, 10 were positive.  The maximum Gleason score was 3+4, and this was seen in the left and right apex lateral cores. Additionally, Gleason 3+3 was seen in the right and left apex, and all three samples from each ROI MRI lesion.  He met with Dr. Alinda Money on 04/22/2020 to discuss prostatectomy and is currently leaning towards proceeding with RALP but interested in learning more about radiation treatment options prior to committing to treatement.  The patient reviewed the biopsy results  with his urologist and he has kindly been referred today for discussion of potential radiation treatment options.  PREVIOUS RADIATION THERAPY: No  PAST MEDICAL HISTORY:  Past Medical History:  Diagnosis Date  . Cancer [facility])    prostate CA- bx done 11-18, hasn't started treatment  . Coronary artery calcification seen on CAT scan    Coronary Calcium Score (November 13, 2018)- 158.  Mild Calcium in all 3 Epicardial coronary arteries.  Slight TA dilation ~4 cm.   . Hyperlipidemia    Rosuvastatin just ordered, but not yet started.  Marland Kitchen NEPHROLITHIASIS, HX OF 02/28/2008   Qualifier: Diagnosis of  By: [name] MD, [name] Prostate cancer [facility])       PAST SURGICAL HISTORY: Past Surgical History:  Procedure Laterality Date  . A/C repair right  years ago   HS football injury  . COLONOSCOPY    . KNEE ARTHROSCOPY  11/15/2012   meniscus repair- Dr. [name], right  . PROSTATE BIOPSY     November 2018    FAMILY HISTORY:  Family History  Problem Relation Age of Onset  . Heart disease Father        24s heavy smoker  . Prostate cancer Father        55s or 27s  . Hyperlipidemia Father   . Diabetes Mother   . Cancer Mother 74       lung cancer-nonsmoker. 89 in 2020- toward end of life  . Lung cancer Mother   .  Heart disease Brother   . Colon cancer Neg Hx   . Esophageal cancer Neg Hx   . Rectal cancer Neg Hx   . Stomach cancer Neg Hx   . Breast cancer Neg Hx     SOCIAL HISTORY:  Social History   Socioeconomic History  . Marital status: Married    Spouse name: Not on file  . Number of children: Not on file  . Years of education: Not on file  . Highest education level: Not on file  Occupational History  . Occupation: [phone]    Employer: [employer]  . Occupation: MECHANICAL EQUIPMENT SALES  Tobacco Use  . Smoking status: Never Smoker  . Smokeless tobacco: Never Used  Substance and Sexual Activity  . Alcohol use: Yes    Comment: occasional  . Drug use:  No  . Sexual activity: Yes  Other Topics Concern  . Not on file  Social History Narrative   Married 1979. 2 kids. No grandkids.       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 Difficulty of Paying Living Expenses:   Food Insecurity:   . Worried About Charity fundraiser in the Last Year:   . Arboriculturist in the Last Year:   Transportation Needs:   . Film/video editor (Medical):   Marland Kitchen Lack of Transportation (Non-Medical):   Physical Activity:   . Days of Exercise per Week:   . Minutes of Exercise per Session:   Stress:   . Feeling of Stress :   Social Connections:   . Frequency of Communication with Friends and Family:   . Frequency of Social Gatherings with Friends and Family:   . Attends Religious Services:   . Active Member of Clubs or Organizations:   . Attends Archivist Meetings:   Marland Kitchen Marital Status:   Intimate Partner Violence:   . Fear of Current or Ex-Partner:   . Emotionally Abused:   Marland Kitchen Physically Abused:   . Sexually Abused:     ALLERGIES: Patient has no known allergies.  MEDICATIONS:  Current Outpatient Medications  Medication Sig Dispense Refill  . aspirin 81 MG tablet Take 81 mg by mouth daily.     Marland Kitchen losartan (COZAAR) 25 MG tablet Take 1 tablet (25 mg total) by mouth daily. 90 tablet 3  . rosuvastatin (CRESTOR) 20 MG tablet TAKE 1 TABLET BY MOUTH EVERY DAY 135 tablet 1  . traZODone (DESYREL) 50 MG tablet TAKE 0.5-1 TABLETS (25-50 MG TOTAL) BY MOUTH AT BEDTIME AS NEEDED FOR SLEEP. 90 tablet 1  . cyclobenzaprine (FLEXERIL) 10 MG tablet Take 1 tablet (10 mg total) by mouth 3 (three) times daily as needed for muscle spasms. (Patient not taking: Reported on 04/25/2020) 30 tablet 0   No current facility-administered medications for this encounter.    REVIEW OF SYSTEMS:  On  review of systems, the patient reports that he is doing well overall. He denies any chest pain, shortness of breath, cough, fevers, chills, night sweats, unintended weight changes. He denies any bowel disturbances, and denies abdominal pain, nausea or vomiting. He denies any new musculoskeletal or joint aches or pains. His IPSS was 1, indicating minimal urinary symptoms. His SHIM was 15, indicating he has moderate erectile dysfunction. A complete review of systems is obtained and is otherwise negative.  PHYSICAL EXAM:  Wt Readings from Last 3 Encounters:  04/25/20 185 lb (83.9 kg)  04/08/20 189 lb (85.7 kg)  03/11/20 185 lb (83.9 kg)   Temp Readings from Last 3 Encounters:  11/01/19 98.6 F (37 C) (Temporal)  03/27/19 99 F (37.2 C) (Temporal)  10/27/18 98.4 F (36.9 C) (Oral)   BP Readings from Last 3 Encounters:  04/08/20 (!) 148/86  03/11/20 126/82  02/25/20 122/84   Pulse Readings from Last 3 Encounters:  04/08/20 (!) 58  03/11/20 63  02/25/20 (!) 57   Pain Assessment Pain Score: 0-No pain/10  In general this is a well appearing Caucasian gentleman in no acute distress. He's alert and oriented x4 and appropriate throughout the examination. Cardiopulmonary assessment is negative for acute distress and he exhibits normal effort.    KPS = 100  100 - Normal; no complaints; no evidence of disease. 90   - Able to carry on normal activity; minor signs or symptoms of disease. 80   - Normal activity with effort; some signs or symptoms of disease. 72   - Cares for self; unable to carry on normal activity or to do active work. 60   - Requires occasional assistance, but is able to care for most of his personal needs. 50   - Requires considerable assistance and frequent medical care. 52   - Disabled; requires special care and assistance. 40   - Severely disabled; hospital admission is indicated although death not imminent. 61   - Very sick; hospital admission necessary; active  supportive treatment necessary. 10   - Moribund; fatal processes progressing rapidly. 0     - Dead  Karnofsky DA, Abelmann [place], Craver LS and Burchenal [facility] [phone]) The use of the nitrogen mustards in the palliative treatment of carcinoma: with particular reference to bronchogenic carcinoma Cancer 1 634-56  LABORATORY DATA:  Lab Results  Component Value Date   WBC 4.7 10/29/2019   HGB 14.9 10/29/2019   HCT 43.3 10/29/2019   MCV 90.6 10/29/2019   PLT 199.0 10/29/2019   Lab Results  Component Value Date   NA 137 10/29/2019   K 4.3 10/29/2019   CL 102 10/29/2019   CO2 26 10/29/2019   Lab Results  Component Value Date   ALT 34 10/29/2019   AST 25 10/29/2019   ALKPHOS 54 10/29/2019   BILITOT 1.0 10/29/2019     RADIOGRAPHY: [place] LIMITED JOINT SPACE STRUCTURES UP RIGHT  Result Date: 04/28/2020 Limited musculoskeletal ultrasound was performed and interpreted by [name] Limited ultrasound shows the patient is still on dynamic testing shows a gapping of the UCL on the proximal aspect.  Increase in Doppler flow in neovascularization still noted.  No significant cortical irregularity though noted.     IMPRESSION/PLAN: This visit was conducted via MyChart to spare the patient unnecessary potential exposure in the healthcare setting during the current COVID-19 pandemic. 1. 64 y.o. gentleman with Stage T1c adenocarcinoma of the prostate with Gleason Score of 3+4, and PSA of 6.5. We discussed the patient's workup and outlined the nature of prostate cancer in this setting. The patient's T stage, Gleason's score, and PSA put him into the favorable intermediate risk group. Accordingly, he is eligible for a variety of potential treatment options including brachytherapy, 5.5 weeks of external radiation, or prostatectomy. We discussed the available radiation techniques, and focused on the details and logistics of delivery. He felt to be a marginal candidate for brachytherapy given the prostate  volume, prior to downsizing  with 5-ARI for a minimum of 2-3 months. We discussed and outlined the risks, benefits, short and long-term effects associated with radiotherapy and compared and contrasted these with prostatectomy. We discussed the role of SpaceOAR in reducing the rectal toxicity associated with radiotherapy.  Today I reviewed the findings and workup thus far.  We discussed the natural history of prostate cancer.  We reviewed the the implications of T-stage, Gleason's Score, and PSA on decision-making and outcomes related to prostate cancer.  We discussed radiation treatment in the management of prostate cancer with regard to the logistics and delivery of external beam radiation treatment as well as the logistics and delivery of prostate brachytherapy.  We compared and contrasted each of these approaches and also compared these against prostatectomy.    The patient focused most of his questions and interest in robotic-assisted laparoscopic radical prostatectomy.  We discussed some of the potential advantages of surgery including surgical staging, the availability of salvage radiotherapy to the prostatic fossa, and the confidence associated with immediate biochemical response.  We discussed some of the potential proven indications for postoperative radiotherapy including positive margins, extracapsular extension, and seminal vesicle involvement. We also talked about some of the other potential findings leading to a recommendation for radiotherapy including a non-zero postoperative PSA and positive lymph nodes.    At the end of the conversation, the patient appears to still be leaning towards prostatectomy but will take some additional time to consider his options. He knows to contact us if he has any additional questions or would like to pursue radiation therapy but otherwise, will follow up with Dr. Alinda Money to proceed with scheduling surgery accordingly. We enjoyed meeting him today and look forward  to following his progress.   Given current concerns for patient exposure during the COVID-19 pandemic, this encounter was conducted via video-enabled MyChart visit. The patient has given verbal consent for this type of encounter. The time spent during this encounter was 75 minutes. The attendants for this meeting include Tyler Pita MD, Ashlyn Bruning PA-C, Katie Daubenspeck- scribe, and patient, CHERYL STABENOW and his wife. During the encounter, Tyler Pita MD, Ashlyn Bruning PA-C, and scribe, Wilburn Mylar were located at [place].  Patient, SILVER PARKEY and his wife were located at home.    Nicholos Johns, PA-C    Tyler Pita, MD  [facility] Direct Dial: [phone]  Fax: [phone] [email or web address]  Skype  [email or web address]  This document serves as a record of services personally performed by Tyler Pita, MD and Freeman Caldron, PA-C. It was created on their behalf by Wilburn Mylar, a trained medical scribe. The creation of this record is based on the scribe's personal observations and the provider's statements to them. This document has been checked and approved by the attending provider.

## 2020-05-01 ENCOUNTER — Other Ambulatory Visit: Payer: Self-pay | Admitting: Urology

## 2020-05-05 ENCOUNTER — Encounter: Payer: Self-pay | Admitting: Medical Oncology

## 2020-05-05 NOTE — Progress Notes (Signed)
Spoke with patient to introduce myself as the prostate nurse navigator and discuss my role. I was unable to  meet him 5/7, when he consulted with Dr. Tammi Klippel.He states the consult went well and he has given his treatment options thought and has chosen robotic prostatectomy. He is scheduled for surgery 8/2, with Dr. Alinda Money. I wished him well and asked him to reach out, if I can assist him in anyway in the future. He was very appreciative of my call and voiced his appreciation for the care his mother received at [facility].

## 2020-05-07 ENCOUNTER — Encounter: Payer: Self-pay | Admitting: Family Medicine

## 2020-05-07 ENCOUNTER — Other Ambulatory Visit: Payer: Self-pay

## 2020-05-07 ENCOUNTER — Ambulatory Visit (INDEPENDENT_AMBULATORY_CARE_PROVIDER_SITE_OTHER): Payer: No Typology Code available for payment source | Admitting: Family Medicine

## 2020-05-07 DIAGNOSIS — S63641D Sprain of metacarpophalangeal joint of right thumb, subsequent encounter: Secondary | ICD-10-CM | POA: Diagnosis not present

## 2020-05-07 NOTE — Patient Instructions (Signed)
Looks good Brace with heavy lifting See me again in 7 weeks if not perfect

## 2020-05-07 NOTE — Assessment & Plan Note (Signed)
Patient has done remarkably well for conservative therapy with a full rupture of the UCL.  Patient does have scarring noted today which is an improvement.  Patient should do relatively well hopefully.  Patient will wear the brace only when he is doing significant lifting.  Follow-up again in 4 to 8 weeks

## 2020-05-07 NOTE — Progress Notes (Signed)
[facility] [address] [place] Phone: [phone] Subjective:   I Patrick [name] am serving as a Education administrator for Dr. Hulan Saas.  This visit occurred during the SARS-CoV-2 public health emergency.  Safety protocols were in place, including screening questions prior to the visit, additional usage of staff PPE, and extensive cleaning of exam room while observing appropriate contact time as indicated for disinfecting solutions.   I'm seeing this patient by the request  of:  Marin Olp, MD  CC: Right thumb pain follow-up  [id number]   04/08/2020 Patient is on ultrasound and physical exam still has some instability noted.  We discussed with patient at great length, discussed home exercises and icing regimen.  Discussed with her how to avoid surgical activities that could be contributing.  Patient will increase activity slowly and follow-up with me again in 4 to 8 weeks  Update 05/17/2020 Patrick [name] is a 64 y.o. male coming in with complaint of right thumb pain. Patient states he is doing a lot better.  Patient states has been not wearing the brace for some days now.  Making progress.  Has noticed that it is not as severe as what it was.  Still very aware of it though.      Past Medical History:  Diagnosis Date  . Cancer [facility])    prostate CA- bx done 11-18, hasn't started treatment  . Coronary artery calcification seen on CAT scan    Coronary Calcium Score (November 13, 2018)- 158.  Mild Calcium in all 3 Epicardial coronary arteries.  Slight TA dilation ~4 cm.   . Hyperlipidemia    Rosuvastatin just ordered, but not yet started.  Marland Kitchen NEPHROLITHIASIS, HX OF 02/28/2008   Qualifier: Diagnosis of  By: [name] MD, [name] Prostate cancer [facility])    Past Surgical History:  Procedure Laterality Date  . A/C repair right  years ago   HS football injury  . COLONOSCOPY    . KNEE ARTHROSCOPY  11/15/2012   meniscus repair- Dr. [name],  right  . PROSTATE BIOPSY     November 2018   Social History   Socioeconomic History  . Marital status: Married    Spouse name: Not on file  . Number of children: Not on file  . Years of education: Not on file  . Highest education level: Not on file  Occupational History  . Occupation: [phone]    Employer: [employer]  . Occupation: MECHANICAL EQUIPMENT SALES  Tobacco Use  . Smoking status: Never Smoker  . Smokeless tobacco: Never Used  Substance and Sexual Activity  . Alcohol use: Yes    Comment: occasional  . Drug use: No  . Sexual activity: Yes  Other Topics Concern  . Not on file  Social History Narrative   Married 1979. 2 kids. No grandkids.       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 Difficulty of Paying Living Expenses:   Food Insecurity:   . Worried About Charity fundraiser in the Last Year:   . Arboriculturist in the Last Year:   Transportation Needs:   . Film/video editor (Medical):   Marland Kitchen Lack of Transportation (Non-Medical):   Physical Activity:   . Days of Exercise per Week:   .  Minutes of Exercise per Session:   Stress:   . Feeling of Stress :   Social Connections:   . Frequency of Communication with Friends and Family:   . Frequency of Social Gatherings with Friends and Family:   . Attends Religious Services:   . Active Member of Clubs or Organizations:   . Attends Archivist Meetings:   Marland Kitchen Marital Status:    No Known Allergies Family History  Problem Relation Age of Onset  . Heart disease Father        18s heavy smoker  . Prostate cancer Father        20s or 70s  . Hyperlipidemia Father   . Diabetes Mother   . Cancer Mother 33       lung cancer-nonsmoker. 89 in 2020- toward end of life  . Lung cancer Mother   . Heart disease Brother     . Colon cancer Neg Hx   . Esophageal cancer Neg Hx   . Rectal cancer Neg Hx   . Stomach cancer Neg Hx   . Breast cancer Neg Hx      Current Outpatient Medications (Cardiovascular):  .  losartan (COZAAR) 25 MG tablet, Take 1 tablet (25 mg total) by mouth daily. .  rosuvastatin (CRESTOR) 20 MG tablet, TAKE 1 TABLET BY MOUTH EVERY DAY   Current Outpatient Medications (Analgesics):  .  aspirin 81 MG tablet, Take 81 mg by mouth daily.    Current Outpatient Medications (Other):  .  cyclobenzaprine (FLEXERIL) 10 MG tablet, Take 1 tablet (10 mg total) by mouth 3 (three) times daily as needed for muscle spasms. .  traZODone (DESYREL) 50 MG tablet, TAKE 0.5-1 TABLETS (25-50 MG TOTAL) BY MOUTH AT BEDTIME AS NEEDED FOR SLEEP.   Reviewed prior external information including notes and imaging from  primary care provider As well as notes that were available from care everywhere and other healthcare systems.  Past medical history, social, surgical and family history all reviewed in electronic medical record.  No pertanent information unless stated regarding to the chief complaint.   Review of Systems:  No headache, visual changes, nausea, vomiting, diarrhea, constipation, dizziness, abdominal pain, skin rash, fevers, chills, night sweats, weight loss, swollen lymph nodes, body aches, joint swelling, chest pain, shortness of breath, mood changes. POSITIVE muscle aches  Objective  Blood pressure 134/80, pulse 62, height 5\' 8"  (1.727 m), weight 184 lb (83.5 kg), peak flow 97 L/min.   General: No apparent distress alert and oriented x3 mood and affect normal, dressed appropriately.  HEENT: Pupils equal, extraocular movements intact  Respiratory: Patient's speak in full sentences and does not appear short of breath  Cardiovascular: No lower extremity edema, non tender, no erythema  Neuro: Cranial nerves II through XII are intact Right hand exam shows the patient does have some mild arthritic  changes of the [facility] joint.  Negative grind test though.  Patient's UCL does have a endpoint at the moment.  Some audible popping noted.  Still tender to palpation in the area.     Impression and Recommendations:      The above documentation has been reviewed and is accurate and complete [name], DO       Note: This dictation was prepared with Dragon dictation along with smaller phrase technology. Any transcriptional errors that result from this process are unintentional.

## 2020-06-21 ENCOUNTER — Encounter: Payer: Self-pay | Admitting: Family Medicine

## 2020-07-01 ENCOUNTER — Encounter: Payer: Self-pay | Admitting: Family Medicine

## 2020-07-01 ENCOUNTER — Ambulatory Visit: Payer: No Typology Code available for payment source | Admitting: Family Medicine

## 2020-07-14 ENCOUNTER — Other Ambulatory Visit: Payer: Self-pay | Admitting: Family Medicine

## 2020-07-15 NOTE — Patient Instructions (Addendum)
DUE TO COVID-19 ONLY ONE VISITOR IS ALLOWED TO COME WITH YOU AND STAY IN THE WAITING ROOM ONLY DURING PRE OP AND PROCEDURE DAY OF SURGERY. THE 1 VISITOR MAY VISIT WITH YOU AFTER SURGERY IN YOUR PRIVATE ROOM DURING VISITING HOURS ONLY!  YOU NEED TO HAVE A COVID 19 TEST ON: 07/24/20 @ 10:00 am , THIS TEST MUST BE DONE BEFORE SURGERY, COME TO COVID TESTING SITE [address] [place] [address], IT IS ON THE RIGHT GOING OUT [address] APPROXITAMELTELY 2 MINUTES PAST [employer] ON THE RIGHT. ONCE YOUR COVID TEST IS COMPLETED,  PLEASE BEGIN THE QUARANTINE INSTRUCTIONS AS OUTLINED IN YOUR HANDOUT.                Patrick [name]    Your procedure is scheduled on: 07/28/20   Report to [facility] Main  Entrance   Report to short stay at : 5:30 AM     Call this number if you have problems the morning of surgery [phone]    Remember: Do not eat food or drink liquids :After Midnight.   BRUSH YOUR TEETH MORNING OF SURGERY AND RINSE YOUR MOUTH OUT, NO CHEWING GUM CANDY OR MINTS.                                 You may not have any metal on your body including hair pins and              piercings  Do not wear jewelry, lotions, powders or perfumes, deodorant             Men may shave face and neck.   Do not bring valuables to the hospital. [place].  Contacts, dentures or bridgework may not be worn into surgery.  Leave suitcase in the car. After surgery it may be brought to your room.     Patients discharged the day of surgery will not be allowed to drive home. IF YOU ARE HAVING SURGERY AND GOING HOME THE SAME DAY, YOU MUST HAVE AN ADULT TO DRIVE YOU HOME AND BE WITH YOU FOR 24 HOURS. YOU MAY GO HOME BY TAXI OR UBER OR ORTHERWISE, BUT AN ADULT MUST ACCOMPANY YOU HOME AND STAY WITH YOU FOR 24 HOURS.  Name and phone number of your driver:  Special Instructions: N/A              Please read over the following fact  sheets you were given: ____________________________________________________________________          [facility] - Preparing for Surgery Before surgery, you can play an important role.  Because skin is not sterile, your skin needs to be as free of germs as possible.  You can reduce the number of germs on your skin by washing with CHG (chlorahexidine gluconate) soap before surgery.  CHG is an antiseptic cleaner which kills germs and bonds with the skin to continue killing germs even after washing. Please DO NOT use if you have an allergy to CHG or antibacterial soaps.  If your skin becomes reddened/irritated stop using the CHG and inform your nurse when you arrive at Short Stay. Do not shave (including legs and underarms) for at least 48 hours prior to the first CHG shower.  You may shave your face/neck. Please follow these instructions  carefully:  1.  Shower with CHG Soap the night before surgery and the  morning of Surgery.  2.  If you choose to wash your hair, wash your hair first as usual with your  normal  shampoo.  3.  After you shampoo, rinse your hair and body thoroughly to remove the  shampoo.                           4.  Use CHG as you would any other liquid soap.  You can apply chg directly  to the skin and wash                       Gently with a scrungie or clean washcloth.  5.  Apply the CHG Soap to your body ONLY FROM THE NECK DOWN.   Do not use on face/ open                           Wound or open sores. Avoid contact with eyes, ears mouth and genitals (private parts).                       Wash face,  Genitals (private parts) with your normal soap.             6.  Wash thoroughly, paying special attention to the area where your surgery  will be performed.  7.  Thoroughly rinse your body with warm water from the neck down.  8.  DO NOT shower/wash with your normal soap after using and rinsing off  the CHG Soap.                9.  Pat yourself dry with a clean towel.            10.   Wear clean pajamas.            11.  Place clean sheets on your bed the night of your first shower and do not  sleep with pets. Day of Surgery : Do not apply any lotions/deodorants the morning of surgery.  Please wear clean clothes to the hospital/surgery center.  FAILURE TO FOLLOW THESE INSTRUCTIONS MAY RESULT IN THE CANCELLATION OF YOUR SURGERY PATIENT SIGNATURE_________________________________  NURSE SIGNATURE__________________________________  ________________________________________________________________________   Patrick [name]  An incentive spirometer is a tool that can help keep your lungs clear and active. This tool measures how well you are filling your lungs with each breath. Taking long deep breaths may help reverse or decrease the chance of developing breathing (pulmonary) problems (especially infection) following:  A long period of time when you are unable to move or be active. BEFORE THE PROCEDURE   If the spirometer includes an indicator to show your best effort, your nurse or respiratory therapist will set it to a desired goal.  If possible, sit up straight or lean slightly forward. Try not to slouch.  Hold the incentive spirometer in an upright position. INSTRUCTIONS FOR USE  1. Sit on the edge of your bed if possible, or sit up as far as you can in bed or on a chair. 2. Hold the incentive spirometer in an upright position. 3. Breathe out normally. 4. Place the mouthpiece in your mouth and seal your lips tightly around it. 5. Breathe in slowly and as deeply as possible, raising the piston or the ball toward the top of  the column. 6. Hold your breath for 3-5 seconds or for as long as possible. Allow the piston or ball to fall to the bottom of the column. 7. Remove the mouthpiece from your mouth and breathe out normally. 8. Rest for a few seconds and repeat Steps 1 through 7 at least 10 times every 1-2 hours when you are awake. Take your time and take a few  normal breaths between deep breaths. 9. The spirometer may include an indicator to show your best effort. Use the indicator as a goal to work toward during each repetition. 10. After each set of 10 deep breaths, practice coughing to be sure your lungs are clear. If you have an incision (the cut made at the time of surgery), support your incision when coughing by placing a pillow or rolled up towels firmly against it. Once you are able to get out of bed, walk around indoors and cough well. You may stop using the incentive spirometer when instructed by your caregiver.  RISKS AND COMPLICATIONS  Take your time so you do not get dizzy or light-headed.  If you are in pain, you may need to take or ask for pain medication before doing incentive spirometry. It is harder to take a deep breath if you are having pain. AFTER USE  Rest and breathe slowly and easily.  It can be helpful to keep track of a log of your progress. Your caregiver can provide you with a simple table to help with this. If you are using the spirometer at home, follow these instructions: [place] IF:   You are having difficultly using the spirometer.  You have trouble using the spirometer as often as instructed.  Your pain medication is not giving enough relief while using the spirometer.  You develop fever of 100.5 F (38.1 C) or higher. SEEK IMMEDIATE MEDICAL CARE IF:   You cough up bloody sputum that had not been present before.  You develop fever of 102 F (38.9 C) or greater.  You develop worsening pain at or near the incision site. MAKE SURE YOU:   Understand these instructions.  Will watch your condition.  Will get help right away if you are not doing well or get worse. Document Released: 04/18/2007 Document Revised: 02/28/2012 Document Reviewed: 06/19/2007 [facility] Patient Information 2014 [place], [place].   ________________________________________________________________________

## 2020-07-16 ENCOUNTER — Other Ambulatory Visit: Payer: Self-pay

## 2020-07-16 ENCOUNTER — Encounter (HOSPITAL_COMMUNITY): Payer: Self-pay

## 2020-07-16 ENCOUNTER — Encounter (HOSPITAL_COMMUNITY)
Admission: RE | Admit: 2020-07-16 | Discharge: 2020-07-16 | Disposition: A | Discharge status: Home / Self Care | Payer: No Typology Code available for payment source | Source: Ambulatory Visit | Attending: Urology | Admitting: Urology

## 2020-07-16 DIAGNOSIS — Z01812 Encounter for preprocedural laboratory examination: Secondary | ICD-10-CM | POA: Insufficient documentation

## 2020-07-16 HISTORY — DX: Essential (primary) hypertension: I10

## 2020-07-16 LAB — CBC
HCT: 45.3 % (ref 39.0–52.0)
Hemoglobin: 15.1 g/dL (ref 13.0–17.0)
MCH: 30.5 pg (ref 26.0–34.0)
MCHC: 33.3 g/dL (ref 30.0–36.0)
MCV: 91.5 fL (ref 80.0–100.0)
Platelets: 198 10*3/uL (ref 150–400)
RBC: 4.95 MIL/uL (ref 4.22–5.81)
RDW: 12.7 % (ref 11.5–15.5)
WBC: 6.1 10*3/uL (ref 4.0–10.5)
nRBC: 0 % (ref 0.0–0.2)

## 2020-07-16 LAB — BASIC METABOLIC PANEL
Anion gap: 13 (ref 5–15)
BUN: 15 mg/dL (ref 8–23)
CO2: 24 mmol/L (ref 22–32)
Calcium: 9.4 mg/dL (ref 8.9–10.3)
Chloride: 102 mmol/L (ref 98–111)
Creatinine, Ser: 0.66 mg/dL (ref 0.61–1.24)
GFR calc Af Amer: >60 mL/min (ref >60)
GFR calc non Af Amer: >60 mL/min (ref >60)
Glucose, Bld: 138 mg/dL — ABNORMAL HIGH (ref 70–99)
Potassium: 4.3 mmol/L (ref 3.5–5.1)
Sodium: 139 mmol/L (ref 135–145)

## 2020-07-16 LAB — TYPE AND SCREEN
ABO/RH(D): AB POS
Antibody Screen: NEGATIVE

## 2020-07-16 NOTE — Progress Notes (Addendum)
COVID Vaccine Completed:yes Date COVID Vaccine completed:03/2020 COVID vaccine manufacturer: *Pfizer    [employer]   PCP - Dr. Garret Reddish. LOV: 07/01/20. Cardiologist - Dr. Glenetta Hew. LOV: 02/20/20  Chest x-ray -  EKG - 02/20/20. EPIC Stress Test -  ECHO -  Cardiac Cath -   Sleep Study -  CPAP -   Fasting Blood Sugar -  Checks Blood Sugar _____ times a day  Blood Thinner Instructions: Aspirin Instructions: Last Dose:  Anesthesia review: Hx: HTN,coronary artery calcifications. Pt. Walks 3 miles almost every day.  Patient denies shortness of breath, fever, cough and chest pain at PAT appointment   Patient verbalized understanding of instructions that were given to them at the PAT appointment. Patient was also instructed that they will need to review over the PAT instructions again at home before surgery.

## 2020-07-17 NOTE — Progress Notes (Signed)
Anesthesia Chart Review   Case: [id number] Date/Time: 07/28/20 0645   Procedures:      XI ROBOTIC ASSISTED LAPAROSCOPIC RADICAL PROSTATECTOMY LEVEL 2 (N/A )     LYMPHADENECTOMY, PELVIC (Bilateral )   Anesthesia type: General   Pre-op diagnosis: PROSTATE CANCER   Location: [place] [facility] / [facility] ORS   Surgeons: [name], MD      DISCUSSION:64 y.o. never smoker with h/o HLD, HTN, bradycardia, prostate cancer scheduled for above procedure 07/28/2020 with Dr. [name].   Pt last seen by cardiology 02/27/2020.  Per OV note, "Low to intermediate risk score coronary calcium findings.  No evidence of any anginal symptoms.  He does have risk factors which we are treating accordingly with blood pressure control and statin.  No clear indication for aspirin, however probably warranted.  He is on ARB and statin.  No beta-blocker because of baseline bradycardia."  Anticipate pt can proceed with planned procedure barring acute status change.   VS: BP (!) 138/77   Pulse 78   Temp 36.7 C (Oral)   Resp 18   Ht 5\' 8"  (1.727 m)   Wt 83.1 kg   SpO2 99%   BMI 27.85 kg/m   PROVIDERS: Marin Olp, MD is PCP   Glenetta Hew, MD is Cardiologist  LABS: Labs reviewed: Acceptable for surgery. (all labs ordered are listed, but only abnormal results are displayed)  Labs Reviewed  BASIC METABOLIC PANEL - Abnormal; Notable for the following components:      Result Value   Glucose, Bld 138 (*)    All other components within normal limits  CBC  TYPE AND SCREEN     IMAGES:   EKG: 02/20/2020 56 bpm  Sinus brady  CV:  Past Medical History:  Diagnosis Date  . Cancer [facility])    prostate CA- bx done 11-18, hasn't started treatment  . Coronary artery calcification seen on CAT scan    Coronary Calcium Score (November 13, 2018)- 158.  Mild Calcium in all 3 Epicardial coronary arteries.  Slight TA dilation ~4 cm.   . Hyperlipidemia    Rosuvastatin just ordered, but not yet started.  .  Hypertension   . 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 A/C repair right  years ago   [employer] football injury  . COLONOSCOPY    . KNEE ARTHROSCOPY  11/15/2012   meniscus repair- Dr. [name], right  . PROSTATE BIOPSY     November 2018    MEDICATIONS: . aspirin 81 MG tablet  . cyclobenzaprine (FLEXERIL) 10 MG tablet  . losartan (COZAAR) 25 MG tablet  . rosuvastatin (CRESTOR) 20 MG tablet  . traZODone (DESYREL) 50 MG tablet   No current facility-administered medications for this encounter.   Konrad Felix, PA-C [facility] Pre-Surgical Testing [phone]

## 2020-07-24 ENCOUNTER — Other Ambulatory Visit (HOSPITAL_COMMUNITY): Payer: No Typology Code available for payment source

## 2020-07-24 ENCOUNTER — Other Ambulatory Visit (HOSPITAL_COMMUNITY)
Admission: RE | Admit: 2020-07-24 | Discharge: 2020-07-24 | Disposition: A | Discharge status: Home / Self Care | Payer: No Typology Code available for payment source | Source: Ambulatory Visit | Attending: Urology | Admitting: Urology

## 2020-07-24 DIAGNOSIS — Z01812 Encounter for preprocedural laboratory examination: Secondary | ICD-10-CM | POA: Diagnosis not present

## 2020-07-24 DIAGNOSIS — Z20822 Contact with and (suspected) exposure to covid-19: Secondary | ICD-10-CM | POA: Insufficient documentation

## 2020-07-24 LAB — SARS CORONAVIRUS 2 (TAT 6-24 HRS): SARS Coronavirus 2: NEGATIVE

## 2020-07-25 NOTE — H&P (Signed)
Office Visit Report     07/01/2020   --------------------------------------------------------------------------------   Patrick [name]  MRN: [id number]  DOB: 14-Jun-1956, 64 year old Male  SSN: [id number]   PRIMARY CARE:  Brayton Mars. Melanee Spry, MD  REFERRING:  Brayton Mars. Melanee Spry, MD  PROVIDER:  Kathie Rhodes, M.D.  TREATING:  [name], M.D.  LOCATION:  [facility] [phone]     --------------------------------------------------------------------------------   CC/HPI: CC: Prostate Cancer    Mr. Herren is a 64 year old who was noted to have an elevated PSA of 5.41 that led to a TRUS biopsy of the prostate in November 2018 that demonstrated low risk prostate cancer with 4/12 cores positive for Gleason 3+3=6 adenocarcinoma. He elected active surveillance management. An MRI was performed on 01/02/20 that indicated a right apical PI-RADS 4 lesion and a left apical PI-RADS 3 lesion. An MR/US fusion biopsy on 02/13/20 demonstrated 10 out of 18 cores positive for malignancy including 6 out of 6 targeted cores and 4 out of 12 biopsy cores positive of the systematic biopsies. Two cores of Gleason 3+4=7 disease was noted indicating upgraded disease. He presents today after electing to proceed with surgical treatment of his prostate cancer.   Family history: None.   Imaging studies:  MRI (01/02/20): No EPE, SVI, LAD, or bony metastatic lesions.   PMH: He has a history of hyperlipidemia and hypertension.  PSH: No abdominal surgeries.   TNM stage: cT1c N0 Mx  PSA: 6.53  Gleason score: 3+4=7 (Grade group 2)  Biopsy (02/13/20): 10/18 cores positive  Left: L lateral apex (40%, 3+4=7), L apex (40%, 3+3=6)  Right: R apex (10%, 3+3=6), R lateral apex (10%, 3+4=7)  Targeted: 6/6 cores positive, Gleason 3+3=6  Prostate volume: 61.6 cc   Nomogram  OC disease: 51%  EPE: 48%  SVI: 4%  LNI: 5%  PFS (5 year, 10 year): 83%, 72%   Urinary function: IPSS is 9.  Erectile  function: SHIM score is 20. He does not require medication.     ALLERGIES: No Allergies    MEDICATIONS: Blood Pressure  ROSUVASTATIN CALCIUM PO PRN  TRAZODONE HCL PO Daily     GU PSH: Prostate Needle Biopsy - 02/13/2020, 2018       PSH Notes: Knee Surgery Right   NON-GU PSH: Surgical Pathology, Gross And Microscopic Examination For Prostate Needle - 02/13/2020, 2018     GU PMH: Stress Incontinence - 06/27/2020 Prostate Cancer, After a long and thorough discussion about all of the options for treatment, a discussion of how these are undertaken and the anticipated postoperative course. The patient is undecided as to how he would like to proceed. He is going to give this further thought and indicated he will contact me with his final decision. - 02/20/2020, - 02/13/2020 (Stable), - 12/05/2019 (Stable), Is exam remains unchanged and his PSA remains stable. He is going to consider possibly undergoing treatment. He understands he does have some time to consider this and we will take this up again when he returns in 6 months., - 05/29/2019, His prostate exam is unchanged with a PSA that remains stable at 5.33. We have both agreed that continued active surveillance is the best way to proceed and he will therefore return for repeat DRE and PSA again in 6 months., - 11/22/2018 (Stable), We again discussed the options today. He was not sure whether he would want to consider possibly proceeding with surgery but he has a very indolent cancer and  after discussing this he has elected to proceed with continued active surveillance. I will continue to monitor his PSA closely with a recheck in 3 months and then DRE and PSA in 6 months, - 2019 (Stable), At this time he has elected to continue with observation while he does his due diligence. He said he has ordered several books about prostate cancer and what we have decided to do is recheck his PSA again in 3 months with a tentative plan to check another PSA and DRE in 6  months. He said he is a little bit concerned with radioactive seed implant and said that if he did decide treatment he would likely want to have surgery., - 2019, He is going to give all the options we discussed further consideration and then will be scheduled for a follow-up appointment so that we can discuss any further questions he has and decide on a treatment plan., - 2018 Weak Urinary Stream (Worsening) - 12/05/2019 Prostate nodule w/o LUTS (Stable), His prostate is slightly enlarged but it is unchanged by exam with a right apical nodule again noted. - 2019 Urinary Hesitancy, He has some hesitancy and slowing of his stream but he said it is not significant enough that he would want to consider any form of pharmacologic therapy at this time. We did discuss the use of alpha blockade therapy for these symptoms should they worsen. - 2018 BPH w/LUTS, Enlarged prostate with lower urinary tract symptoms (LUTS) - 2017      PMH Notes: History of calculus disease: He had a kidney stone when he was 64 years old but has not had any difficulty with stones since.     NON-GU PMH: Muscle weakness (generalized) - 06/27/2020, - 05/09/2020 Other muscle spasm - 06/27/2020 Personal history of other diseases of the circulatory system, History of hypertension - 2016 Personal history of other endocrine, nutritional and metabolic disease, History of hyperlipidemia - 2016    FAMILY HISTORY: cardiac disorder - Runs In Family Diabetes - Runs In Family hyperlipidemia - Runs In Family Lung Cancer - Runs In Family Prostate Cancer - Runs In Family   SOCIAL HISTORY: Marital Status: Married Preferred Language: English; Ethnicity: Not Hispanic Or Latino; Race: White Current Smoking Status: Patient has never smoked.  Social Drinker.  Drinks 3 caffeinated drinks per day.     Notes: Married, Never a smoker, Alcohol use   REVIEW OF SYSTEMS:    GU Review Male:   Patient denies frequent urination, hard to postpone urination,  burning/ pain with urination, get up at night to urinate, leakage of urine, stream starts and stops, trouble starting your streams, and have to strain to urinate .  Gastrointestinal (Lower):   Patient denies diarrhea and constipation.  Gastrointestinal (Upper):   Patient denies nausea and vomiting.  Constitutional:   Patient denies fever, night sweats, weight loss, and fatigue.  Skin:   Patient denies skin rash/ lesion and itching.  Eyes:   Patient denies blurred vision and double vision.  Ears/ Nose/ Throat:   Patient denies sore throat and sinus problems.  Hematologic/Lymphatic:   Patient denies swollen glands and easy bruising.  Cardiovascular:   Patient denies leg swelling and chest pains.  Respiratory:   Patient denies cough and shortness of breath.  Endocrine:   Patient denies excessive thirst.  Musculoskeletal:   Patient denies back pain and joint pain.  Neurological:   Patient denies headaches and dizziness.  Psychologic:   Patient denies depression and anxiety.   VITAL SIGNS:  07/01/2020 10:56 AM  Weight 180 lb / 81.65 kg  Height 68 in / 172.72 cm  BP 139/90 mmHg  Pulse 66 /min  Temperature 98.2 F / 36.7 C  BMI 27.4 kg/m   GU PHYSICAL EXAMINATION:    Prostate: Prostate about 40 grams. He has a small confined nodule to the right apex.   MULTI-SYSTEM PHYSICAL EXAMINATION:    Constitutional: Well-nourished. No physical deformities. Normally developed. Good grooming.  Neck: Neck symmetrical, not swollen. Normal tracheal position.  Respiratory: No labored breathing, no use of accessory muscles. Clear bilaterally.  Cardiovascular: Normal temperature, normal extremity pulses, no swelling, no varicosities. Regular rate and rhythm.  Lymphatic: No enlargement of neck, axillae, groin.  Skin: No paleness, no jaundice, no cyanosis. No lesion, no ulcer, no rash.  Neurologic / Psychiatric: Oriented to time, oriented to place, oriented to person. No depression, no anxiety, no  agitation.  Gastrointestinal: No mass, no tenderness, no rigidity, non obese abdomen.  Eyes: Normal conjunctivae. Normal eyelids.  Ears, Nose, Mouth, and Throat: Left ear no scars, no lesions, no masses. Right ear no scars, no lesions, no masses. Nose no scars, no lesions, no masses. Normal hearing. Normal lips.  Musculoskeletal: Normal gait and station of head and neck.     Complexity of Data:  Lab Test Review:   PSA  Records Review:   Pathology Reports, Previous Patient Records  X-Ray Review: MRI Prostate GSORAD: Reviewed Films.     05/23/20 10/29/19 05/23/19 11/13/18 08/22/18 05/12/18 02/07/18 10/25/17  PSA  Total PSA 7.14 ng/mL 6.53 ng/dl 5.23 ng/mL 5.33 ng/mL 5.23 ng/mL 4.58 ng/mL 5.00 ng/mL 5.41 ng/mL  Free PSA        0.47 ng/mL  % Free PSA        9 % PSA    PROCEDURES:          Urinalysis Dipstick Dipstick Cont'd  Color: Yellow Bilirubin: Neg mg/dL  Appearance: Clear Ketones: Neg mg/dL  Specific Gravity: 1.015 Blood: Neg ery/uL  pH: 7.0 Protein: Trace mg/dL  Glucose: Neg mg/dL Urobilinogen: 0.2 mg/dL    Nitrites: Neg    Leukocyte Esterase: Neg leu/uL    ASSESSMENT:      ICD-10 Details  1 GU:   Prostate Cancer - C61    PLAN:           Schedule Return Visit/Planned Activity: Keep Scheduled Appointment          Document Letter(s):  Created for Patient: Clinical Summary         Notes:   1. Intermediate risk prostate cancer: After discussion today, he confirms his decision to proceed with surgical treatment. We again reviewed our goals of trying to provide excellent cancer control in limiting the impact on his quality of life with potential side effects that may include incontinence and erectile dysfunction among others as previously discussed. We discussed surgical therapy for prostate cancer including the different available surgical approaches. We discussed, in detail, the risks and expectations of surgery with regard to cancer control, urinary control, and erectile  function as well as the expected postoperative recovery process. Additional risks of surgery including but not limited to bleeding, infection, hernia formation, nerve damage, lymphocele formation, bowel/rectal injury potentially necessitating colostomy, damage to the urinary tract resulting in urine leakage, urethral stricture, and the cardiopulmonary risks such as myocardial infarction, stroke, death, venothromboembolism, etc. were explained. The risk of open surgical conversion for robotic/laparoscopic prostatectomy was also discussed.   He will plan to proceed with a bilateral  nerve-sparing robot assisted laparoscopic radical prostatectomy and pelvic lymphadenectomy.   Cc: Dr. Garret Reddish         Next Appointment:      Next Appointment: 07/15/2020 10:00 AM    Appointment Type: 60 Physical Therapy    Location: [facility] [phone]    Provider: Lovenia Kim      * Signed by [name], M.D. on 07/01/20 at 4:59 PM (EDT)*

## 2020-07-27 ENCOUNTER — Encounter (HOSPITAL_COMMUNITY): Payer: Self-pay | Admitting: Urology

## 2020-07-28 ENCOUNTER — Other Ambulatory Visit: Payer: Self-pay

## 2020-07-28 ENCOUNTER — Ambulatory Visit (HOSPITAL_COMMUNITY): Payer: No Typology Code available for payment source | Admitting: Anesthesiology

## 2020-07-28 ENCOUNTER — Encounter (HOSPITAL_COMMUNITY): Payer: Self-pay | Admitting: Urology

## 2020-07-28 ENCOUNTER — Observation Stay (HOSPITAL_COMMUNITY)
Admission: RE | Admit: 2020-07-28 | Discharge: 2020-07-29 | Disposition: A | Discharge status: Home / Self Care | Payer: No Typology Code available for payment source | Attending: Urology | Admitting: Urology

## 2020-07-28 ENCOUNTER — Ambulatory Visit (HOSPITAL_COMMUNITY): Payer: No Typology Code available for payment source | Admitting: Physician Assistant

## 2020-07-28 ENCOUNTER — Encounter (HOSPITAL_COMMUNITY)
Admission: RE | Disposition: A | Discharge status: Home / Self Care | Payer: Self-pay | Source: Home / Self Care | Attending: Urology

## 2020-07-28 DIAGNOSIS — C61 Malignant neoplasm of prostate: Principal | ICD-10-CM | POA: Insufficient documentation

## 2020-07-28 DIAGNOSIS — I10 Essential (primary) hypertension: Secondary | ICD-10-CM | POA: Insufficient documentation

## 2020-07-28 DIAGNOSIS — Z8546 Personal history of malignant neoplasm of prostate: Secondary | ICD-10-CM | POA: Diagnosis present

## 2020-07-28 DIAGNOSIS — I2510 Atherosclerotic heart disease of native coronary artery without angina pectoris: Secondary | ICD-10-CM | POA: Insufficient documentation

## 2020-07-28 DIAGNOSIS — D360 Benign neoplasm of lymph nodes: Secondary | ICD-10-CM | POA: Insufficient documentation

## 2020-07-28 DIAGNOSIS — Z7982 Long term (current) use of aspirin: Secondary | ICD-10-CM | POA: Diagnosis not present

## 2020-07-28 DIAGNOSIS — Z79899 Other long term (current) drug therapy: Secondary | ICD-10-CM | POA: Insufficient documentation

## 2020-07-28 HISTORY — PX: LYMPHADENECTOMY: SHX5960

## 2020-07-28 HISTORY — PX: ROBOT ASSISTED LAPAROSCOPIC RADICAL PROSTATECTOMY: SHX5141

## 2020-07-28 LAB — ABO/RH: ABO/RH(D): AB POS

## 2020-07-28 LAB — HEMOGLOBIN AND HEMATOCRIT, BLOOD
HCT: 42.2 % (ref 39.0–52.0)
Hemoglobin: 14.3 g/dL (ref 13.0–17.0)

## 2020-07-28 SURGERY — XI ROBOTIC ASSISTED LAPAROSCOPIC RADICAL PROSTATECTOMY LEVEL 2
Anesthesia: General | Site: Abdomen

## 2020-07-28 MED ORDER — TRAZODONE HCL 50 MG PO TABS
25.0000 mg | ORAL_TABLET | Freq: Every evening | ORAL | Status: DC | PRN
  Administered 2020-07-28: 50 mg via ORAL
  Filled 2020-07-28: qty 1

## 2020-07-28 MED ORDER — SUGAMMADEX SODIUM 200 MG/2ML IV SOLN
INTRAVENOUS | Status: DC | PRN
  Administered 2020-07-28: 200 mg via INTRAVENOUS

## 2020-07-28 MED ORDER — MIDAZOLAM HCL 5 MG/5ML IJ SOLN
INTRAMUSCULAR | Status: DC | PRN
  Administered 2020-07-28: 2 mg via INTRAVENOUS

## 2020-07-28 MED ORDER — LACTATED RINGERS IV SOLN: INTRAVENOUS | Status: DC

## 2020-07-28 MED ORDER — GLYCOPYRROLATE PF 0.2 MG/ML IJ SOSY
PREFILLED_SYRINGE | INTRAMUSCULAR | Status: DC | PRN
  Administered 2020-07-28 (×2): .2 mg via INTRAVENOUS

## 2020-07-28 MED ORDER — FENTANYL CITRATE (PF) 250 MCG/5ML IJ SOLN
INTRAMUSCULAR | Status: AC
  Filled 2020-07-28: qty 5

## 2020-07-28 MED ORDER — FENTANYL CITRATE (PF) 100 MCG/2ML IJ SOLN
INTRAMUSCULAR | Status: DC | PRN
  Administered 2020-07-28 (×2): 50 ug via INTRAVENOUS
  Administered 2020-07-28: 100 ug via INTRAVENOUS

## 2020-07-28 MED ORDER — ROSUVASTATIN CALCIUM 20 MG PO TABS
20.0000 mg | ORAL_TABLET | Freq: Every day | ORAL | Status: DC
  Administered 2020-07-28: 20 mg via ORAL
  Filled 2020-07-28 (×2): qty 1

## 2020-07-28 MED ORDER — CEFAZOLIN SODIUM-DEXTROSE 2-4 GM/100ML-% IV SOLN
2.0000 g | Freq: Once | INTRAVENOUS | Status: AC
  Administered 2020-07-28: 2 g via INTRAVENOUS
  Filled 2020-07-28: qty 100

## 2020-07-28 MED ORDER — TRAMADOL HCL 50 MG PO TABS: 50.0000 mg | ORAL_TABLET | Freq: Four times a day (QID) | ORAL | 0 refills | Status: DC | PRN

## 2020-07-28 MED ORDER — ROCURONIUM BROMIDE 10 MG/ML (PF) SYRINGE
PREFILLED_SYRINGE | INTRAVENOUS | Status: AC
  Filled 2020-07-28: qty 20

## 2020-07-28 MED ORDER — LIDOCAINE 2% (20 MG/ML) 5 ML SYRINGE
INTRAMUSCULAR | Status: DC | PRN
  Administered 2020-07-28: 80 mg via INTRAVENOUS

## 2020-07-28 MED ORDER — BELLADONNA ALKALOIDS-OPIUM 16.2-60 MG RE SUPP
1.0000 | Freq: Four times a day (QID) | RECTAL | Status: DC | PRN
  Administered 2020-07-28: 1 via RECTAL

## 2020-07-28 MED ORDER — MIDAZOLAM HCL 2 MG/2ML IJ SOLN
INTRAMUSCULAR | Status: AC
  Filled 2020-07-28: qty 2

## 2020-07-28 MED ORDER — ORAL CARE MOUTH RINSE: 15.0000 mL | Freq: Once | OROMUCOSAL | Status: AC

## 2020-07-28 MED ORDER — FLEET ENEMA 7-19 GM/118ML RE ENEM
1.0000 | ENEMA | Freq: Once | RECTAL | Status: DC
  Filled 2020-07-28: qty 1

## 2020-07-28 MED ORDER — PROPOFOL 10 MG/ML IV BOLUS
INTRAVENOUS | Status: AC
  Filled 2020-07-28: qty 40

## 2020-07-28 MED ORDER — DEXAMETHASONE SODIUM PHOSPHATE 10 MG/ML IJ SOLN
INTRAMUSCULAR | Status: AC
  Filled 2020-07-28: qty 2

## 2020-07-28 MED ORDER — KCL IN DEXTROSE-NACL 20-5-0.45 MEQ/L-%-% IV SOLN
INTRAVENOUS | Status: DC
  Filled 2020-07-28 (×3): qty 1000

## 2020-07-28 MED ORDER — ONDANSETRON HCL 4 MG/2ML IJ SOLN
INTRAMUSCULAR | Status: DC | PRN
  Administered 2020-07-28: 4 mg via INTRAVENOUS

## 2020-07-28 MED ORDER — BUPIVACAINE-EPINEPHRINE 0.25% -1:200000 IJ SOLN
INTRAMUSCULAR | Status: DC | PRN
  Administered 2020-07-28: 30 mL

## 2020-07-28 MED ORDER — LIDOCAINE 2% (20 MG/ML) 5 ML SYRINGE
INTRAMUSCULAR | Status: AC
  Filled 2020-07-28: qty 10

## 2020-07-28 MED ORDER — MEPERIDINE HCL 50 MG/ML IJ SOLN: 6.2500 mg | INTRAMUSCULAR | Status: DC | PRN

## 2020-07-28 MED ORDER — LACTATED RINGERS IV SOLN
INTRAVENOUS | Status: DC | PRN
  Administered 2020-07-28: 1 mL

## 2020-07-28 MED ORDER — ONDANSETRON HCL 4 MG/2ML IJ SOLN: 4.0000 mg | INTRAMUSCULAR | Status: DC | PRN

## 2020-07-28 MED ORDER — BUPIVACAINE-EPINEPHRINE (PF) 0.25% -1:200000 IJ SOLN
INTRAMUSCULAR | Status: AC
  Filled 2020-07-28: qty 30

## 2020-07-28 MED ORDER — PROPOFOL 10 MG/ML IV BOLUS
INTRAVENOUS | Status: DC | PRN
  Administered 2020-07-28: 140 mg via INTRAVENOUS

## 2020-07-28 MED ORDER — SULFAMETHOXAZOLE-TRIMETHOPRIM 800-160 MG PO TABS
1.0000 | ORAL_TABLET | Freq: Two times a day (BID) | ORAL | 0 refills | Status: DC
Start: 2020-07-28 — End: 2020-11-21

## 2020-07-28 MED ORDER — SODIUM CHLORIDE 0.9 % IV BOLUS
1000.0000 mL | Freq: Once | INTRAVENOUS | Status: AC
  Administered 2020-07-28: 1000 mL via INTRAVENOUS

## 2020-07-28 MED ORDER — BACITRACIN-NEOMYCIN-POLYMYXIN 400-5-5000 EX OINT: 1.0000 "application " | TOPICAL_OINTMENT | Freq: Three times a day (TID) | CUTANEOUS | Status: DC | PRN

## 2020-07-28 MED ORDER — BELLADONNA ALKALOIDS-OPIUM 16.2-60 MG RE SUPP
RECTAL | Status: AC
  Filled 2020-07-28: qty 1

## 2020-07-28 MED ORDER — DIPHENHYDRAMINE HCL 12.5 MG/5ML PO ELIX: 12.5000 mg | ORAL_SOLUTION | Freq: Four times a day (QID) | ORAL | Status: DC | PRN

## 2020-07-28 MED ORDER — GLYCOPYRROLATE PF 0.2 MG/ML IJ SOSY
PREFILLED_SYRINGE | INTRAMUSCULAR | Status: AC
  Filled 2020-07-28: qty 2

## 2020-07-28 MED ORDER — DEXAMETHASONE SODIUM PHOSPHATE 10 MG/ML IJ SOLN
INTRAMUSCULAR | Status: DC | PRN
  Administered 2020-07-28: 10 mg via INTRAVENOUS

## 2020-07-28 MED ORDER — ROCURONIUM BROMIDE 10 MG/ML (PF) SYRINGE
PREFILLED_SYRINGE | INTRAVENOUS | Status: DC | PRN
  Administered 2020-07-28: 20 mg via INTRAVENOUS
  Administered 2020-07-28: 60 mg via INTRAVENOUS
  Administered 2020-07-28: 20 mg via INTRAVENOUS

## 2020-07-28 MED ORDER — KETOROLAC TROMETHAMINE 15 MG/ML IJ SOLN
15.0000 mg | Freq: Four times a day (QID) | INTRAMUSCULAR | Status: DC
  Administered 2020-07-28 – 2020-07-29 (×3): 15 mg via INTRAVENOUS
  Filled 2020-07-28 (×3): qty 1

## 2020-07-28 MED ORDER — PROMETHAZINE HCL 25 MG/ML IJ SOLN: 6.2500 mg | INTRAMUSCULAR | Status: DC | PRN

## 2020-07-28 MED ORDER — ACETAMINOPHEN 325 MG PO TABS: 650.0000 mg | ORAL_TABLET | ORAL | Status: DC | PRN

## 2020-07-28 MED ORDER — LOSARTAN POTASSIUM 25 MG PO TABS
25.0000 mg | ORAL_TABLET | Freq: Every day | ORAL | Status: DC
  Administered 2020-07-29: 25 mg via ORAL
  Filled 2020-07-28: qty 1

## 2020-07-28 MED ORDER — ONDANSETRON HCL 4 MG/2ML IJ SOLN
INTRAMUSCULAR | Status: AC
  Filled 2020-07-28: qty 4

## 2020-07-28 MED ORDER — SODIUM CHLORIDE 0.9 % IR SOLN
Status: DC | PRN
  Administered 2020-07-28: 1000 mL

## 2020-07-28 MED ORDER — HYDROMORPHONE HCL 1 MG/ML IJ SOLN
INTRAMUSCULAR | Status: DC | PRN
  Administered 2020-07-28: 1 mg via INTRAVENOUS

## 2020-07-28 MED ORDER — ZOLPIDEM TARTRATE 5 MG PO TABS: 5.0000 mg | ORAL_TABLET | Freq: Every evening | ORAL | Status: DC | PRN

## 2020-07-28 MED ORDER — MORPHINE SULFATE (PF) 2 MG/ML IV SOLN
2.0000 mg | INTRAVENOUS | Status: DC | PRN
  Administered 2020-07-28 (×2): 2 mg via INTRAVENOUS
  Filled 2020-07-28 (×2): qty 1

## 2020-07-28 MED ORDER — CHLORHEXIDINE GLUCONATE 0.12 % MT SOLN
15.0000 mL | Freq: Once | OROMUCOSAL | Status: AC
  Administered 2020-07-28: 15 mL via OROMUCOSAL

## 2020-07-28 MED ORDER — HYDROMORPHONE HCL 1 MG/ML IJ SOLN: 0.2500 mg | INTRAMUSCULAR | Status: DC | PRN

## 2020-07-28 MED ORDER — MAGNESIUM CITRATE PO SOLN
1.0000 | Freq: Once | ORAL | Status: DC
  Filled 2020-07-28: qty 296

## 2020-07-28 MED ORDER — STERILE WATER FOR IRRIGATION IR SOLN
Status: DC | PRN
  Administered 2020-07-28: 1000 mL

## 2020-07-28 MED ORDER — CEFAZOLIN SODIUM-DEXTROSE 1-4 GM/50ML-% IV SOLN
1.0000 g | Freq: Three times a day (TID) | INTRAVENOUS | Status: AC
  Administered 2020-07-28 – 2020-07-29 (×2): 1 g via INTRAVENOUS
  Filled 2020-07-28 (×2): qty 50

## 2020-07-28 MED ORDER — HEPARIN SODIUM (PORCINE) 1000 UNIT/ML IJ SOLN
INTRAMUSCULAR | Status: AC
  Filled 2020-07-28: qty 1

## 2020-07-28 MED ORDER — HYDROMORPHONE HCL 2 MG/ML IJ SOLN
INTRAMUSCULAR | Status: AC
  Filled 2020-07-28: qty 1

## 2020-07-28 MED ORDER — DOCUSATE SODIUM 100 MG PO CAPS
100.0000 mg | ORAL_CAPSULE | Freq: Two times a day (BID) | ORAL | Status: DC
  Administered 2020-07-28 – 2020-07-29 (×2): 100 mg via ORAL
  Filled 2020-07-28 (×2): qty 1

## 2020-07-28 MED ORDER — DIPHENHYDRAMINE HCL 50 MG/ML IJ SOLN: 12.5000 mg | Freq: Four times a day (QID) | INTRAMUSCULAR | Status: DC | PRN

## 2020-07-28 MED ORDER — CHLORHEXIDINE GLUCONATE CLOTH 2 % EX PADS
6.0000 | MEDICATED_PAD | Freq: Every day | CUTANEOUS | Status: DC
  Administered 2020-07-29 (×2): 6 via TOPICAL

## 2020-07-28 SURGICAL SUPPLY — 61 items
APPLICATOR COTTON TIP 6 STRL (MISCELLANEOUS) ×2 IMPLANT
APPLICATOR COTTON TIP 6IN STRL (MISCELLANEOUS) ×4
CATH FOLEY 2WAY SLVR 18FR 30CC (CATHETERS) ×4 IMPLANT
CATH ROBINSON RED A/P 16FR (CATHETERS) ×4 IMPLANT
CATH ROBINSON RED A/P 8FR (CATHETERS) ×4 IMPLANT
CATH TIEMANN FOLEY 18FR 5CC (CATHETERS) ×4 IMPLANT
CHLORAPREP W/TINT 26 (MISCELLANEOUS) ×4 IMPLANT
CLIP VESOLOCK LG 6/CT PURPLE (CLIP) ×8 IMPLANT
COVER SURGICAL LIGHT HANDLE (MISCELLANEOUS) ×4 IMPLANT
COVER TIP SHEARS 8 DVNC (MISCELLANEOUS) ×2 IMPLANT
COVER TIP SHEARS 8MM DA VINCI (MISCELLANEOUS) ×2
COVER WAND RF STERILE (DRAPES) IMPLANT
CUTTER ECHEON FLEX ENDO 45 340 (ENDOMECHANICALS) ×4 IMPLANT
DECANTER SPIKE VIAL GLASS SM (MISCELLANEOUS) IMPLANT
DERMABOND ADVANCED (GAUZE/BANDAGES/DRESSINGS) ×2
DERMABOND ADVANCED .7 DNX12 (GAUZE/BANDAGES/DRESSINGS) ×2 IMPLANT
DRAIN CHANNEL RND F F (WOUND CARE) ×4 IMPLANT
DRAPE ARM DVNC X/XI (DISPOSABLE) ×8 IMPLANT
DRAPE COLUMN DVNC XI (DISPOSABLE) ×2 IMPLANT
DRAPE DA VINCI XI ARM (DISPOSABLE) ×8
DRAPE DA VINCI XI COLUMN (DISPOSABLE) ×2
DRAPE SURG IRRIG POUCH 19X23 (DRAPES) ×4 IMPLANT
DRSG TEGADERM 4X4.75 (GAUZE/BANDAGES/DRESSINGS) ×4 IMPLANT
ELECT PENCIL ROCKER SW 15FT (MISCELLANEOUS) ×4 IMPLANT
ELECT REM PT RETURN 15FT ADLT (MISCELLANEOUS) ×4 IMPLANT
GAUZE SPONGE 4X4 12PLY STRL (GAUZE/BANDAGES/DRESSINGS) ×4 IMPLANT
GLOVE BIO SURGEON STRL SZ 6.5 (GLOVE) ×3 IMPLANT
GLOVE BIO SURGEONS STRL SZ 6.5 (GLOVE) ×1
GLOVE BIOGEL M STRL SZ7.5 (GLOVE) ×8 IMPLANT
GOWN STRL REUS W/TWL LRG LVL3 (GOWN DISPOSABLE) ×16 IMPLANT
HOLDER FOLEY CATH W/STRAP (MISCELLANEOUS) ×4 IMPLANT
IRRIG SUCT STRYKERFLOW 2 WTIP (MISCELLANEOUS) ×4
IRRIGATION SUCT STRKRFLW 2 WTP (MISCELLANEOUS) ×2 IMPLANT
IV LACTATED RINGERS 1000ML (IV SOLUTION) ×4 IMPLANT
KIT TURNOVER KIT A (KITS) IMPLANT
NDL SAFETY ECLIPSE 18X1.5 (NEEDLE) ×2 IMPLANT
NEEDLE HYPO 18GX1.5 SHARP (NEEDLE) ×2
PACK ROBOT UROLOGY CUSTOM (CUSTOM PROCEDURE TRAY) ×4 IMPLANT
PENCIL SMOKE EVACUATOR (MISCELLANEOUS) IMPLANT
SEAL CANN UNIV 5-8 DVNC XI (MISCELLANEOUS) ×8 IMPLANT
SEAL XI 5MM-8MM UNIVERSAL (MISCELLANEOUS) ×8
SET IRRIG Y TYPE TUR BLADDER L (SET/KITS/TRAYS/PACK) ×4 IMPLANT
SET TUBE SMOKE EVAC HIGH FLOW (TUBING) ×4 IMPLANT
SOLUTION ELECTROLUBE (MISCELLANEOUS) ×4 IMPLANT
STAPLE RELOAD 45 GRN (STAPLE) ×2 IMPLANT
STAPLE RELOAD 45MM GREEN (STAPLE) ×2
SUT ETHILON 3 0 PS 1 (SUTURE) ×4 IMPLANT
SUT MNCRL 3 0 RB1 (SUTURE) ×2 IMPLANT
SUT MNCRL 3 0 VIOLET RB1 (SUTURE) ×2 IMPLANT
SUT MNCRL AB 4-0 PS2 18 (SUTURE) ×8 IMPLANT
SUT MONOCRYL 3 0 RB1 (SUTURE) ×4
SUT VIC AB 0 CT1 27 (SUTURE) ×2
SUT VIC AB 0 CT1 27XBRD ANTBC (SUTURE) ×2 IMPLANT
SUT VIC AB 0 UR5 27 (SUTURE) ×4 IMPLANT
SUT VIC AB 2-0 SH 27 (SUTURE) ×2
SUT VIC AB 2-0 SH 27X BRD (SUTURE) ×2 IMPLANT
SUT VICRYL 0 UR6 27IN ABS (SUTURE) ×8 IMPLANT
SYR 27GX1/2 1ML LL SAFETY (SYRINGE) ×4 IMPLANT
TOWEL OR NON WOVEN STRL DISP B (DISPOSABLE) ×4 IMPLANT
TROCAR XCEL NON-BLD 5MMX100MML (ENDOMECHANICALS) ×4 IMPLANT
WATER STERILE IRR 1000ML POUR (IV SOLUTION) ×4 IMPLANT

## 2020-07-28 NOTE — Interval H&P Note (Signed)
History and Physical Interval Note:  07/28/2020 6:53 AM  Patrick [name]  has presented today for surgery, with the diagnosis of PROSTATE CANCER.  The various methods of treatment have been discussed with the patient and family. After consideration of risks, benefits and other options for treatment, the patient has consented to  Procedure(s): XI ROBOTIC ASSISTED LAPAROSCOPIC RADICAL PROSTATECTOMY LEVEL 2 (N/A) LYMPHADENECTOMY, PELVIC (Bilateral) as a surgical intervention.  The patient's history has been reviewed, patient examined, no change in status, stable for surgery.  I have reviewed the patient's chart and labs.  Questions were answered to the patient's satisfaction.     [employer]

## 2020-07-28 NOTE — Anesthesia Procedure Notes (Signed)
Procedure Name: Intubation Date/Time: 07/28/2020 7:38 AM Performed by: Lavina Hamman, CRNA Pre-anesthesia Checklist: Patient identified, Emergency Drugs available, Suction available, Patient being monitored and Timeout performed Patient Re-evaluated:Patient Re-evaluated prior to induction Oxygen Delivery Method: Circle system utilized Preoxygenation: Pre-oxygenation with 100% oxygen Induction Type: IV induction Ventilation: Mask ventilation without difficulty and Oral airway inserted - appropriate to patient size Laryngoscope Size: Mac and 3 Grade View: Grade II Tube type: Oral Tube size: 7.5 mm Number of attempts: 1 Airway Equipment and Method: Stylet Placement Confirmation: ETT inserted through vocal cords under direct vision,  positive ETCO2,  CO2 detector and breath sounds checked- equal and bilateral Secured at: 23 cm Tube secured with: Tape Dental Injury: Teeth and Oropharynx as per pre-operative assessment  Comments: One DL by EMS Student, no pass of ett, atraumatic intubation by LK.

## 2020-07-28 NOTE — Transfer of Care (Signed)
Immediate Anesthesia Transfer of Care Note  Patient: Patrick [name]  Procedure(s) Performed: Procedure(s): XI ROBOTIC ASSISTED LAPAROSCOPIC RADICAL PROSTATECTOMY LEVEL 2 (N/A) LYMPHADENECTOMY, PELVIC (Bilateral)  Patient Location: PACU  Anesthesia Type:General  Level of Consciousness:  sedated, patient cooperative and responds to stimulation  Airway & Oxygen Therapy:Patient Spontanous Breathing and Patient connected to face mask oxgen  Post-op Assessment:  Report given to PACU RN and Post -op Vital signs reviewed and stable  Post vital signs:  Reviewed and stable  Last Vitals:  Vitals:   07/28/20 0556 07/28/20 1013  BP: (!) 143/81 126/65  Pulse: 77 76  Resp: 16 18  Temp: 36.8 C (P) 36.7 C  SpO2: 142% 767%    Complications: No apparent anesthesia complications

## 2020-07-28 NOTE — Discharge Instructions (Signed)

## 2020-07-28 NOTE — Plan of Care (Signed)
All belongings were placed within reach. Call bell in place

## 2020-07-28 NOTE — Op Note (Signed)
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 Robotic assisted laparoscopic radical prostatectomy (bilateral nerve sparing) 2. Bilateral robotic assisted laparoscopic pelvic lymphadenectomy  Surgeon: Pryor Curia. M.D.  Assistant: Debbrah Alar, PA-C  An assistant was required for this surgical procedure.  The duties of the assistant included but were not limited to suctioning, passing suture, camera manipulation, retraction. This procedure would not be able to be performed without an Environmental consultant.  Resident: Dr. Carmie Kanner  Anesthesia: General  Complications: None  EBL: 75 mL  IVF:  1200 mL crystalloid  Specimens: 1. Prostate and seminal vesicles 2. Right pelvic lymph nodes 3. Left pelvic lymph nodes  Disposition of specimens: Pathology  Drains: 1. 20 Fr coude catheter 2. # 19 Blake pelvic drain  Indication: Patrick [name] is a 64 y.o. year old patient with clinically localized prostate cancer.  After a thorough review of the management options for treatment of prostate cancer, he elected to proceed with surgical therapy and the above procedure(s).  We have discussed the potential benefits and risks of the procedure, side effects of the proposed treatment, the likelihood of the patient achieving the goals of the procedure, and any potential problems that might occur during the procedure or recuperation. Informed consent has been obtained.  Description of procedure:  The patient was taken to the operating room and a general anesthetic was administered. He was given preoperative antibiotics, placed in the dorsal lithotomy position, and prepped and draped in the usual sterile fashion. Next a preoperative timeout was performed. A urethral catheter was placed into the bladder and a site was  selected near the umbilicus for placement of the camera port. This was placed using a standard open Hassan technique which allowed entry into the peritoneal cavity under direct vision and without difficulty. An 8 mm robotic port was placed and a pneumoperitoneum established. The camera was then used to inspect the abdomen and there was no evidence of any intra-abdominal injuries or other abnormalities. The remaining abdominal ports were then placed. 8 mm robotic ports were placed in the right lower quadrant, left lower quadrant, and far left lateral abdominal wall. A 5 mm port was placed in the right upper quadrant and a 12 mm port was placed in the right lateral abdominal wall for laparoscopic assistance. All ports were placed under direct vision without difficulty. The surgical cart was then docked.   Utilizing the cautery scissors, the bladder was reflected posteriorly allowing entry into the space of Retzius and identification of the endopelvic fascia and prostate. The periprostatic fat was then removed from the prostate allowing full exposure of the endopelvic fascia. The endopelvic fascia was then incised from the apex back to the base of the prostate bilaterally and the underlying levator muscle fibers were swept laterally off the prostate thereby isolating the dorsal venous complex. The dorsal vein was then stapled and divided with a 45 mm Flex Echelon stapler. Attention then turned to the bladder neck which was divided anteriorly thereby allowing entry into the bladder and exposure of the urethral catheter. The catheter balloon was deflated and the catheter was brought into the operative field and used to retract the prostate anteriorly. The posterior bladder neck was then examined and was divided allowing further dissection between the bladder and prostate posteriorly until the vasa deferentia and seminal vessels were identified. The vasa deferentia were  isolated, divided, and lifted anteriorly. The  seminal vesicles were dissected down to their tips with care to control the seminal vascular arterial blood supply. These structures were then lifted anteriorly and the space between Denonvillier's fascia and the anterior rectum was developed with a combination of sharp and blunt dissection. This isolated the vascular pedicles of the prostate.  The lateral prostatic fascia was then sharply incised allowing release of the neurovascular bundles bilaterally. The vascular pedicles of the prostate were then ligated with Weck clips between the prostate and neurovascular bundles and divided with sharp cold scissor dissection resulting in neurovascular bundle preservation. The neurovascular bundles were then separated off the apex of the prostate and urethra bilaterally.  The urethra was then sharply transected allowing the prostate specimen to be disarticulated. The pelvis was copiously irrigated and hemostasis was ensured. There was no evidence for rectal injury.  Attention then turned to the right pelvic sidewall. The fibrofatty tissue between the external iliac vein, confluence of the iliac vessels, hypogastric artery, and Cooper's ligament was dissected free from the pelvic sidewall with care to preserve the obturator nerve. Weck clips were used for lymphostasis and hemostasis. An identical procedure was performed on the contralateral side and the lymphatic packets were removed for permanent pathologic analysis.  Attention then turned to the urethral anastomosis. A 2-0 Vicryl slip knot was placed between Denonvillier's fascia, the posterior bladder neck, and the posterior urethra to reapproximate these structures. A double-armed 3-0 Monocryl suture was then used to perform a 360 running tension-free anastomosis between the bladder neck and urethra. A new urethral catheter was then placed into the bladder and irrigated. There were no blood clots within the bladder and the anastomosis appeared to be watertight.  A #19 Blake drain was then brought through the left lateral 8 mm port site and positioned appropriately within the pelvis. It was secured to the skin with a nylon suture. The surgical cart was then undocked. The right lateral 12 mm port site was closed at the fascial level with a 0 Vicryl suture placed laparoscopically. All remaining ports were then removed under direct vision. The prostate specimen was removed intact within the Endopouch retrieval bag via the periumbilical camera port site. This fascial opening was closed with two running 0 Vicryl sutures. 0.25% Marcaine was then injected into all port sites and all incisions were reapproximated at the skin level with 4-0 Monocryl subcuticular sutures and Dermabond. The patient appeared to tolerate the procedure well and without complications. The patient was able to be extubated and transferred to the recovery unit in satisfactory condition.   Pryor Curia MD

## 2020-07-28 NOTE — Progress Notes (Signed)
Pharmacy update: [facility] [address] in [place], [place]

## 2020-07-28 NOTE — Progress Notes (Signed)
Patient ID: Patrick [name], male   DOB: 11/11/1956, 64 y.o.   MRN: [id number]  Post-op note  Subjective: The patient is doing well.  No complaints.  Objective: Vital signs in last 24 hours: Temp:  [97.7 F (36.5 C)-99 F (37.2 C)] 98.5 F (36.9 C) (08/09 1444) Pulse Rate:  [63-77] 64 (08/09 1444) Resp:  [11-18] 15 (08/09 1444) BP: (111-143)/(65-81) 111/69 (08/09 1444) SpO2:  [97 %-100 %] 97 % (08/09 1444) Weight:  [83.1 kg] 83.1 kg (08/09 0554)  Intake/Output from previous day: No intake/output data recorded. Intake/Output this shift: Total I/O In: 1200 [I.V.:1200] Out: 330 [Urine:225; Drains:30; Blood:75]  Physical Exam:  General: Alert and oriented. Abdomen: Soft, Nondistended. Incisions: Clean and dry. GU: Urine clearing.  Lab Results: Recent Labs    07/28/20 1042  HGB 14.3  HCT 42.2    Assessment/Plan: POD#0   1) Continue to monitor, ambulate, IS   Patrick [name]. MD   LOS: 0 days   Patrick [name] 07/28/2020, 3:29 PM

## 2020-07-28 NOTE — Anesthesia Preprocedure Evaluation (Signed)
Anesthesia Evaluation  Patient identified by MRN, date of birth, ID band Patient awake    Reviewed: Allergy & Precautions, H&P , NPO status , Patient's Chart, lab work & pertinent test results  Airway Mallampati: II  TM Distance: >3 FB Neck ROM: Full    Dental  (+) Teeth Intact, Dental Advisory Given   Pulmonary neg pulmonary ROS,    Pulmonary exam normal breath sounds clear to auscultation       Cardiovascular hypertension, Pt. on medications + CAD  Normal cardiovascular exam Rhythm:Regular Rate:Normal     Neuro/Psych PSYCHIATRIC DISORDERS Anxiety negative neurological ROS     GI/Hepatic negative GI ROS, Neg liver ROS,   Endo/Other  negative endocrine ROS  Renal/GU negative Renal ROS     Musculoskeletal  (+) Arthritis ,   Abdominal   Peds  Hematology negative hematology ROS (+)   Anesthesia Other Findings   Reproductive/Obstetrics negative OB ROS                            Anesthesia Physical  Anesthesia Plan  ASA: II  Anesthesia Plan: General   Post-op Pain Management:    Induction: Intravenous  PONV Risk Score and Plan: 3 and Ondansetron, Dexamethasone, Treatment may vary due to age or medical condition and Midazolam  Airway Management Planned: Oral ETT  Additional Equipment: None  Intra-op Plan:   Post-operative Plan: Extubation in OR  Informed Consent: I have reviewed the patients History and Physical, chart, labs and discussed the procedure including the risks, benefits and alternatives for the proposed anesthesia with the patient or authorized representative who has indicated his/her understanding and acceptance.     Dental advisory given  Plan Discussed with: CRNA  Anesthesia Plan Comments:         Anesthesia Quick Evaluation

## 2020-07-28 NOTE — Progress Notes (Signed)
Patient ambulated 240 feet. He tolerated the activity very well. He was supervised by the RN.

## 2020-07-28 NOTE — Anesthesia Postprocedure Evaluation (Signed)
Anesthesia Post Note  Patient: Patrick [name]  Procedure(s) Performed: XI ROBOTIC ASSISTED LAPAROSCOPIC RADICAL PROSTATECTOMY LEVEL 2 (N/A Abdomen) LYMPHADENECTOMY, PELVIC (Bilateral Abdomen)     Patient location during evaluation: PACU Anesthesia Type: General Level of consciousness: sedated and patient cooperative Pain management: pain level controlled Vital Signs Assessment: post-procedure vital signs reviewed and stable Respiratory status: spontaneous breathing Cardiovascular status: stable Anesthetic complications: no   No complications documented.  Last Vitals:  Vitals:   07/28/20 1400 07/28/20 1444  BP: 119/66 111/69  Pulse: 66 64  Resp: 12 15  Temp: 36.8 C 36.9 C  SpO2: 99% 97%    Last Pain:  Vitals:   07/28/20 1444  TempSrc: Oral  PainSc:                  [name]

## 2020-07-28 NOTE — Progress Notes (Signed)
Patient ambulated  180 feet. Activity tolerated well.  

## 2020-07-29 ENCOUNTER — Encounter (HOSPITAL_COMMUNITY): Payer: Self-pay | Admitting: Urology

## 2020-07-29 DIAGNOSIS — C61 Malignant neoplasm of prostate: Secondary | ICD-10-CM | POA: Diagnosis not present

## 2020-07-29 LAB — HEMOGLOBIN AND HEMATOCRIT, BLOOD
HCT: 34.4 % — ABNORMAL LOW (ref 39.0–52.0)
HCT: 38.7 % — ABNORMAL LOW (ref 39.0–52.0)
Hemoglobin: 11.4 g/dL — ABNORMAL LOW (ref 13.0–17.0)
Hemoglobin: 13 g/dL (ref 13.0–17.0)

## 2020-07-29 MED ORDER — TRAMADOL HCL 50 MG PO TABS
50.0000 mg | ORAL_TABLET | Freq: Four times a day (QID) | ORAL | Status: DC | PRN
  Administered 2020-07-29: 50 mg via ORAL
  Filled 2020-07-29: qty 1

## 2020-07-29 MED ORDER — BISACODYL 10 MG RE SUPP
10.0000 mg | Freq: Once | RECTAL | Status: DC
  Filled 2020-07-29: qty 1

## 2020-07-29 NOTE — Progress Notes (Signed)
Patient ambulated 240 feet in the hallway and the activity was very well tolerated. Had slight pain in the abdomen, medicated with the scheduled pain med. Will continue to monitor.

## 2020-07-29 NOTE — Progress Notes (Signed)
Patient ID: Patrick [name], male   DOB: Nov 14, 1956, 64 y.o.   MRN: [id number]  1 Day Post-Op Subjective: The patient is doing well.  No nausea or vomiting. Pain is adequately controlled.  Objective: Vital signs in last 24 hours: Temp:  [97.6 F (36.4 C)-99.4 F (37.4 C)] 97.6 F (36.4 C) (08/10 0617) Pulse Rate:  [53-76] 53 (08/10 0617) Resp:  [11-18] 17 (08/10 0617) BP: (104-133)/(64-80) 104/67 (08/10 0617) SpO2:  [97 %-100 %] 99 % (08/10 0617)  Intake/Output from previous day: 08/09 0701 - 08/10 0700 In: 1255.2 [I.V.:1205.2; IV Piggyback:50] Out: 2690 [Urine:2500; Drains:115; Blood:75] Intake/Output this shift: Total I/O In: -  Out: 2330 [Urine:2275; Drains:55]  Physical Exam:  General: Alert and oriented. CV: RRR Lungs: Clear bilaterally. GI: Soft, Nondistended. Incisions: Clean, dry, and intact Urine: Clear Extremities: Nontender, no erythema, no edema.  Lab Results: Recent Labs    07/28/20 1042 07/29/20 0512  HGB 14.3 11.4*  HCT 42.2 34.4*      Assessment/Plan: POD# 1 s/p robotic prostatectomy.  1) SL IVF 2) Ambulate, Incentive spirometry 3) Transition to oral pain medication 4) Dulcolax suppository 5) D/C pelvic drain 6) Plan for likely discharge later today   Pryor Curia. MD   LOS: 0 days   Dutch Gray 07/29/2020, 6:56 AM

## 2020-07-29 NOTE — Progress Notes (Signed)
No change from am assessment. Pt remains stable alert oriented(x4). Discharge instructions/foley/incision care reviewed with patient and spouse. Questions and concerns were denied at this time.

## 2020-07-29 NOTE — Discharge Summary (Signed)
  Date of admission: 07/28/2020  Date of discharge: 07/29/2020  Admission diagnosis: Prostate Cancer  Discharge diagnosis: Prostate Cancer  History and Physical: For full details, please see admission history and physical. Briefly, Patrick [name] is a 64 y.o. gentleman with localized prostate cancer.  After discussing management/treatment options, he elected to proceed with surgical treatment.  Hospital Course: JARAMIAH BOSSARD was taken to the operating room on 07/28/2020 and underwent a robotic assisted laparoscopic radical prostatectomy. He tolerated this procedure well and without complications. Postoperatively, he was able to be transferred to a regular hospital room following recovery from anesthesia.  He was able to begin ambulating the night of surgery. He remained hemodynamically stable overnight.  He had excellent urine output with appropriately minimal output from his pelvic drain and his pelvic drain was removed on POD #1.  He was transitioned to oral pain medication, tolerated a clear liquid diet, and had met all discharge criteria and was able to be discharged home later on POD#1.  Laboratory values:  Recent Labs    07/28/20 1042 07/29/20 0512 07/29/20 1101  HGB 14.3 11.4* 13.0  HCT 42.2 34.4* 38.7*    Disposition: Home  Discharge instruction: He was instructed to be ambulatory but to refrain from heavy lifting, strenuous activity, or driving. He was instructed on urethral catheter care.  Discharge medications:   Allergies as of 07/29/2020   No Known Allergies     Medication List    STOP taking these medications   aspirin 81 MG tablet   cyclobenzaprine 10 MG tablet Commonly known as: FLEXERIL     TAKE these medications   losartan 25 MG tablet Commonly known as: COZAAR Take 1 tablet (25 mg total) by mouth daily.   rosuvastatin 20 MG tablet Commonly known as: CRESTOR TAKE 1 TABLET BY MOUTH EVERY DAY   sulfamethoxazole-trimethoprim 800-160 MG tablet Commonly  known as: BACTRIM DS Take 1 tablet by mouth 2 (two) times daily. Start the day prior to foley removal appointment   traMADol 50 MG tablet Commonly known as: Ultram Take 1-2 tablets (50-100 mg total) by mouth every 6 (six) hours as needed for moderate pain or severe pain.   traZODone 50 MG tablet Commonly known as: DESYREL TAKE 0.5-1 TABLETS (25-50 MG TOTAL) BY MOUTH AT BEDTIME AS NEEDED FOR SLEEP.       Followup: He will followup in 1 week for catheter removal and to discuss his surgical pathology results.

## 2020-08-01 LAB — SURGICAL PATHOLOGY

## 2020-09-29 ENCOUNTER — Telehealth: Payer: Self-pay

## 2020-09-29 DIAGNOSIS — E782 Mixed hyperlipidemia: Secondary | ICD-10-CM

## 2020-09-29 DIAGNOSIS — C61 Malignant neoplasm of prostate: Secondary | ICD-10-CM

## 2020-09-29 DIAGNOSIS — Z0000 Encounter for general adult medical examination without abnormal findings: Secondary | ICD-10-CM

## 2020-09-29 DIAGNOSIS — R739 Hyperglycemia, unspecified: Secondary | ICD-10-CM

## 2020-09-29 NOTE — Addendum Note (Signed)
Addended by: Marin Olp on: 09/29/2020 05:27 PM   Modules accepted: Orders

## 2020-09-29 NOTE — Telephone Encounter (Signed)
What labs would you like for [place] to order for the patient. Has been scheduled by Janett Billow to have blood work on 11-17-2020 and his physical is 11-21-2020. Please advise

## 2020-09-29 NOTE — Telephone Encounter (Signed)
I ordered labs today.

## 2020-09-29 NOTE — Telephone Encounter (Signed)
Pt is requesting labs for his physical done ahead of time.

## 2020-11-02 ENCOUNTER — Encounter: Payer: Self-pay | Admitting: Family Medicine

## 2020-11-03 ENCOUNTER — Other Ambulatory Visit: Payer: Self-pay

## 2020-11-03 MED ORDER — TRAZODONE HCL 50 MG PO TABS: 25.0000 mg | ORAL_TABLET | Freq: Every evening | ORAL | 1 refills | Status: DC | PRN

## 2020-11-12 ENCOUNTER — Other Ambulatory Visit: Payer: Self-pay

## 2020-11-12 ENCOUNTER — Other Ambulatory Visit: Payer: No Typology Code available for payment source

## 2020-11-12 DIAGNOSIS — R739 Hyperglycemia, unspecified: Secondary | ICD-10-CM

## 2020-11-12 DIAGNOSIS — C61 Malignant neoplasm of prostate: Secondary | ICD-10-CM

## 2020-11-12 DIAGNOSIS — Z0000 Encounter for general adult medical examination without abnormal findings: Secondary | ICD-10-CM

## 2020-11-12 DIAGNOSIS — E782 Mixed hyperlipidemia: Secondary | ICD-10-CM

## 2020-11-13 LAB — CBC WITH DIFFERENTIAL/PLATELET
Absolute Monocytes: 470 cells/uL (ref 200–950)
Basophils Absolute: 19 cells/uL (ref 0–200)
Basophils Relative: 0.4 %
Eosinophils Absolute: 130 cells/uL (ref 15–500)
Eosinophils Relative: 2.7 %
HCT: 43.8 % (ref 38.5–50.0)
Hemoglobin: 15.2 g/dL (ref 13.2–17.1)
Lymphs Abs: 1008 cells/uL (ref 850–3900)
MCH: 31.4 pg (ref 27.0–33.0)
MCHC: 34.7 g/dL (ref 32.0–36.0)
MCV: 90.5 fL (ref 80.0–100.0)
MPV: 11.7 fL (ref 7.5–12.5)
Monocytes Relative: 9.8 %
Neutro Abs: 3173 cells/uL (ref 1500–7800)
Neutrophils Relative %: 66.1 %
Platelets: 195 10*3/uL (ref 140–400)
RBC: 4.84 10*6/uL (ref 4.20–5.80)
RDW: 12.7 % (ref 11.0–15.0)
Total Lymphocyte: 21 %
WBC: 4.8 10*3/uL (ref 3.8–10.8)

## 2020-11-13 LAB — COMPREHENSIVE METABOLIC PANEL
AG Ratio: 1.9 (calc) (ref 1.0–2.5)
ALT: 29 U/L (ref 9–46)
AST: 23 U/L (ref 10–35)
Albumin: 4.3 g/dL (ref 3.6–5.1)
Alkaline phosphatase (APISO): 60 U/L (ref 35–144)
BUN: 13 mg/dL (ref 7–25)
CO2: 26 mmol/L (ref 20–32)
Calcium: 9.2 mg/dL (ref 8.6–10.3)
Chloride: 104 mmol/L (ref 98–110)
Creat: 0.81 mg/dL (ref 0.70–1.25)
Globulin: 2.3 g/dL (calc) (ref 1.9–3.7)
Glucose, Bld: 97 mg/dL (ref 65–99)
Potassium: 4.3 mmol/L (ref 3.5–5.3)
Sodium: 138 mmol/L (ref 135–146)
Total Bilirubin: 0.7 mg/dL (ref 0.2–1.2)
Total Protein: 6.6 g/dL (ref 6.1–8.1)

## 2020-11-13 LAB — LIPID PANEL
Cholesterol: 144 mg/dL (ref <200)
HDL: 53 mg/dL (ref >=40)
LDL Cholesterol (Calc): 72 mg/dL (calc)
Non-HDL Cholesterol (Calc): 91 mg/dL (calc) (ref <130)
Total CHOL/HDL Ratio: 2.7 (calc) (ref <5.0)
Triglycerides: 104 mg/dL (ref <150)

## 2020-11-13 LAB — HEMOGLOBIN A1C
Hgb A1c MFr Bld: 5.5 % of total Hgb (ref <5.7)
Mean Plasma Glucose: 111 (calc)
eAG (mmol/L): 6.2 (calc)

## 2020-11-13 LAB — PSA: PSA: <0.04 ng/mL (ref <=4.0)

## 2020-11-17 ENCOUNTER — Other Ambulatory Visit: Payer: No Typology Code available for payment source

## 2020-11-20 NOTE — Patient Instructions (Addendum)
Health Maintenance Due  Topic Date Due  . INFLUENZA VACCINE In office flu shot today 07/20/2020   Patient with some myalgias on rosuvastatin 20 mg daily.  LDL very close to goal of 70 or less at 72.  We opted to take 2 weeks off of medication and then cut in half and trial 10 mg daily.  I asked him to journal and see how his symptoms do during this transition period.  He will come back in 3 months for repeat labs to reassess.  He also has follow-up with Dr. Ellyn Hack next year and we can get his opinion  Schedule lab visit in 3 months. Does not have to be fasting but can be if you would like.

## 2020-11-20 NOTE — Progress Notes (Signed)
Phone: [phone]   Subjective:  Patient presents today for their annual physical. Chief complaint-noted.   See problem oriented charting- ROS- full  review of systems was completed and negative  except for: post nasal drip, erectile issues  The following were reviewed and entered/updated in epic: Past Medical History:  Diagnosis Date  . Cancer [facility])    prostate CA- bx done 11-18, hasn't started treatment  . Coronary artery calcification seen on CAT scan    Coronary Calcium Score (November 13, 2018)- 158.  Mild Calcium in all 3 Epicardial coronary arteries.  Slight TA dilation ~4 cm.   . Hyperlipidemia    Rosuvastatin just ordered, but not yet started.  . Hypertension   . 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 Prostate cancer ([place]) 05/25/2018    Priority: High  . Coronary artery calcification seen on CAT scan     Priority: Medium  . Hyperglycemia 05/17/2017    Priority: Medium  . History of skin cancer 06/15/2016    Priority: Medium  . Essential hypertension 06/09/2015    Priority: Medium  . Hyperlipidemia 02/28/2008    Priority: Medium  . History of adenomatous polyp of colon 10/27/2018    Priority: Low  . Rupture of UCL of right thumb 02/25/2020  . Anxiety 10/27/2018  . Rotator cuff syndrome of left shoulder 03/16/2016  . 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 Anterior tibialis tendinitis of left leg 01/02/2015   Past Surgical History:  Procedure Laterality Date  . A/C repair right  years ago   HS football injury  . COLONOSCOPY    . KNEE ARTHROSCOPY  11/15/2012   meniscus repair- Dr. [name], right  . LYMPHADENECTOMY Bilateral 07/28/2020   Procedure: LYMPHADENECTOMY, PELVIC;  Surgeon: [name], MD;  Location: [facility];  Service: Urology;  Laterality: Bilateral;  . PROSTATE BIOPSY     November 2018  . ROBOT ASSISTED  LAPAROSCOPIC RADICAL PROSTATECTOMY N/A 07/28/2020   Procedure: XI ROBOTIC ASSISTED LAPAROSCOPIC RADICAL PROSTATECTOMY LEVEL 2;  Surgeon: [name], MD;  Location: [facility];  Service: Urology;  Laterality: N/A;    Family History  Problem Relation Age of Onset  . Heart disease Father        85s heavy smoker  . Prostate cancer Father        70s or 8s  . Hyperlipidemia Father   . Diabetes Mother   . Cancer Mother 74       lung cancer-nonsmoker. 89 in 2020- toward end of life  . Lung cancer Mother   . Heart disease Brother   . Colon cancer Neg Hx   . Esophageal cancer Neg Hx   . Rectal cancer Neg Hx   . Stomach cancer Neg Hx   . Breast cancer Neg Hx     Medications- reviewed and updated Current Outpatient Medications  Medication Sig Dispense Refill  . losartan (COZAAR) 25 MG tablet Take 1 tablet (25 mg total) by mouth daily. 90 tablet 3  . rosuvastatin (CRESTOR) 20 MG tablet TAKE 1 TABLET BY MOUTH EVERY DAY 90 tablet 2  . sildenafil (VIAGRA) 100 MG tablet Take by mouth.    . traZODone (DESYREL) 50 MG tablet Take 0.5-1 tablets (25-50 mg total) by mouth at bedtime as needed for sleep. 90 tablet 1  . traMADol (ULTRAM) 50 MG tablet Take 1-2 tablets (50-100  mg total) by mouth every 6 (six) hours as needed for moderate pain or severe pain. (Patient not taking: Reported on 11/21/2020) 20 tablet 0   No current facility-administered medications for this visit.    Allergies-reviewed and updated No Known Allergies  Social History   Social History Narrative   Married 1979. 2 kids. No grandkids.       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  Oropharynx normal Neck: no thyromegaly CV: RRR no murmurs rubs or gallops Lungs: CTAB no crackles, wheeze, rhonchi Abdomen: soft/nontender/nondistended/normal bowel sounds. No rebound or guarding.  Ext: no edema Skin: warm, dry Neuro: grossly normal, moves all extremities, PERRLA    Assessment and Plan  64 y.o. male presenting for annual physical.  Health Maintenance counseling: 1. Anticipatory guidance: Patient counseled regarding regular dental exams q6 months, eye exams yearly,  avoiding smoking and second hand smoke, limiting alcohol to 2 beverages per day.   2. Risk factor reduction:  Advised patient of need for regular exercise and diet rich and fruits and vegetables to reduce risk of heart attack and stroke. Exercise- 45 mins walking 3 days a week despite multiple barriers. Diet-up 9 lbs but multiple barriers to maintaining weight this year- very hard year. plns to reduce portion sizes Wt Readings from Last 3 Encounters:  11/21/20 187 lb (84.8 kg)  07/28/20 183 lb 3 oz (83.1 kg)  07/16/20 183 lb 3 oz (83.1 kg)  3. Immunizations/screenings/ancillary studies- flu shot today  Immunization History  Administered Date(s) Administered  . Influenza Split 10/25/2011, 11/03/2012  . Influenza Whole 10/08/2008, 09/23/2009, 09/08/2010  . Influenza,inj,Quad PF,6+ Mos 11/09/2013, 10/14/2014, 08/22/2015, 09/16/2017, 10/27/2018, 10/08/2019  . Influenza-Unspecified 09/14/2016  . PFIZER SARS-COV-2 Vaccination 03/05/2020, 03/24/2020, 10/07/2020  . Td 02/17/2005  . Tdap 06/09/2015  . Zoster Recombinat (Shingrix) 07/13/2017, 09/16/2017  4. Prostate cancer history-  recovered well after prostate cancer surgery. PSA has been 0. Continence ok. Struggling with erections still.   Lab Results  Component Value Date   PSA <0.04 11/12/2020   PSA 6.53 (H) 10/29/2019   PSA 5.33 11/13/2018   5. Colon cancer screening - 05/16/18 with 5 year repeat due topolyp history 6. Skin cancer screening- dermatology  yearly. advised regular sunscreen use. Denies worrisome, changing, or new skin lesions.  7. Never smoker 8. STD screening - only active with wife. Hopeful for more after surgery.   Status of chronic or acute concerns   #social update- looking at retiring in next year. Has been more stressful than he has thought- a lot of self worth related to work. 10 acre place in [place] that he is going to work on. Mom and mother in law passed away 23-Mar-2020 plus sugery plus retiring, moving to [employer].  #hypertension S: medication: Losartan 25Mg  -occasionally up into 140s BP Readings from Last 3 Encounters:  11/21/20 122/78  07/29/20 119/67  07/16/20 (!) 138/77  A/P: Stable. Continue current medications.  -checks blood pressure yearly   #hyperlipidemia/myalgias S: Medication: Rosuvastatin 20Mg  every day . More myalgias on legs  Coronary calcium score of 158 November 2019.  Lab Results  Component Value Date   CHOL 144 11/12/2020   HDL 53 11/12/2020   LDLCALC 72 11/12/2020   LDLDIRECT 150.3 11/02/2013   TRIG 104 11/12/2020   CHOLHDL 2.7 11/12/2020   A/P: Patient with some myalgias on rosuvastatin 20 mg daily.  LDL very close to goal of 70 or less at 72.  We opted to take 2 weeks off of medication and then cut in half and trial 10 mg daily.  I asked him to journal and see how his symptoms do during this transition period.  He will come back in 3 months for repeat labs to reassess.  He also has follow-up with Dr. Ellyn Hack next year and we can get his opinion  # Hyperglycemia/insulin resistance/prediabetes - mom had diabetes S:  Medication:  none Exercise and diet- has slipped up some Lab Results  Component Value Date   HGBA1C 5.5 11/12/2020   HGBA1C 5.6 10/29/2019   HGBA1C 5.5 10/03/2018  A/P: prefers to continue to check a1c- cbgs high in past- thankfully normal this year  # insomnia S:Medication: Trazodone 50Mg  PRN   Usually half tablet A/P: reasonable control- continue current  medicines   Sildenafil 50mg  trial- flushed, red face, lightheaded- may want to retrial. From Dr. Alinda Money. May try lower dose next time.   Recommended follow up: No follow-ups on file. Future Appointments  Date Time Provider [place]  02/25/2021 10:20 AM Leonie Man, MD [facility]   Lab/Order associations: Patient had lab drawn on 11/12/2020   ICD-10-CM   1. Preventative health care  Z00.00   2. Essential hypertension  I10   3. Mixed hyperlipidemia  E78.2   4. Hyperglycemia  R73.9   5. Anxiety  F41.9     No orders of the defined types were placed in this encounter.   Return precautions advised.  Garret Reddish, MD

## 2020-11-21 ENCOUNTER — Ambulatory Visit (INDEPENDENT_AMBULATORY_CARE_PROVIDER_SITE_OTHER): Payer: No Typology Code available for payment source | Admitting: Family Medicine

## 2020-11-21 ENCOUNTER — Encounter: Payer: Self-pay | Admitting: Family Medicine

## 2020-11-21 ENCOUNTER — Other Ambulatory Visit: Payer: Self-pay

## 2020-11-21 VITALS — BP 122/78 | HR 70 | Temp 98.1°F | Resp 18 | Ht 68.0 in | Wt 187.0 lb

## 2020-11-21 DIAGNOSIS — Z0000 Encounter for general adult medical examination without abnormal findings: Secondary | ICD-10-CM | POA: Diagnosis not present

## 2020-11-21 DIAGNOSIS — R739 Hyperglycemia, unspecified: Secondary | ICD-10-CM

## 2020-11-21 DIAGNOSIS — F419 Anxiety disorder, unspecified: Secondary | ICD-10-CM

## 2020-11-21 DIAGNOSIS — E782 Mixed hyperlipidemia: Secondary | ICD-10-CM

## 2020-11-21 DIAGNOSIS — I10 Essential (primary) hypertension: Secondary | ICD-10-CM | POA: Diagnosis not present

## 2020-11-21 DIAGNOSIS — Z23 Encounter for immunization: Secondary | ICD-10-CM | POA: Diagnosis not present

## 2020-12-17 ENCOUNTER — Encounter: Payer: Self-pay | Admitting: Family Medicine

## 2021-01-27 ENCOUNTER — Encounter: Payer: Self-pay | Admitting: Family Medicine

## 2021-02-10 ENCOUNTER — Other Ambulatory Visit (INDEPENDENT_AMBULATORY_CARE_PROVIDER_SITE_OTHER): Payer: No Typology Code available for payment source

## 2021-02-10 ENCOUNTER — Other Ambulatory Visit: Payer: Self-pay

## 2021-02-10 DIAGNOSIS — E782 Mixed hyperlipidemia: Secondary | ICD-10-CM | POA: Diagnosis not present

## 2021-02-10 LAB — LDL CHOLESTEROL, DIRECT: Direct LDL: 74 mg/dL

## 2021-02-10 NOTE — Addendum Note (Signed)
Addended by: Gus Rankin on: 02/10/2021 08:46 AM   Modules accepted: Orders

## 2021-02-13 ENCOUNTER — Other Ambulatory Visit: Payer: Self-pay

## 2021-02-13 MED ORDER — LOSARTAN POTASSIUM 25 MG PO TABS
25.0000 mg | ORAL_TABLET | Freq: Every day | ORAL | 0 refills | Status: DC
Start: 2021-02-13 — End: 2021-05-08

## 2021-02-18 ENCOUNTER — Other Ambulatory Visit: Payer: No Typology Code available for payment source

## 2021-02-25 ENCOUNTER — Other Ambulatory Visit: Payer: Self-pay

## 2021-02-25 ENCOUNTER — Encounter: Payer: Self-pay | Admitting: Cardiology

## 2021-02-25 ENCOUNTER — Ambulatory Visit (INDEPENDENT_AMBULATORY_CARE_PROVIDER_SITE_OTHER): Payer: No Typology Code available for payment source | Admitting: Cardiology

## 2021-02-25 VITALS — BP 140/80 | HR 59 | Ht 68.0 in | Wt 188.0 lb

## 2021-02-25 DIAGNOSIS — I2510 Atherosclerotic heart disease of native coronary artery without angina pectoris: Secondary | ICD-10-CM | POA: Diagnosis not present

## 2021-02-25 DIAGNOSIS — R739 Hyperglycemia, unspecified: Secondary | ICD-10-CM

## 2021-02-25 DIAGNOSIS — I10 Essential (primary) hypertension: Secondary | ICD-10-CM

## 2021-02-25 DIAGNOSIS — E785 Hyperlipidemia, unspecified: Secondary | ICD-10-CM

## 2021-02-25 NOTE — Patient Instructions (Signed)
Medication Instructions:   No changes *If you need a refill on your cardiac medications before your next appointment, please call your pharmacy*   Lab Work: Not needed     Testing/Procedures: Not needed   Follow-Up: At [facility], you and your health needs are our priority.  As part of our continuing mission to provide you with exceptional heart care, we have created designated Provider Care Teams.  These Care Teams include your primary Cardiologist (physician) and Advanced Practice Providers (APPs -  Physician Assistants and Nurse Practitioners) who all work together to provide you with the care you need, when you need it.     Your next appointment:   12 month(s)  The format for your next appointment:   In Person  Provider:   Glenetta Hew, MD   Other Instructions  continue to monitor your blood pressure  - range Dr Ellyn Hack  would like it to be  Is 120 - 135 /80

## 2021-02-25 NOTE — Progress Notes (Signed)
Primary Care Provider: Marin Olp, MD Cardiologist: No primary care provider on file. Electrophysiologist: None  Clinic Note: Chief Complaint  Patient presents with  . Follow-up    Coronary artery calcification, HTN, HLD   ===================================  ASSESSMENT/PLAN   Problem List Items Addressed This Visit    Hyperglycemia    Stable A1c of 5.5 back in November 2021.      Hyperlipidemia LDL goal <100 (Chronic)    He is not taking rosuvastatin every day, but is taking half a tablet because of myalgias.    Lab Results  Component Value Date   LDLCALC 72 11/12/2020   LDLDIRECT 74 02/10/2021   -> Labs are being followed by PCP.  If not able to meet goal because of reduced dose of rosuvastatin, would probably consider adding Zetia 10 mg daily.        Essential hypertension (Chronic)    Blood pressure is 140/80.  Goal is 120-135/60-80 mmHg.  He says at home this is the target he is in.  He remains on 25 mg losartan.  Low threshold to titrate further. Recommend that he monitor his blood pressures at home, and report back in when he sees Dr. Yong Channel or myself..      Relevant Orders   EKG 12-Lead (Completed)   Coronary artery calcification seen on CAT scan - Primary (Chronic)    Low to intermediate risk coronary calcium score.  No active angina symptoms. I did encourage him to get back and exercising.     Plan: Continue with risk factor modification  BP control with losartan, needs better control.  He says at home blood pressures 120s to 150s.  May need to increase dose.  Lipid control: He is on reduced dose of rosuvastatin.  Need to follow closely.  Currently at goal.      Relevant Orders   EKG 12-Lead (Completed)      ===================================  HPI:    Patrick [name] is a 65 y.o. male with Cardiac Risk Factors notable for HTN and HLD, and Family History of CAD with mildly elevated Coronary Calcium Score who presents today for  annual follow-up.  Patrick [name] was last seen on February 20, 2020 to follow-up evaluation of Coronary Artery Calcification noted on CT => November 2019 Coronary Calcium Score 158.  Mild three-vessel calcification  Recent Hospitalizations:  07/28/2020: Robotic Laparoscopic Radical Prostatectomy  Reviewed  CV studies:    The following studies were reviewed today: (if available, images/films reviewed: From Epic Chart or Care Everywhere) . None:  Interval History:   Patrick [name] returns here today for annual follow-up doing pretty well.  He did fine with his prostate surgery.  He does note that he is been less active of late and therefore has a little bit of exertional dyspnea from deconditioning.  Part of this is related to him having the prostate surgery as well as hand surgery (right hand due to thumb injury).  His mother-in-law also recently died, and they have been dealing with all the burial details etc.  His wife has been under a lot of stress.  He also indicates that he possibly had COVID back in February.  His wife tested positive and had 3 to 4 days of symptoms.  He himself had about a day or 2 of feeling unwell but never got tested.  He does walk some and is try to get back to more routine exercise, with doing so, he denies any exertional dyspnea or  chest pain.  He is nowhere near back to the 3 miles that he was walking most days before.  He also is not going to the gym as routinely.  Once everything settles down with all the social issues, and the weather improves, he preop hopes to get back to his walking and exercise.  CV Review of Symptoms (Summary): positive for - dyspnea on exertion, palpitations and Mostly deconditioning related dyspnea.  He also may still be getting over a mild case of Covid.  Very rare skipped beats/palpitations but nothing that makes him worry. negative for - chest pain, edema, orthopnea, paroxysmal nocturnal dyspnea, rapid heart rate, shortness of breath or  Lightheadedness, dizziness or wooziness.  Syncope/near syncope, TIA/amaurosis fugax, claudication  The patient does not have symptoms concerning for COVID-19 infection (fever, chills, cough, or new shortness of breath).   REVIEWED OF SYSTEMS   Review of Systems  Constitutional: Negative for malaise/fatigue (Has gotten little bit more sedentary and therefore is a little more fatigued with exercise, but no real significant fatigue) and weight loss (He gained back some weight.).  HENT: Negative for congestion and sinus pain.   Respiratory: Negative for cough and shortness of breath.   Gastrointestinal: Negative for blood in stool and melena.  Genitourinary: Positive for frequency (He now has spells where he feels like he has leaking). Negative for hematuria.  Musculoskeletal: Negative for falls and joint pain.  Neurological: Negative for dizziness, seizures and weakness.  Psychiatric/Behavioral: Negative for depression and memory loss. The patient does not have insomnia.    I have reviewed and (if needed) personally updated the patient's problem list, medications, allergies, past medical and surgical history, social and family history.   PAST MEDICAL HISTORY   Past Medical History:  Diagnosis Date  . Cancer [facility])    prostate CA- bx done 11-18, hasn't started treatment  . Coronary artery calcification seen on CAT scan    Coronary Calcium Score (November 13, 2018)- 158.  Mild Calcium in all 3 Epicardial coronary arteries.  Slight TA dilation ~4 cm.   . Hyperlipidemia    Rosuvastatin just ordered, but not yet started.  . Hypertension   . NEPHROLITHIASIS, HX OF 02/28/2008   Qualifier: Diagnosis of  By: [name] MD, [name] Prostate cancer [facility])     PAST SURGICAL HISTORY   Past Surgical History:  Procedure Laterality Date  . A/C repair right  years ago   HS football injury  . COLONOSCOPY    . KNEE ARTHROSCOPY  11/15/2012   meniscus repair- Dr. [name], right  . LYMPHADENECTOMY  Bilateral 07/28/2020   Procedure: LYMPHADENECTOMY, PELVIC;  Surgeon: [name], MD;  Location: [facility];  Service: Urology;  Laterality: Bilateral;  . PROSTATE BIOPSY     November 2018  . ROBOT ASSISTED LAPAROSCOPIC RADICAL PROSTATECTOMY N/A 07/28/2020   Procedure: XI ROBOTIC ASSISTED LAPAROSCOPIC RADICAL PROSTATECTOMY LEVEL 2;  Surgeon: [name], MD;  Location: [facility];  Service: Urology;  Laterality: N/A;    Immunization History  Administered Date(s) Administered  . Influenza Split 10/25/2011, 11/03/2012  . Influenza Whole 10/08/2008, 09/23/2009, 09/08/2010  . Influenza,inj,Quad PF,6+ Mos 11/09/2013, 10/14/2014, 08/22/2015, 09/16/2017, 10/27/2018, 10/08/2019, 11/21/2020  . Influenza-Unspecified 09/14/2016  . PFIZER(Purple Top)SARS-COV-2 Vaccination 03/06/2020, 03/27/2020, 10/07/2020  . Td 02/17/2005  . Tdap 06/09/2015  . Zoster Recombinat (Shingrix) 07/13/2017, 09/16/2017    MEDICATIONS/ALLERGIES   Current Meds  Medication Sig  . losartan (COZAAR) 25 MG tablet Take 1 tablet (25 mg total) by mouth daily.  Marland Kitchen  rosuvastatin (CRESTOR) 20 MG tablet TAKE 1 TABLET BY MOUTH EVERY DAY (Patient taking differently: 10 mg. Taking 1/2 tablet once daily)  . sildenafil (VIAGRA) 100 MG tablet Take by mouth.  . traZODone (DESYREL) 50 MG tablet Take 0.5-1 tablets (25-50 mg total) by mouth at bedtime as needed for sleep.    No Known Allergies  SOCIAL HISTORY/FAMILY HISTORY   Reviewed in Epic:  Pertinent findings:  Social History   Tobacco Use  . Smoking status: Never Smoker  . Smokeless tobacco: Never Used  Vaping Use  . Vaping Use: Never used  Substance Use Topics  . Alcohol use: Yes    Comment: occasional  . Drug use: No   Social History   Social History Narrative   Married 1979. 2 kids. No grandkids.       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  Constitutional:      General: He is not in acute distress.    Appearance: Normal appearance. He is normal weight. He is not ill-appearing ( Healthy-appearing.) or toxic-appearing.     Comments: Well-groomed. Well-nourished  HENT:     Head: Normocephalic and atraumatic.  Neck:     Vascular: No carotid bruit, hepatojugular reflux or JVD.  Cardiovascular:     Rate and Rhythm: Normal rate and regular rhythm.  No extrasystoles are present.    Chest Wall: PMI is not displaced.     Pulses: Normal pulses.     Heart sounds: Normal heart sounds, S1 normal and S2 normal. Heart sounds not distant. No murmur heard. No friction rub. No gallop.   Pulmonary:     Effort: Pulmonary effort is normal. No respiratory distress.     Breath sounds: Normal breath sounds.  Chest:     Chest wall: No tenderness.  Musculoskeletal:        General: No swelling. Normal range of motion.     Cervical back: Normal range of motion and neck supple.  Skin:    General: Skin is warm and dry.  Neurological:     General: No focal deficit present.     Mental Status: He is alert and oriented to person, place, and time. Mental status is at baseline.     Gait: Gait normal.  Psychiatric:        Mood and Affect: Mood normal.        Behavior: Behavior normal.        Thought Content: Thought content normal.        Judgment: Judgment normal.     Adult ECG Report  Rate: 59 ;  Rhythm: sinus bradycardia and Poor R wave progression in the precordial leads, and borderline Q waves in inferior leads.  Cannot exclude inferior MI and anterior MI, age-indeterminate.  Otherwise normal axis, intervals and durations.;   Narrative Interpretation: Stable  EKG.  Recent Labs: Reviewed Lab Results  Component Value Date   CHOL 144 11/12/2020   HDL 53 11/12/2020   LDLCALC 72 11/12/2020  LDLDIRECT 74.0 02/10/2021   TRIG 104 11/12/2020   CHOLHDL 2.7 11/12/2020   Lab Results  Component Value Date   CREATININE 0.81 11/12/2020   BUN 13 11/12/2020   NA 138 11/12/2020   K 4.3 11/12/2020   CL 104 11/12/2020   CO2 26 11/12/2020   CBC Latest Ref Rng & Units 11/12/2020 07/29/2020 07/29/2020  WBC 3.8 - 10.8 Thousand/uL 4.8 - -  Hemoglobin 13.2 - 17.1 g/dL 15.2 13.0 11.4(L)  Hematocrit 38.5 - 50.0 % 43.8 38.7(L) 34.4(L)  Platelets 140 - 400 Thousand/uL 195 - -    Lab Results  Component Value Date   TSH 2.52 06/08/2016    ==================================================  COVID-19 Education: The signs and symptoms of COVID-19 were discussed with the patient and how to seek care for testing (follow up with PCP or arrange E-visit).   The importance of social distancing and COVID-19 vaccination was discussed today. The patient is practicing social distancing & Masking.   I spent a total of 71minutes with the patient spent in direct patient consultation.  Additional time spent with chart review  / charting (studies, outside notes, etc): 11 min Total Time: 31 min   Current medicines are reviewed at length with the patient today.  (+/- concerns) none  This visit occurred during the SARS-CoV-2 public health emergency.  Safety protocols were in place, including screening questions prior to the visit, additional usage of staff PPE, and extensive cleaning of exam room while observing appropriate contact time as indicated for disinfecting solutions.  Notice: This dictation was prepared with Dragon dictation along with smaller phrase technology. Any transcriptional errors that result from this process are unintentional and may not be corrected upon review.  Patient Instructions / Medication Changes & Studies & Tests Ordered   Patient  Instructions  Medication Instructions:   No changes *If you need a refill on your cardiac medications before your next appointment, please call your pharmacy*   Lab Work: Not needed     Testing/Procedures: Not needed   Follow-Up: At [facility], you and your health needs are our priority.  As part of our continuing mission to provide you with exceptional heart care, we have created designated Provider Care Teams.  These Care Teams include your primary Cardiologist (physician) and Advanced Practice Providers (APPs -  Physician Assistants and Nurse Practitioners) who all work together to provide you with the care you need, when you need it.     Your next appointment:   12 month(s)  The format for your next appointment:   In Person  Provider:   Glenetta Hew, MD   Other Instructions  continue to monitor your blood pressure  - range Dr Ellyn Hack  would like it to be  Is 120 - 135 /80    Studies Ordered:   Orders Placed This Encounter  Procedures  . EKG 12-Lead     Glenetta Hew, M.D., M.S. Interventional Cardiologist   Pager # [phone] Phone # [phone] [address]. [place], [place] [address]   Thank you for choosing Heartcare at [facility]!!

## 2021-03-21 ENCOUNTER — Encounter: Payer: Self-pay | Admitting: Family Medicine

## 2021-03-23 ENCOUNTER — Encounter: Payer: Self-pay | Admitting: Cardiology

## 2021-03-23 NOTE — Assessment & Plan Note (Signed)
He is not taking rosuvastatin every day, but is taking half a tablet because of myalgias.    Lab Results  Component Value Date   LDLCALC 72 11/12/2020   LDLDIRECT 74 02/10/2021   -> Labs are being followed by PCP.  If not able to meet goal because of reduced dose of rosuvastatin, would probably consider adding Zetia 10 mg daily.

## 2021-03-23 NOTE — Assessment & Plan Note (Signed)
Stable A1c of 5.5 back in November 2021.

## 2021-03-23 NOTE — Assessment & Plan Note (Addendum)
Low to intermediate risk coronary calcium score.  No active angina symptoms. I did encourage him to get back and exercising.     Plan: Continue with risk factor modification  BP control with losartan, needs better control.  He says at home blood pressures 120s to 150s.  May need to increase dose.  Lipid control: He is on reduced dose of rosuvastatin.  Need to follow closely.  Currently at goal.

## 2021-03-23 NOTE — Assessment & Plan Note (Signed)
Blood pressure is 140/80.  Goal is 120-135/60-80 mmHg.  He says at home this is the target he is in.  He remains on 25 mg losartan.  Low threshold to titrate further. Recommend that he monitor his blood pressures at home, and report back in when he sees Dr. Yong Channel or myself.Marland Kitchen

## 2021-05-08 ENCOUNTER — Encounter: Payer: Self-pay | Admitting: Family Medicine

## 2021-05-08 ENCOUNTER — Other Ambulatory Visit: Payer: Self-pay

## 2021-05-08 MED ORDER — LOSARTAN POTASSIUM 25 MG PO TABS
25.0000 mg | ORAL_TABLET | Freq: Every day | ORAL | 0 refills | Status: DC
Start: 2021-05-08 — End: 2021-08-11

## 2021-05-08 MED ORDER — TRAZODONE HCL 50 MG PO TABS
25.0000 mg | ORAL_TABLET | Freq: Every evening | ORAL | 1 refills | Status: DC | PRN
Start: 2021-05-08 — End: 2021-11-09

## 2021-06-25 ENCOUNTER — Other Ambulatory Visit: Payer: Self-pay | Admitting: Family Medicine

## 2021-07-28 ENCOUNTER — Encounter: Payer: Self-pay | Admitting: Medical Oncology

## 2021-08-11 ENCOUNTER — Other Ambulatory Visit: Payer: Self-pay | Admitting: Cardiology

## 2021-10-28 ENCOUNTER — Telehealth: Payer: Self-pay

## 2021-10-28 DIAGNOSIS — E785 Hyperlipidemia, unspecified: Secondary | ICD-10-CM

## 2021-10-28 DIAGNOSIS — I10 Essential (primary) hypertension: Secondary | ICD-10-CM

## 2021-10-28 DIAGNOSIS — E782 Mixed hyperlipidemia: Secondary | ICD-10-CM

## 2021-10-28 DIAGNOSIS — Z0000 Encounter for general adult medical examination without abnormal findings: Secondary | ICD-10-CM

## 2021-10-28 DIAGNOSIS — R739 Hyperglycemia, unspecified: Secondary | ICD-10-CM

## 2021-10-28 DIAGNOSIS — C61 Malignant neoplasm of prostate: Secondary | ICD-10-CM

## 2021-10-28 NOTE — Telephone Encounter (Signed)
Future labs ordered, notification of lab appointment for 12/24/20 at 8:30am sent via mychart.

## 2021-10-28 NOTE — Telephone Encounter (Signed)
Patient called stating he has a CPE scheduled 12/6.  Patient is requesting labs to be entered to be completed early on the day of 12/5.  If approved, patient would like to be schedule and notified via mychart.    States he is hard to reach by phone.

## 2021-11-07 ENCOUNTER — Other Ambulatory Visit: Payer: Self-pay | Admitting: Family Medicine

## 2021-11-08 ENCOUNTER — Other Ambulatory Visit: Payer: Self-pay | Admitting: Cardiology

## 2021-11-19 NOTE — Progress Notes (Signed)
Phone: [phone]   Subjective:  Patient presents today for their annual physical. Chief complaint-noted.   See problem oriented charting- ROS- full  review of systems was completed and negative  per full ROS sheet  The following were reviewed and entered/updated in epic: Past Medical History:  Diagnosis Date   Cancer ([place])    prostate CA- bx done 11-18, hasn't started treatment   Coronary artery calcification seen on CAT scan    Coronary Calcium Score (November 13, 2018)- 158.  Mild Calcium in all 3 Epicardial coronary arteries.  Slight TA dilation ~4 cm.    Hyperlipidemia    Rosuvastatin just ordered, but not yet started.   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   Qualifier: Diagnosis of  By: [name] MD, [name]    Prostate cancer [facility])    Patient Active Problem List   Diagnosis Date Noted   History of prostate cancer 05/25/2018    Priority: High   Coronary artery calcification seen on CAT scan     Priority: Medium    Hyperglycemia 05/17/2017    Priority: Medium    History of skin cancer 06/15/2016    Priority: Medium    Essential hypertension 06/09/2015    Priority: Medium    Hyperlipidemia LDL goal <100 02/28/2008    Priority: Medium    History of adenomatous polyp of colon 10/27/2018    Priority: Low   Rupture of UCL of right thumb 02/25/2020   Anxiety 10/27/2018   Rotator cuff syndrome of left shoulder 03/16/2016   Acromioclavicular joint arthritis 03/16/2016   Anterior tibialis tendinitis of left leg 01/02/2015   Past Surgical History:  Procedure Laterality Date   A/C repair right  years ago   HS football injury   COLONOSCOPY     KNEE ARTHROSCOPY  11/15/2012   meniscus repair- Dr. [name], right   LYMPHADENECTOMY Bilateral 07/28/2020   Procedure: LYMPHADENECTOMY, PELVIC;  Surgeon: [name], MD;  Location: [facility];  Service: Urology;  Laterality: Bilateral;   PROSTATE BIOPSY     November 2018   ROBOT ASSISTED LAPAROSCOPIC RADICAL  PROSTATECTOMY N/A 07/28/2020   Procedure: XI ROBOTIC ASSISTED LAPAROSCOPIC RADICAL PROSTATECTOMY LEVEL 2;  Surgeon: [name], MD;  Location: [facility];  Service: Urology;  Laterality: N/A;    Family History  Problem Relation Age of Onset   Heart disease Father        23s heavy smoker   Prostate cancer Father        81s or 5s   Hyperlipidemia Father    Diabetes Mother    Cancer Mother 71       lung cancer-nonsmoker but 2nd hand smoke   Lung cancer Mother        died at 19   Heart disease Brother    Colon cancer Neg Hx    Esophageal cancer Neg Hx    Rectal cancer Neg Hx    Stomach cancer Neg Hx    Breast cancer Neg Hx     Medications- reviewed and updated Current Outpatient Medications  Medication Sig Dispense Refill   sildenafil (VIAGRA) 100 MG tablet Take by mouth.     losartan (COZAAR) 25 MG tablet Take 1 tablet (25 mg total) by mouth daily. 90 tablet 3   rosuvastatin (CRESTOR) 20 MG tablet Take 1 tablet (20 mg total) by mouth daily. 90 tablet 3   traZODone (DESYREL) 50 MG tablet Take 0.5-1 tablets (25-50 mg total) by mouth at bedtime as needed for sleep.  90 tablet 3   No current facility-administered medications for this visit.    Allergies-reviewed and updated No Known Allergies  Social History   Social History Narrative   Married 1979. 2 kids- son in [place] and daughter in [place] with 2 grandkids age 45 and 39 in 2022.       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 Oropharynx normal Neck: no thyromegaly CV: RRR no murmurs rubs or gallops Lungs: CTAB no crackles, wheeze, rhonchi Abdomen:  soft/nontender/nondistended/normal bowel sounds. No rebound or guarding.  Ext: no edema Skin: warm, dry Neuro: grossly normal, moves all extremities, PERRLA   Assessment and Plan  65 y.o. male presenting for annual physical.  Health Maintenance counseling: 1. Anticipatory guidance: Patient counseled regarding regular dental exams -q6 months, eye exams -yearly,  avoiding smoking and second hand smoke, limiting alcohol to 2 beverages per day, no illicit drug use.  2. Risk factor reduction:  Advised patient of need for regular exercise and diet rich and fruits and vegetables to reduce risk of heart attack and stroke. Exercise- 45 min walking for 3 days per week last year- extremely active with his projects in [place]. Diet-planned to reduce portion sizes last year- congratulated on 3 lbs down  Wt Readings from Last 3 Encounters:  11/24/21 184 lb (83.5 kg)  02/25/21 188 lb (85.3 kg)  11/21/20 187 lb (84.8 kg)  3. Immunizations/screenings/ancillary studies- discuss Prevnar 20-I would simply wait until age 33 and Omicron/Bivalent booster-had bivalent booster at [facility] within a few months otherwise up-to-date. Immunization History  Administered Date(s) Administered   Influenza Split 10/25/2011, 11/03/2012   Influenza Whole 10/08/2008, 09/23/2009, 09/08/2010   Influenza,inj,Quad PF,6+ Mos 11/09/2013, 10/14/2014, 08/22/2015, 09/16/2017, 10/27/2018, 10/08/2019, 11/21/2020, 10/28/2021   Influenza-Unspecified 09/14/2016   PFIZER(Purple Top)SARS-COV-2 Vaccination 03/06/2020, 03/27/2020, 10/07/2020   Td 02/17/2005   Tdap 06/09/2015   Zoster Recombinat (Shingrix) 07/13/2017, 09/16/2017  4. Prostate cancer screening- recovered well after prostate cancer surgery in the past .   Feels doing great overall since surgery last august 2021.  Lab Results  Component Value Date   PSA 0.00 Repeated and verified X2. (L) 11/23/2021   PSA <0.04 11/12/2020   PSA 6.53 (H) 10/29/2019   5. Colon cancer screening -  last colonoscopy 05/16/2018 with a 5-year recommended due to polyp history 6. Skin cancer screening- follows with dermatology yearly. Advised regular sunscreen use. Denies worrisome, changing, or new skin lesions.  7. Smoking associated screening (lung cancer screening, AAA screen 65-75, UA)- never smoker 8. STD screening - only active with wife  Status of chronic or acute concerns   # social update- was considering retiring last year- has 10 acres in [place] can invest time in (a lot of projects! 14 apple trees, blueberry bushes, keeping bees)- has moved there. 2nd home in [place].   #hypertension S: medication: Losartan 25 mg daily BP Readings from Last 3 Encounters:  11/24/21 128/74  02/25/21 140/80  11/21/20 122/78  A/P: Controlled. Continue current medications.   #hyperlipidemia/myalgias S: Medication:rosuvastatin 20 mg daily - more myalgias on legs- did half dose  and that has bene helpful for side effects  - last visit we opted to trial off for 2 weeks and restart half tablet (10 mg) daily- he has maintained this - Coronary calcium score of 158 November 2019 Lab Results  Component Value Date   CHOL 148 11/23/2021   HDL 53.00 11/23/2021   LDLCALC 78 11/23/2021   LDLDIRECT 74.0 02/10/2021   TRIG 89.0 11/23/2021   CHOLHDL 3 11/23/2021   A/P: With positive coronary calcium scoring 130 in the past we discussed LDL goal of 70 or less-he will work towards this with diet/exercise-higher dose of statin was not tolerable-continue current medication for now  # Hyperglycemia/insulin resistance/prediabetes - mother had diabetes  S:  Medication: none at present Exercise and diet- see above Lab Results  Component Value Date   HGBA1C 5.7 11/23/2021   HGBA1C 5.5 11/12/2020   HGBA1C 5.6 10/29/2019  A/P:  a1c in prediabetes range/slightly worse but within margin of test- continue to work on lifestyle- do not focus on meds at this time - think adding walking back in would be  beneficial  # Insomnia  S:medication: trazodone 50 mg PRN -  half a tablet A/P: Reasonable control-continue current medication - may try off since he is retired and see how he does  #ED- patient is compliant with sildenafil 100 mg - flushed, red face, lightheaded in the past on 50 mg from Dr. Alinda Money.  Recommended follow up: Return in about 1 year (around 11/24/2022) for physical or sooner if needed.  Lab/Order associations: already had labs   ICD-10-CM   1. Preventative health care  Z00.00     2. Hyperlipidemia LDL goal <100  E78.5     3. Essential hypertension  I10     4. Hyperglycemia  R73.9     5. History of prostate cancer  Z85.46       Meds ordered this encounter  Medications   losartan (COZAAR) 25 MG tablet    Sig: Take 1 tablet (25 mg total) by mouth daily.    Dispense:  90 tablet    Refill:  3   rosuvastatin (CRESTOR) 20 MG tablet    Sig: Take 1 tablet (20 mg total) by mouth daily.    Dispense:  90 tablet    Refill:  3   traZODone (DESYREL) 50 MG tablet    Sig: Take 0.5-1 tablets (25-50 mg total) by mouth at bedtime as needed for sleep.    Dispense:  90 tablet    Refill:  3   I,Harris Phan,acting as a scribe for Garret Reddish, MD.,have documented all relevant documentation on the behalf of Garret Reddish, MD,as directed by  Garret Reddish, MD while in the presence of Garret Reddish, MD.  I, Garret Reddish, MD, have reviewed all documentation for this visit. The documentation on 11/24/21 for the exam, diagnosis, procedures, and orders are all accurate and complete.  Return precautions advised.  Garret Reddish, MD

## 2021-11-19 NOTE — Patient Instructions (Addendum)
Health Maintenance Due  Topic Date Due   COVID-19 Vaccine (4 - Booster for [employer] series) Team log bivalent booster from [facility] 09/30/21 12/02/2020   Thrilled you are doing so well!   Lets restart the walking program- see if that can help with prediabetes.   Recommended follow up: Return in about 1 year (around 11/24/2022) for physical or sooner if needed.

## 2021-11-23 ENCOUNTER — Other Ambulatory Visit: Payer: Self-pay

## 2021-11-23 ENCOUNTER — Other Ambulatory Visit (INDEPENDENT_AMBULATORY_CARE_PROVIDER_SITE_OTHER): Payer: No Typology Code available for payment source

## 2021-11-23 DIAGNOSIS — Z0000 Encounter for general adult medical examination without abnormal findings: Secondary | ICD-10-CM | POA: Diagnosis not present

## 2021-11-23 DIAGNOSIS — C61 Malignant neoplasm of prostate: Secondary | ICD-10-CM

## 2021-11-23 DIAGNOSIS — I10 Essential (primary) hypertension: Secondary | ICD-10-CM

## 2021-11-23 DIAGNOSIS — E785 Hyperlipidemia, unspecified: Secondary | ICD-10-CM | POA: Diagnosis not present

## 2021-11-23 DIAGNOSIS — R739 Hyperglycemia, unspecified: Secondary | ICD-10-CM | POA: Diagnosis not present

## 2021-11-23 LAB — COMPREHENSIVE METABOLIC PANEL
ALT: 38 U/L (ref 0–53)
AST: 26 U/L (ref 0–37)
Albumin: 4.4 g/dL (ref 3.5–5.2)
Alkaline Phosphatase: 58 U/L (ref 39–117)
BUN: 17 mg/dL (ref 6–23)
CO2: 27 mEq/L (ref 19–32)
Calcium: 9.3 mg/dL (ref 8.4–10.5)
Chloride: 103 mEq/L (ref 96–112)
Creatinine, Ser: 0.82 mg/dL (ref 0.40–1.50)
GFR: 92.63 mL/min (ref >60.00)
Glucose, Bld: 98 mg/dL (ref 70–99)
Potassium: 4.6 mEq/L (ref 3.5–5.1)
Sodium: 138 mEq/L (ref 135–145)
Total Bilirubin: 1 mg/dL (ref 0.2–1.2)
Total Protein: 6.7 g/dL (ref 6.0–8.3)

## 2021-11-23 LAB — LIPID PANEL
Cholesterol: 148 mg/dL (ref 0–200)
HDL: 53 mg/dL (ref >39.00)
LDL Cholesterol: 78 mg/dL (ref 0–99)
NonHDL: 95.48
Total CHOL/HDL Ratio: 3
Triglycerides: 89 mg/dL (ref 0.0–149.0)
VLDL: 17.8 mg/dL (ref 0.0–40.0)

## 2021-11-23 LAB — CBC WITH DIFFERENTIAL/PLATELET
Basophils Absolute: 0 10*3/uL (ref 0.0–0.1)
Basophils Relative: 0.8 % (ref 0.0–3.0)
Eosinophils Absolute: 0.2 10*3/uL (ref 0.0–0.7)
Eosinophils Relative: 4.7 % (ref 0.0–5.0)
HCT: 43.9 % (ref 39.0–52.0)
Hemoglobin: 15 g/dL (ref 13.0–17.0)
Lymphocytes Relative: 22.5 % (ref 12.0–46.0)
Lymphs Abs: 1 10*3/uL (ref 0.7–4.0)
MCHC: 34.2 g/dL (ref 30.0–36.0)
MCV: 90.4 fl (ref 78.0–100.0)
Monocytes Absolute: 0.5 10*3/uL (ref 0.1–1.0)
Monocytes Relative: 11.2 % (ref 3.0–12.0)
Neutro Abs: 2.7 10*3/uL (ref 1.4–7.7)
Neutrophils Relative %: 60.8 % (ref 43.0–77.0)
Platelets: 195 10*3/uL (ref 150.0–400.0)
RBC: 4.86 Mil/uL (ref 4.22–5.81)
RDW: 13 % (ref 11.5–15.5)
WBC: 4.5 10*3/uL (ref 4.0–10.5)

## 2021-11-23 LAB — PSA: PSA: 0 ng/mL — ABNORMAL LOW (ref 0.10–4.00)

## 2021-11-23 LAB — HEMOGLOBIN A1C: Hgb A1c MFr Bld: 5.7 % (ref 4.6–6.5)

## 2021-11-24 ENCOUNTER — Encounter: Payer: Self-pay | Admitting: Family Medicine

## 2021-11-24 ENCOUNTER — Ambulatory Visit (INDEPENDENT_AMBULATORY_CARE_PROVIDER_SITE_OTHER): Payer: No Typology Code available for payment source | Admitting: Family Medicine

## 2021-11-24 VITALS — BP 128/74 | HR 64 | Temp 98.5°F | Ht 68.0 in | Wt 184.0 lb

## 2021-11-24 DIAGNOSIS — I10 Essential (primary) hypertension: Secondary | ICD-10-CM | POA: Diagnosis not present

## 2021-11-24 DIAGNOSIS — Z8546 Personal history of malignant neoplasm of prostate: Secondary | ICD-10-CM

## 2021-11-24 DIAGNOSIS — E785 Hyperlipidemia, unspecified: Secondary | ICD-10-CM | POA: Diagnosis not present

## 2021-11-24 DIAGNOSIS — R739 Hyperglycemia, unspecified: Secondary | ICD-10-CM

## 2021-11-24 DIAGNOSIS — Z0000 Encounter for general adult medical examination without abnormal findings: Secondary | ICD-10-CM

## 2021-11-24 MED ORDER — ROSUVASTATIN CALCIUM 20 MG PO TABS: 20.0000 mg | ORAL_TABLET | Freq: Every day | ORAL | 3 refills | Status: DC

## 2021-11-24 MED ORDER — LOSARTAN POTASSIUM 25 MG PO TABS: 25.0000 mg | ORAL_TABLET | Freq: Every day | ORAL | 3 refills | Status: DC

## 2021-11-24 MED ORDER — TRAZODONE HCL 50 MG PO TABS: 25.0000 mg | ORAL_TABLET | Freq: Every evening | ORAL | 3 refills | Status: DC | PRN

## 2021-12-24 ENCOUNTER — Encounter: Payer: Self-pay | Admitting: Family Medicine

## 2021-12-30 ENCOUNTER — Encounter: Payer: Self-pay | Admitting: Family Medicine

## 2022-01-20 ENCOUNTER — Encounter: Payer: Self-pay | Admitting: Cardiology

## 2022-04-28 ENCOUNTER — Encounter: Payer: Self-pay | Admitting: Cardiology

## 2022-04-28 ENCOUNTER — Ambulatory Visit (INDEPENDENT_AMBULATORY_CARE_PROVIDER_SITE_OTHER): Payer: Medicare Other | Admitting: Cardiology

## 2022-04-28 VITALS — BP 138/82 | HR 60 | Ht 68.0 in | Wt 182.4 lb

## 2022-04-28 DIAGNOSIS — E785 Hyperlipidemia, unspecified: Secondary | ICD-10-CM | POA: Diagnosis not present

## 2022-04-28 DIAGNOSIS — R739 Hyperglycemia, unspecified: Secondary | ICD-10-CM | POA: Diagnosis not present

## 2022-04-28 DIAGNOSIS — I10 Essential (primary) hypertension: Secondary | ICD-10-CM | POA: Diagnosis not present

## 2022-04-28 DIAGNOSIS — I2510 Atherosclerotic heart disease of native coronary artery without angina pectoris: Secondary | ICD-10-CM

## 2022-04-28 NOTE — Patient Instructions (Addendum)
Medication Instructions:  ? ?No changes ? ?*If you need a refill on your cardiac medications before your next appointment, please call your pharmacy* ? ? ?Lab Work: ?Not needed ? ? ? ? ?Testing/Procedures: ? ? To be schedule in Nov or Dec 2023 ?CT coronary calcium score.  ? ?Test locations:  ?[facility] ([address] [place], [place] [address]) ?[facility] ([address] [place], [place] [address])  ? ?This is $99 out of pocket. ? ? ?Coronary CalciumScan ?A coronary calcium scan is an imaging test used to look for deposits of calcium and other fatty materials (plaques) in the inner lining of the blood vessels of the heart (coronary arteries). These deposits of calcium and plaques can partly clog and narrow the coronary arteries without producing any symptoms or warning signs. This puts a person at risk for a heart attack. This test can detect these deposits before symptoms develop. ?Tell a health care provider about: ?Any allergies you have. ?All medicines you are taking, including vitamins, herbs, eye drops, creams, and over-the-counter medicines. ?Any problems you or family members have had with anesthetic medicines. ?Any blood disorders you have. ?Any surgeries you have had. ?Any medical conditions you have. ?Whether you are pregnant or may be pregnant. ?What are the risks? ?Generally, this is a safe procedure. However, problems may occur, including: ?Harm to a pregnant woman and her unborn baby. This test involves the use of radiation. Radiation exposure can be dangerous to a pregnant woman and her unborn baby. If you are pregnant, you generally should not have this procedure done. ?Slight increase in the risk of cancer. This is because of the radiation involved in the test. ?What happens before the procedure? ?No preparation is needed for this procedure. ?What happens during the procedure? ?You will undress and remove any jewelry around your neck or chest. ?You will put on a hospital  gown. ?Sticky electrodes will be placed on your chest. The electrodes will be connected to an electrocardiogram (ECG) machine to record a tracing of the electrical activity of your heart. ?A CT scanner will take pictures of your heart. During this time, you will be asked to lie still and hold your breath for 2-3 seconds while a picture of your heart is being taken. ?The procedure may vary among health care providers and hospitals. ?What happens after the procedure? ?You can get dressed. ?You can return to your normal activities. ?It is up to you to get the results of your test. Ask your health care provider, or the department that is doing the test, when your results will be ready. ?Summary ?A coronary calcium scan is an imaging test used to look for deposits of calcium and other fatty materials (plaques) in the inner lining of the blood vessels of the heart (coronary arteries). ?Generally, this is a safe procedure. Tell your health care provider if you are pregnant or may be pregnant. ?No preparation is needed for this procedure. ?A CT scanner will take pictures of your heart. ?You can return to your normal activities after the scan is done. ?This information is not intended to replace advice given to you by your health care provider. Make sure you discuss any questions you have with your health care provider. ?Document Released: 06/03/2008 Document Revised: 10/25/2016 Document Reviewed: 10/25/2016 ?Elsevier Interactive Patient Education ? 2017 [place].  ? ?Follow-Up: ?At [facility], you and your health needs are our priority.  As part of our continuing mission to provide you with exceptional heart  care, we have created designated Provider Care Teams.  These Care Teams include your primary Cardiologist (physician) and Advanced Practice Providers (APPs -  Physician Assistants and Nurse Practitioners) who all work together to provide you with the care you need, when you need it. ? ?  ? ?Your next  appointment:   ?12 month(s) ? ?The format for your next appointment:   ?In Person ? ?Provider:   ?Glenetta Hew, MD  ? ? ?

## 2022-04-28 NOTE — Progress Notes (Signed)
Primary Care Provider: Marin Olp, MD ->  Recently moved to [facility]-is transitioning care to [facility]. Cardiologist: Glenetta Hew, MD Electrophysiologist: None  Clinic Note: Chief Complaint  Patient presents with   Follow-up    Doing well.  No major complaints.   ===================================  ASSESSMENT/PLAN   Problem List Items Addressed This Visit       Cardiology Problems   Hyperlipidemia LDL goal <100 (Chronic)    Most recent lipids from December 2022 showed LDL of 78.  This is well within target range.  He is taking 1/2 tablet of Crestor 20 mg daily.  Continue to recommend increased exercise and dietary modification.  Should be due to have labs rechecked by the end of the year.       Relevant Medications   tadalafil (CIALIS) 5 MG tablet   Other Relevant Orders   EKG 12-Lead (Completed)   CT CARDIAC SCORING (SELF PAY ONLY)   Essential hypertension (Chronic)    Blood pressure is little high today, but has not been this high at home.  He is currently taking 25 mg losartan and pressure that is stable at home.  Continue to monitor.  If his pressures increase, would probably need to titrate higher.       Relevant Medications   tadalafil (CIALIS) 5 MG tablet   Other Relevant Orders   EKG 12-Lead (Completed)   CT CARDIAC SCORING (SELF PAY ONLY)   Coronary artery calcification seen on CAT scan - Primary (Chronic)    Mild calcification in all 3 coronary arteries noted on Coronary Calcium Score with a score of 158 back in 2019.  He asked about when we should reassess.  I think it not unreasonable to recheck somewhere around November-December => I would expect the calcium level to go up, but just not significantly.       Relevant Medications   tadalafil (CIALIS) 5 MG tablet   Other Relevant Orders   EKG 12-Lead (Completed)   CT CARDIAC SCORING (SELF PAY ONLY)     Other   Hyperglycemia (Chronic)    Most recent A1c was 5.7.   Continue to monitor.  Continue dietary modification, and exercise.       ===================================  HPI:    Patrick [name] is a 66 y.o. male with Cardiac Risk Factors of HTN, HLD and family history of premature CAD with a mildly elevated Coronary Calcium Score who presents today for annual follow-up at the request of Marin Olp, MD.  Patrick [name] was last seen on February 25, 2021: Doing fairly well.  He just had prostate surgery, and was therefore less active.  He was noticing exertional dyspnea from deconditioning.  He then also had surgery for right hand injury.  He had recovered from [place].  Was try to get back into walking doing more routine exercise.  Denied any real exertional dyspnea or chest pain, but was not back to his 3 miles a day.  Was not yet back to the gym.  Recent Hospitalizations: None  Reviewed  CV studies:    The following studies were reviewed today: (if available, images/films reviewed: From Epic Chart or Care Everywhere) None:   Interval History:   Patrick [name] returns for annual follow-up.  He is doing pretty well.  Says he is getting a lot of exercise but not "routine exercise.  He does yoga along with some exercise at home, but not doing any gym exercises.  Has not yet  got established at a gym near his new home.  He has plenty of exercise that he can do at home and is walking quite a bit.  He denies any chest pain/pressure or dyspnea with rest or exertion.  The morning shape he gets, the less dyspneic he gets.  Otherwise stable cardiac standpoint.  He tells me his blood pressures usually run in the 244W systolic at home.  Majority the time they seem to be less than 135/85.  CV Review of Symptoms (Summary) Cardiovascular ROS: no chest pain or dyspnea on exertion negative for - edema, irregular heartbeat, orthopnea, palpitations, paroxysmal nocturnal dyspnea, rapid heart rate, shortness of breath, or lightheadedness, dizziness or wooziness,  syncope/near syncope TIA/Amaurosis fugax, claudication  REVIEWED OF SYSTEMS   Review of Systems  Constitutional:  Positive for weight loss (Minor). Negative for malaise/fatigue.  HENT:  Negative for congestion and nosebleeds.   Respiratory:  Negative for cough.   Cardiovascular:        Per HPI  Gastrointestinal:  Negative for blood in stool, constipation and melena.  Genitourinary:  Negative for hematuria.  Musculoskeletal:  Negative for joint pain.  Neurological:  Negative for dizziness, weakness and headaches.  Psychiatric/Behavioral:  Negative for memory loss. The patient is not nervous/anxious and does not have insomnia.    I have reviewed and (if needed) personally updated the patient's problem list, medications, allergies, past medical and surgical history, social and family history.   PAST MEDICAL HISTORY   Past Medical History:  Diagnosis Date   Cancer [facility])    prostate CA- bx done 11-18, hasn't started treatment   Coronary artery calcification seen on CAT scan    Coronary Calcium Score (November 13, 2018)- 158.  Mild Calcium in all 3 Epicardial coronary arteries.  Slight TA dilation ~4 cm.    Hyperlipidemia    Rosuvastatin just ordered, but not yet started.   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 [name], right   LYMPHADENECTOMY Bilateral 07/28/2020   Procedure: LYMPHADENECTOMY, PELVIC;  Surgeon: [name], MD;  Location: [facility];  Service: Urology;  Laterality: Bilateral;   PROSTATE BIOPSY     November 2018   ROBOT ASSISTED LAPAROSCOPIC RADICAL PROSTATECTOMY N/A 07/28/2020   Procedure: XI ROBOTIC ASSISTED LAPAROSCOPIC RADICAL PROSTATECTOMY LEVEL 2;  Surgeon: [name], MD;   Location: [facility];  Service: Urology;  Laterality: N/A;    Immunization History  Administered Date(s) Administered   Influenza Split 10/25/2011, 11/03/2012   Influenza Whole 10/08/2008, 09/23/2009, 09/08/2010   Influenza,inj,Quad PF,6+ Mos 11/09/2013, 10/14/2014, 08/22/2015, 09/16/2017, 10/27/2018, 10/08/2019, 11/21/2020, 10/28/2021   Influenza-Unspecified 09/14/2016   PFIZER(Purple Top)SARS-COV-2 Vaccination 03/06/2020, 03/27/2020, 10/07/2020   Td 02/17/2005   Tdap 06/09/2015   Zoster Recombinat (Shingrix) 07/13/2017, 09/16/2017    MEDICATIONS/ALLERGIES   Current Meds  Medication Sig   losartan (COZAAR) 25 MG tablet Take 1 tablet (25 mg total) by mouth daily.   rosuvastatin (CRESTOR) 20 MG tablet Take 1 tablet (20 mg total) by mouth daily.   tadalafil (CIALIS) 5 MG tablet 1 times per day   traZODone (DESYREL) 50 MG tablet Take 0.5-1 tablets (25-50 mg total) by mouth at  bedtime as needed for sleep.    No Known Allergies  SOCIAL HISTORY/FAMILY HISTORY   Reviewed in Epic:  Pertinent findings:  Social History   Tobacco Use   Smoking status: Never   Smokeless tobacco: Never  Vaping Use   Vaping Use: Never used  Substance Use Topics   Alcohol use: Yes    Comment: occasional   Drug use: No   Social History   Social History Narrative   Married 1979. 2 kids- son in [place] and daughter in [place] with 2 grandkids age 5 and 31 in 2022.       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    OBJCTIVE -PE, EKG, labs   Wt Readings from Last 3 Encounters:  04/28/22 182 lb 6.4 oz (82.7 kg)  11/24/21 184 lb (83.5 kg)  02/25/21 188 lb (85.3 kg)    Physical Exam: BP 138/82   Pulse 60   Ht '5\' 8"'$  (1.727 m)   Wt 182 lb 6.4 oz (82.7 kg)   SpO2 98%   BMI 27.73 kg/m   Physical Exam Vitals reviewed.  Constitutional:      General: He is not in acute distress.    Appearance: Normal appearance. He is normal weight. He is not ill-appearing or toxic-appearing.  HENT:     Head: Normocephalic and atraumatic.  Neck:     Vascular: No carotid bruit.  Cardiovascular:     Rate and Rhythm: Normal rate and regular rhythm.     Pulses: Normal pulses.     Heart sounds: Normal heart sounds. No murmur heard.   No friction rub. No gallop.  Pulmonary:     Effort: Pulmonary effort is normal. No respiratory distress.     Breath sounds: Normal breath sounds. No wheezing or rales.  Chest:     Chest wall: No tenderness.  Musculoskeletal:        General: No swelling. Normal range of motion.     Cervical back: Normal range of motion and neck supple.  Neurological:     General: No focal deficit present.     Mental Status: He is alert and oriented to person, place, and time. Mental status is at baseline.     Gait: Gait normal.  Psychiatric:        Mood and Affect: Mood normal.        Behavior: Behavior normal.        Thought Content: Thought content normal.        Judgment: Judgment normal.     Adult ECG Report  Rate: 60 ;  Rhythm: normal sinus rhythm and left intrafascicular block, otherwise normal intervals and durations. ;   Narrative Interpretation: Stable  Recent Labs:   Reviewed    Latest Ref Rng & Units 11/23/2021    8:17 AM 11/12/2020    8:14 AM 07/29/2020   11:01 AM  CBC  WBC 4.0 - 10.5 K/uL 4.5   4.8     Hemoglobin 13.0 - 17.0 g/dL 15.0   15.2   13.0    Hematocrit 39.0 - 52.0 % 43.9   43.8   38.7    Platelets 150.0 - 400.0 K/uL 195.0   195      Lab Results  Component Value Date   CHOL 148 11/23/2021  HDL 53.00 11/23/2021   LDLCALC 78 11/23/2021   LDLDIRECT 74.0 02/10/2021   TRIG 89.0 11/23/2021   CHOLHDL 3 11/23/2021   Lab Results  Component Value Date   CREATININE 0.82 11/23/2021   BUN 17 11/23/2021   NA 138 11/23/2021   K 4.6  11/23/2021   CL 103 11/23/2021   CO2 27 11/23/2021   Lab Results  Component Value Date   HGBA1C 5.7 11/23/2021   Lab Results  Component Value Date   TSH 2.52 06/08/2016    ==================================================  COVID-19 Education: The signs and symptoms of COVID-19 were discussed with the patient and how to seek care for testing (follow up with PCP or arrange E-visit).    I spent a total of 26 minutes with the patient spent in direct patient consultation.  Additional time spent with chart review  / charting (studies, outside notes, etc): 22 min Total Time: 48 min  Current medicines are reviewed at length with the patient today.  (+/- concerns) none  This visit occurred during the SARS-CoV-2 public health emergency.  Safety protocols were in place, including screening questions prior to the visit, additional usage of staff PPE, and extensive cleaning of exam room while observing appropriate contact time as indicated for disinfecting solutions.  Notice: This dictation was prepared with Dragon dictation along with smart phrase technology. Any transcriptional errors that result from this process are unintentional and may not be corrected upon review.  Studies Ordered:   Orders Placed This Encounter  Procedures   CT CARDIAC SCORING (SELF PAY ONLY)   EKG 12-Lead   No orders of the defined types were placed in this encounter.   Patient Instructions / Medication Changes & Studies & Tests Ordered   Patient Instructions  Medication Instructions:   No changes  *If you need a refill on your cardiac medications before your next appointment, please call your pharmacy*   Lab Work: Not needed     Testing/Procedures:   To be schedule in Nov or Dec 2023 CT coronary calcium score.   Test locations:  [facility] ([address] [place], [place] [address]) [facility] ([address] [place], [place] [address])   This is $99 out of  pocket.   Coronary CalciumScan A coronary calcium scan is an imaging test used to look for deposits of calcium and other fatty materials (plaques) in the inner lining of the blood vessels of the heart (coronary arteries). These deposits of calcium and plaques can partly clog and narrow the coronary arteries without producing any symptoms or warning signs. This puts a person at risk for a heart attack. This test can detect these deposits before symptoms develop. Tell a health care provider about: Any allergies you have. All medicines you are taking, including vitamins, herbs, eye drops, creams, and over-the-counter medicines. Any problems you or family members have had with anesthetic medicines. Any blood disorders you have. Any surgeries you have had. Any medical conditions you have. Whether you are pregnant or may be pregnant. What are the risks? Generally, this is a safe procedure. However, problems may occur, including: Harm to a pregnant woman and her unborn baby. This test involves the use of radiation. Radiation exposure can be dangerous to a pregnant woman and her unborn baby. If you are pregnant, you generally should not have this procedure done. Slight increase in the risk of cancer. This is because of the radiation involved in the test. What happens before the procedure? No preparation is needed for  this procedure. What happens during the procedure? You will undress and remove any jewelry around your neck or chest. You will put on a hospital gown. Sticky electrodes will be placed on your chest. The electrodes will be connected to an electrocardiogram (ECG) machine to record a tracing of the electrical activity of your heart. A CT scanner will take pictures of your heart. During this time, you will be asked to lie still and hold your breath for 2-3 seconds while a picture of your heart is being taken. The procedure may vary among health care providers and hospitals. What happens  after the procedure? You can get dressed. You can return to your normal activities. It is up to you to get the results of your test. Ask your health care provider, or the department that is doing the test, when your results will be ready. Summary A coronary calcium scan is an imaging test used to look for deposits of calcium and other fatty materials (plaques) in the inner lining of the blood vessels of the heart (coronary arteries). Generally, this is a safe procedure. Tell your health care provider if you are pregnant or may be pregnant. No preparation is needed for this procedure. A CT scanner will take pictures of your heart. You can return to your normal activities after the scan is done. This information is not intended to replace advice given to you by your health care provider. Make sure you discuss any questions you have with your health care provider. Document Released: 06/03/2008 Document Revised: 10/25/2016 Document Reviewed: 10/25/2016 Elsevier Interactive Patient Education  2017 [place]: At [facility], you and your health needs are our priority.  As part of our continuing mission to provide you with exceptional heart care, we have created designated Provider Care Teams.  These Care Teams include your primary Cardiologist (physician) and Advanced Practice Providers (APPs -  Physician Assistants and Nurse Practitioners) who all work together to provide you with the care you need, when you need it.     Your next appointment:   12 month(s)  The format for your next appointment:   In Person  Provider:   Glenetta Hew, MD      Glenetta Hew, M.D., M.S. Interventional Cardiologist   Pager # [phone] Phone # [phone] [address]. [place], [place] [address]   Thank you for choosing Heart[facility] at [facility]!!

## 2022-05-18 ENCOUNTER — Encounter: Payer: Self-pay | Admitting: Cardiology

## 2022-05-18 NOTE — Assessment & Plan Note (Signed)
Blood pressure is little high today, but has not been this high at home.  He is currently taking 25 mg losartan and pressure that is stable at home.  Continue to monitor.  If his pressures increase, would probably need to titrate higher.

## 2022-05-18 NOTE — Assessment & Plan Note (Signed)
Mild calcification in all 3 coronary arteries noted on Coronary Calcium Score with a score of 158 back in 2019.  He asked about when we should reassess.  I think it not unreasonable to recheck somewhere around November-December => I would expect the calcium level to go up, but just not significantly.

## 2022-05-18 NOTE — Assessment & Plan Note (Signed)
Most recent A1c was 5.7.  Continue to monitor.  Continue dietary modification, and exercise.

## 2022-05-18 NOTE — Assessment & Plan Note (Signed)
Most recent lipids from December 2022 showed LDL of 78.  This is well within target range.  He is taking 1/2 tablet of Crestor 20 mg daily.  Continue to recommend increased exercise and dietary modification.  Should be due to have labs rechecked by the end of the year.

## 2022-05-24 DIAGNOSIS — I2510 Atherosclerotic heart disease of native coronary artery without angina pectoris: Secondary | ICD-10-CM

## 2022-05-24 HISTORY — DX: Atherosclerotic heart disease of native coronary artery without angina pectoris: I25.10

## 2022-06-01 NOTE — Progress Notes (Unsigned)
Patrick [name] [place] [address] Patrick [name] Phone: [phone] Subjective:   IVilma [name], am serving as a scribe for Dr. Hulan Saas.  I'm seeing this patient by the request  of:  Marin Olp, MD  CC: right knee pain   FUX:[id number]  Patrick [name] is a 66 y.o. male coming in with complaint of R knee pain. Last seen for thumb pain in 2021. Patient states two weeks ago knee started hurting. Then started to get better. Feels 90% better. Feels it most on the posterior side.      Past Medical History:  Diagnosis Date   Cancer [name])    prostate CA- bx done 11-18, hasn't started treatment   Coronary artery calcification seen on CAT scan    Coronary Calcium Score (November 13, 2018)- 158.  Mild Calcium in all 3 Epicardial coronary arteries.  Slight TA dilation ~4 cm.    Hyperlipidemia    Rosuvastatin just ordered, but not yet started.   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 [name], right   LYMPHADENECTOMY Bilateral 07/28/2020   Procedure: LYMPHADENECTOMY, PELVIC;  Surgeon: [name], MD;  Location: [facility];  Service: Urology;  Laterality: Bilateral;   PROSTATE BIOPSY     November 2018   ROBOT ASSISTED LAPAROSCOPIC RADICAL PROSTATECTOMY N/A 07/28/2020   Procedure: XI ROBOTIC ASSISTED LAPAROSCOPIC RADICAL PROSTATECTOMY LEVEL 2;  Surgeon: [name], MD;  [facility];  Service: Urology;  Laterality: N/A;   Social History   Socioeconomic History   Marital status: Married    Spouse name: Not on file   Number of children: Not on file   Years of education: Not on file   Highest education level: Not on file  Occupational History   Occupation: [phone]     Employer: [employer]   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 2 kids- son in [place] and daughter in [place] with 2 grandkids age 56 and 86 in 2022.       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   Social Determinants of Health   Financial Resource Strain: Not on file  Food Insecurity: Not on file  Transportation Needs: Not on file  Physical Activity: Not on file  Stress: Not on file  Social Connections: Not on file   No Known Allergies Family History  Problem Relation Age of Onset   Heart disease Father        60s heavy smoker   Prostate cancer Father        55s or 64s   Hyperlipidemia Father  Diabetes Mother    Cancer Mother 15       lung cancer-nonsmoker but 2nd hand smoke   Lung cancer Mother        died at 57   Heart disease Brother    Colon cancer Neg Hx    Esophageal cancer Neg Hx    Rectal cancer Neg Hx    Stomach cancer Neg Hx    Breast cancer Neg Hx      Current Outpatient Medications (Cardiovascular):    losartan (COZAAR) 25 MG tablet, Take 1 tablet (25 mg total) by mouth daily.   rosuvastatin (CRESTOR) 20 MG tablet, Take 1 tablet (20 mg total) by mouth daily.   tadalafil (CIALIS) 5 MG tablet, 1 times per day     Current Outpatient Medications (Other):    traZODone (DESYREL) 50 MG tablet, Take 0.5-1 tablets (25-50 mg total) by mouth at bedtime as needed for sleep.   Reviewed prior external information including notes and imaging from  primary  care provider As well as notes that were available from care everywhere and other healthcare systems.  Past medical history, social, surgical and family history all reviewed in electronic medical record.  No pertanent information unless stated regarding to the chief complaint.   Review of Systems:  No headache, visual changes, nausea, vomiting, diarrhea, constipation, dizziness, abdominal pain, skin rash, fevers, chills, night sweats, weight loss, swollen lymph nodes, body aches, joint swelling, chest pain, shortness of breath, mood changes. POSITIVE muscle aches  Objective  Blood pressure 128/84, pulse 61, height '5\' 8"'$  (1.727 m), weight 181 lb (82.1 kg), SpO2 98 %.   General: No apparent distress alert and oriented x3 mood and affect normal, dressed appropriately.  HEENT: Pupils equal, extraocular movements intact  Respiratory: Patient's speak in full sentences and does not appear short of breath  Cardiovascular: No lower extremity edema, non tender, no erythema  Right knee exam shows good range of motion noted.  Trace effusion of the patellofemoral joint noted.  Very minimal discomfort noted in the popliteal area with no masses appreciated.  Neurovascular intact distally.  Good stability of the knee.  Limited muscular skeletal ultrasound was performed and interpreted by Hulan Saas, M  Limited ultrasound of patient's knee shows a trace effusion noted but good articular cartilage of the patellofemoral joint.  Patient does have postsurgical changes noted of the medial meniscus.  Patient's lateral meniscus appears to be unremarkable.  In the popliteal area patient does have some mild hypoechoic changes noted in the blood vessels that could be calcific.  Does seem to compress but a little more challenging than usual of the popliteal vein. Impression: Postsurgical changes    Impression and Recommendations:     The above documentation has been reviewed and is accurate and complete [name], DO

## 2022-06-02 ENCOUNTER — Ambulatory Visit (INDEPENDENT_AMBULATORY_CARE_PROVIDER_SITE_OTHER): Payer: Medicare Other | Admitting: Family Medicine

## 2022-06-02 ENCOUNTER — Ambulatory Visit (INDEPENDENT_AMBULATORY_CARE_PROVIDER_SITE_OTHER)
Admission: RE | Admit: 2022-06-02 | Discharge: 2022-06-02 | Disposition: A | Discharge status: Home / Self Care | Payer: Medicare Other | Source: Ambulatory Visit | Attending: Cardiology | Admitting: Cardiology

## 2022-06-02 ENCOUNTER — Ambulatory Visit: Payer: Medicare Other

## 2022-06-02 ENCOUNTER — Ambulatory Visit (INDEPENDENT_AMBULATORY_CARE_PROVIDER_SITE_OTHER): Payer: Medicare Other

## 2022-06-02 ENCOUNTER — Encounter: Payer: Self-pay | Admitting: Family Medicine

## 2022-06-02 VITALS — BP 128/84 | HR 61 | Ht 68.0 in | Wt 181.0 lb

## 2022-06-02 DIAGNOSIS — M25561 Pain in right knee: Secondary | ICD-10-CM

## 2022-06-02 DIAGNOSIS — I2510 Atherosclerotic heart disease of native coronary artery without angina pectoris: Secondary | ICD-10-CM

## 2022-06-02 DIAGNOSIS — M2550 Pain in unspecified joint: Secondary | ICD-10-CM

## 2022-06-02 DIAGNOSIS — E785 Hyperlipidemia, unspecified: Secondary | ICD-10-CM

## 2022-06-02 DIAGNOSIS — I10 Essential (primary) hypertension: Secondary | ICD-10-CM

## 2022-06-02 NOTE — Assessment & Plan Note (Signed)
Nonspecific pain.  Patient does have postsurgical changes and likely does have some mild arthritic changes noted as well.  Discussed which activities to do which ones to avoid.  Discussed with patient that we did have some mild discomfort in the popliteal area we will get a D-dimer with patient recent travel.  Patient also has a history of prostate cancer.  We will get x-rays.  Likely outpatient I think will do well with conservative therapy including home exercises.  Follow-up with me in 6 weeks.  Worsening pain consider the possibility of injections and physical therapy.

## 2022-06-02 NOTE — Patient Instructions (Signed)
Heel lifts D dimer Xray R knee See me again in 6 weeks

## 2022-06-03 ENCOUNTER — Encounter: Payer: Self-pay | Admitting: Family Medicine

## 2022-06-03 LAB — D-DIMER, QUANTITATIVE: D-Dimer, Quant: 0.51 mcg/mL FEU — ABNORMAL HIGH (ref <0.50)

## 2022-06-04 ENCOUNTER — Encounter: Payer: Self-pay | Admitting: Cardiology

## 2022-06-05 NOTE — Telephone Encounter (Signed)
Aggressive risk factor modification includes shooting for target LDL less than 70, blood pressure control with systolic pressures averaging 120s to 130s over 70s to 80s.  Blood sugar levels well controlled with an A1c less than 6.  For smokers, smoking cessation is mandatory.  Continued exercise and activity to evaluate for potential symptoms of chest tightness pressure or shortness of breath that is worsening with exertion.  Remember this was a screening test to evaluate for progression of disease.  It is not unexpected that the calcium level will go up, to a certain extent this can mean stabilization of existing plaque. As for the 5 mm right upper lobe nodule, it was thought to be relatively low risk, but can be evaluated with a noncontrasted chest CT scan in a year.-Usually, these findings are more concerning in patients were long-term smokers.  Glenetta Hew, MD

## 2022-06-06 ENCOUNTER — Encounter: Payer: Self-pay | Admitting: Cardiology

## 2022-07-14 ENCOUNTER — Ambulatory Visit: Payer: Medicare Other | Admitting: Family Medicine

## 2022-07-20 ENCOUNTER — Encounter: Payer: Self-pay | Admitting: Cardiology

## 2022-07-20 ENCOUNTER — Ambulatory Visit (INDEPENDENT_AMBULATORY_CARE_PROVIDER_SITE_OTHER): Payer: Medicare Other | Admitting: Cardiology

## 2022-07-20 VITALS — BP 120/80 | HR 69 | Resp 20 | Ht 68.0 in | Wt 181.2 lb

## 2022-07-20 DIAGNOSIS — R739 Hyperglycemia, unspecified: Secondary | ICD-10-CM | POA: Diagnosis not present

## 2022-07-20 DIAGNOSIS — I10 Essential (primary) hypertension: Secondary | ICD-10-CM

## 2022-07-20 DIAGNOSIS — I2510 Atherosclerotic heart disease of native coronary artery without angina pectoris: Secondary | ICD-10-CM | POA: Diagnosis not present

## 2022-07-20 DIAGNOSIS — E785 Hyperlipidemia, unspecified: Secondary | ICD-10-CM | POA: Diagnosis not present

## 2022-07-20 NOTE — Progress Notes (Unsigned)
Primary Care Provider: No primary care provider on file. -[place], MD; [facility]  [address] [place],  [address] Phone [phone] Fax [phone]  Cardiologist: Glenetta Hew, MD Electrophysiologist: None  Clinic Note: Chief Complaint  Patient presents with   Coronary Artery Disease    Wanted to discuss results of follow-up Coronary Calcium Score   ===================================  ASSESSMENT/PLAN   Problem List Items Addressed This Visit       Cardiology Problems   Hyperlipidemia LDL goal <70 (Chronic)    The Coronary Calcium Score of over 400 warrants more aggressive management with an LDL goal less than 70. As of December 2022 was LDL was 78 and had been 74 in February.  Would like to try to get him down to less than 70.  We will therefore have him increase his rosuvastatin to alternate 20 mg 1 day and 10 mg the next day.  Should have recheck of lipid panel in roughly 6 months.  If not at goal, would increase to 20 mg daily.  This can be titrated TO 40 mg if necessary.      Coronary artery calcification seen on CAT scan - Primary (Chronic)    Calcium score now elevated to 440.  It is not unusual to have an increase in calcium score over the last 4 years although this could mean that continued aggressive risk factor modification is warranted.  It does not necessarily mean that he has more obstructive disease, it could be stabilization of the existing disease.  We went through quite a bit of discussion about the pathophysiology.  We also talked about the aortic dilation.  This has been stable from last check in roughly 4 cm.  I think it something we can reasonably be checked again in the year so.  I do not think it occurs in the next couple years to the point where.  Continue to work on diet and exercise.  Increase rosuvastatin to 20 mg 1 day, 10 mg the next day alternating with target LDL less than 70. Continue  PT. Would recommend aspirin 81 mg daily      Essential hypertension (Chronic)    Stable blood pressure.  Well-controlled with low-dose ARB.        Other   Hyperglycemia (Chronic)    Most recent A1c was 5.7.  Continue to monitor     ===================================  HPI:    Patrick [name] is a 66 y.o. male with Cardiac Risk Factors of HTN, HLD and family history of premature CAD with a mildly elevated Coronary Calcium Score who presents today for brief follow-up visit to discuss results of repeat coronary calcium score.    Patrick [name] was last seen on Apr 28, 2022 for delayed annual follow-up doing pretty well.  Was doing a lot of exercise, but not any routine exercise.  Likes to do exercises at home, has and not yet joined a gym after moving.  He was doing a lot of work fixing up the house etc.  He did notice that he was quite deconditioned shortly after moving and was getting out of breath more easily than usual.  However [place] exercises/the better shape he gets and, he feels less and less dyspneic.  No chest pain or pressure at rest or exertion.  Home BPs usually run in the 120s/70s at home.  Usually less than 135/85.   We decided to recheck Coronary Calcium Score since he had 1 done in  2019.  He also notes that he has been in the process of transferring his care to [facility] where he recently moved.  When I last saw he was transferring from Dr. Yong Channel here in [place].  Recent Hospitalizations: None  Reviewed  CV studies:    The following studies were reviewed today: (if available, images/films reviewed: From Epic Chart or Care Everywhere) 06/03/2022 -> Coronary Calcium Score: Total score 440. Left main: , LAD 261, LCx 67, RCA 109.  81st percentile.  Mild ascending aortic dilation and atherosclerosis noted (~40 mm).  5 mm right upper lobe nodule.  Likely benign.  Only need follow-up if low risk patient.  Interval History:   Patrick [name] returns to  discuss results of his relook Coronary Calcium Score.  He was concerned at the level run higher than it had been and wanted to me to discuss him however his mother to adjust his regimen.  There is also concern about some noncardiac findings including the small nodule seen.  He remains completely asymptomatic with no chest pain or pressure at rest or exertion. He has formalize his move to [facility].  He is getting quite a bit of exercise although it is not routine regimen.  He is doing some home exercises in his home gym as well as walking.  He also is doing lots of chores in the house.  Cardiovascular ROS: no chest pain or dyspnea on exertion negative for - edema, irregular heartbeat, loss of consciousness, orthopnea, palpitations, paroxysmal nocturnal dyspnea, rapid heart rate, shortness of breath, or  lightheadedness, dizziness or wooziness, near ear syncope, TIA/amaurosis fugax or claudication.   REVIEWED OF SYSTEMS   Review of Systems  Constitutional:  Positive for weight loss (Minor; he had actually gained some weight and now is get back.). Negative for malaise/fatigue.  HENT:  Negative for congestion and nosebleeds.   Respiratory:  Negative for cough.   Cardiovascular:        Per HPI  Gastrointestinal:  Negative for blood in stool and melena.  Genitourinary:  Negative for hematuria.  Musculoskeletal:  Negative for joint pain.  Neurological:  Negative for dizziness, weakness and headaches.  Psychiatric/Behavioral:  Negative for memory loss. The patient is not nervous/anxious and does not have insomnia.    I have reviewed and (if needed) personally updated the patient's problem list, medications, allergies, past medical and surgical history, social and family history.   PAST MEDICAL HISTORY   Past Medical History:  Diagnosis Date   Cancer [facility])    prostate CA- bx done 11-18, hasn't started treatment   Coronary artery calcification seen on CAT scan 05/24/2022   Coronary  Calcium Score (10/2018)- 158.  Mild Calcium in all 3 Epicardial Coronaries.  Slight TA dilation ~4 cm.; 05/2022: Coronary Calcium Score: 440. Left main: , LAD 261, LCx 67, RCA 109.  81st percentile.  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   Hyperlipidemia    Rosuvastatin just ordered, but not yet started.   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 [name], right   LYMPHADENECTOMY Bilateral 07/28/2020   Procedure: LYMPHADENECTOMY, PELVIC;  Surgeon: [name], MD;  Location: [facility];  Service: Urology;  Laterality: Bilateral;   PROSTATE BIOPSY     November 2018   ROBOT ASSISTED LAPAROSCOPIC RADICAL PROSTATECTOMY N/A 07/28/2020   Procedure: XI ROBOTIC ASSISTED LAPAROSCOPIC RADICAL PROSTATECTOMY LEVEL 2;  Surgeon: [name], MD;  Location: [facility];  Service: Urology;  Laterality: N/A;    Immunization History  Administered Date(s) Administered   Influenza Split 10/25/2011, 11/03/2012   Influenza Whole 10/08/2008, 09/23/2009, 09/08/2010   Influenza,inj,Quad PF,6+ Mos 11/09/2013, 10/14/2014, 08/22/2015, 09/16/2017, 10/27/2018, 10/08/2019, 11/21/2020, 10/28/2021   Influenza-Unspecified 09/14/2016   PFIZER(Purple Top)SARS-COV-2 Vaccination 03/06/2020, 03/27/2020, 10/07/2020   Td 02/17/2005   Tdap 06/09/2015   Zoster Recombinat (Shingrix) 07/13/2017, 09/16/2017    MEDICATIONS/ALLERGIES   Current Meds  Medication Sig   losartan (COZAAR) 25 MG tablet Take 1 tablet (25 mg total) by mouth daily.   rosuvastatin (CRESTOR) 20 MG tablet Take 1 tablet (20 mg total) by mouth daily.   tadalafil (CIALIS) 5 MG tablet 1 times per day   traZODone (DESYREL) 50 MG tablet Take 0.5-1 tablets (25-50  mg total) by mouth at bedtime as needed for sleep.    No Known Allergies  SOCIAL HISTORY/FAMILY HISTORY   Reviewed in Epic:  Pertinent findings:  Social History   Tobacco Use   Smoking status: Never   Smokeless tobacco: Never  Vaping Use   Vaping Use: Never used  Substance Use Topics   Alcohol use: Yes    Comment: occasional   Drug use: No   Social History   Social History Narrative   Married 1979. 2 kids- son in [place] and daughter in [place] with 2 grandkids age 73 and 68 in 2022.       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   He indicated that his mother is deceased. He indicated that his father is deceased. He indicated that the status of his brother is unknown. He indicated that the status of his neg hx is unknown.    OBJCTIVE -PE, EKG, labs   Wt Readings from Last 3 Encounters:  07/20/22 181 lb 3.2 oz (82.2 kg)  06/02/22 181 lb (82.1 kg)  04/28/22 182 lb 6.4 oz (82.7 kg)    Physical Exam: BP 120/80 (BP Location: Left Arm, Patient Position: Sitting, Cuff Size: Normal)   Pulse 69   Resp 20   Ht '5\' 8"'$  (1.727 m)   Wt 181 lb 3.2 oz (82.2 kg)   SpO2 98%   BMI 27.55 kg/m  Physical Exam Vitals reviewed.  Constitutional:      General: He is not in acute distress.    Appearance: Normal appearance. He is normal weight. He is not ill-appearing or toxic-appearing.  HENT:     Head: Normocephalic and atraumatic.  Neck:     Vascular: No carotid bruit.  Cardiovascular:     Rate and Rhythm: Normal rate and regular rhythm.     Pulses: Normal pulses.     Heart sounds: Normal heart sounds. No murmur heard.    No friction rub. No gallop.  Pulmonary:     Effort: Pulmonary effort is normal. No respiratory distress.     Breath sounds: Normal breath sounds. No  wheezing or rales.  Chest:     Chest wall: No tenderness.  Musculoskeletal:        General: No swelling. Normal range of motion.     Cervical back: Normal range of motion and neck supple.  Neurological:     General: No focal deficit present.     Mental Status: He is alert and oriented to person, place, and time. Mental status is at baseline.     Gait: Gait normal.  Psychiatric:        Mood and Affect: Mood normal.        Behavior: Behavior normal.        Thought Content: Thought content normal.        Judgment: Judgment normal.     Adult ECG Report  Rate: 60 ;  Rhythm: normal sinus rhythm and left intrafascicular block, otherwise normal intervals and durations. ;   Narrative Interpretation: Stable  Recent Labs:   Reviewed    Latest Ref Rng & Units 11/23/2021    8:17 AM 11/12/2020    8:14 AM 07/29/2020   11:01 AM  CBC  WBC 4.0 - 10.5 K/uL 4.5   4.8     Hemoglobin 13.0 - 17.0 g/dL 15.0   15.2   13.0    Hematocrit 39.0 - 52.0 % 43.9   43.8   38.7    Platelets 150.0 - 400.0 K/uL 195.0   195      Lab Results  Component Value Date   CHOL 148 11/23/2021   HDL 53.00 11/23/2021   LDLCALC 78 11/23/2021   LDLDIRECT 74.0 02/10/2021   TRIG 89.0 11/23/2021   CHOLHDL 3 11/23/2021   Lab Results  Component Value Date   CREATININE 0.82 11/23/2021   BUN 17 11/23/2021   NA 138 11/23/2021   K 4.6 11/23/2021   CL 103 11/23/2021   CO2 27 11/23/2021   Lab Results  Component Value Date   HGBA1C 5.7 11/23/2021   Lab Results  Component Value Date   TSH 2.52 06/08/2016    ==================================================   I spent a total of 36 minutes with the patient spent in direct patient consultation. => Detailed discussion about findings Additional time spent with chart review  / charting (studies, outside notes, etc): 16 min Total Time: 52 min  Current medicines are reviewed at length with the patient today.  (+/- concerns) none  Notice: This dictation was prepared with  Dragon dictation along with smart phrase technology. Any transcriptional errors that result from this process are unintentional and may not be corrected upon review.  Studies Ordered:   No orders of the defined types were placed in this encounter.  No orders of the defined types were placed in this encounter.   Patient Instructions / Medication Changes & Studies & Tests Ordered   Patient Instructions  Medication Instructions:   Rosuvastatin   alternate with 10 mg  with 20 mg every other day   Once you check cholesterol panel in 6 months  if LDL is greater than 70  increase to  Rosuvastatin 20 mg daily   *If you need a refill on your cardiac medications before your next appointment, please call your pharmacy*   Lab Work: Not needed   Testing/Procedures:  Not needed  Follow-Up: At [facility], you and your health needs are our priority.  As part of our continuing mission to provide you with exceptional heart care, we have created designated Provider Care Teams.  These Care Teams include your primary Cardiologist (physician) and Advanced Practice Providers (APPs -  Physician Assistants and Nurse  Practitioners) who all work together to provide you with the care you need, when you need it.     Your next appointment:   12 month(s)  The format for your next appointment:   In Person  Provider:   Glenetta Hew, MD     Addendum: Labs from 07/27/2022:        Glenetta Hew, M.D., M.S. Interventional [place] Pager # [phone] Phone # [phone] [address] [place], [place] [address]   Thank you for choosing Heartcare at [facility]!!

## 2022-07-20 NOTE — Patient Instructions (Signed)
Medication Instructions:   Rosuvastatin   alternate with 10 mg  with 20 mg every other day   Once you check cholesterol panel in 6 months  if LDL is greater than 70  increase to  Rosuvastatin 20 mg daily   *If you need a refill on your cardiac medications before your next appointment, please call your pharmacy*   Lab Work: Not needed   Testing/Procedures:  Not needed  Follow-Up: At [facility], you and your health needs are our priority.  As part of our continuing mission to provide you with exceptional heart care, we have created designated Provider Care Teams.  These Care Teams include your primary Cardiologist (physician) and Advanced Practice Providers (APPs -  Physician Assistants and Nurse Practitioners) who all work together to provide you with the care you need, when you need it.     Your next appointment:   12 month(s)  The format for your next appointment:   In Person  Provider:   Glenetta Hew, MD

## 2022-08-05 ENCOUNTER — Encounter: Payer: Self-pay | Admitting: Family Medicine

## 2022-08-14 ENCOUNTER — Encounter: Payer: Self-pay | Admitting: Cardiology

## 2022-08-14 NOTE — Assessment & Plan Note (Signed)
The Coronary Calcium Score of over 400 warrants more aggressive management with an LDL goal less than 70. As of December 2022 was LDL was 78 and had been 74 in February.  Would like to try to get him down to less than 70.  We will therefore have him increase his rosuvastatin to alternate 20 mg 1 day and 10 mg the next day.  Should have recheck of lipid panel in roughly 6 months.  If not at goal, would increase to 20 mg daily.  This can be titrated TO 40 mg if necessary.

## 2022-08-14 NOTE — Assessment & Plan Note (Signed)
Most recent A1c was 5.7.  Continue to monitor

## 2022-08-14 NOTE — Assessment & Plan Note (Signed)
Calcium score now elevated to 440.  It is not unusual to have an increase in calcium score over the last 4 years although this could mean that continued aggressive risk factor modification is warranted.  It does not necessarily mean that he has more obstructive disease, it could be stabilization of the existing disease.  We went through quite a bit of discussion about the pathophysiology.  We also talked about the aortic dilation.  This has been stable from last check in roughly 4 cm.  I think it something we can reasonably be checked again in the year so.  I do not think it occurs in the next couple years to the point where.  Continue to work on diet and exercise.  Increase rosuvastatin to 20 mg 1 day, 10 mg the next day alternating with target LDL less than 70. Continue PT. Would recommend aspirin 81 mg daily

## 2022-08-14 NOTE — Assessment & Plan Note (Signed)
Stable blood pressure.  Well-controlled with low-dose ARB.

## 2022-10-21 ENCOUNTER — Encounter: Payer: Self-pay | Admitting: Cardiology

## 2022-10-21 DIAGNOSIS — R072 Precordial pain: Secondary | ICD-10-CM

## 2022-10-21 DIAGNOSIS — I2510 Atherosclerotic heart disease of native coronary artery without angina pectoris: Secondary | ICD-10-CM

## 2022-10-21 NOTE — Telephone Encounter (Signed)
Makes sense.  We can either do a Myoview stress test or we could do a coronary CT angiogram which would give [place] a lot more information about extent of cholesterol plaque.  I would be more in favor of a couple weeks of recovery and then doing it coronary CT angiogram for precordial chest pain and elevated Coronary Calcium Score.  This way we do not miss a proximal lesion that may be overlooked by stress test.  Glenetta Hew, MD

## 2022-10-22 MED ORDER — METOPROLOL TARTRATE 50 MG PO TABS: 50.0000 mg | ORAL_TABLET | Freq: Once | ORAL | 0 refills | Status: DC

## 2022-10-22 NOTE — Telephone Encounter (Signed)
Sent message to patient  Will contact patient to give him details of test  verbal and written

## 2022-10-27 ENCOUNTER — Telehealth: Payer: Self-pay | Admitting: *Deleted

## 2022-10-27 DIAGNOSIS — I2510 Atherosclerotic heart disease of native coronary artery without angina pectoris: Secondary | ICD-10-CM

## 2022-10-27 DIAGNOSIS — R072 Precordial pain: Secondary | ICD-10-CM

## 2022-10-27 NOTE — Telephone Encounter (Signed)
Called patient - left message on voicemail - instruction sent by mychart. Will call later today to  go over instructions verbally.

## 2022-10-27 NOTE — Telephone Encounter (Signed)
RN calling back to see if patient had question in regards to  upcoming test.  Will try again to speak with patient

## 2022-11-01 NOTE — Telephone Encounter (Signed)
Patient sent message via mychart. Question answered. Aware can reach out to nurse if any question arise.

## 2022-11-03 ENCOUNTER — Inpatient Hospital Stay: Admission: RE | Admit: 2022-11-03 | Payer: No Typology Code available for payment source | Source: Ambulatory Visit

## 2022-11-09 MED ORDER — METOPROLOL TARTRATE 50 MG PO TABS
50.0000 mg | ORAL_TABLET | Freq: Once | ORAL | 0 refills | Status: DC
Start: 2022-11-09 — End: 2023-02-23

## 2022-11-09 NOTE — Addendum Note (Signed)
Addended by: Raiford Simmonds on: 11/09/2022 09:11 AM   Modules accepted: Orders

## 2022-11-10 NOTE — Telephone Encounter (Signed)
If that area is still there, a CT scan will report.   Glenetta Hew, MD

## 2022-11-18 ENCOUNTER — Telehealth (HOSPITAL_COMMUNITY): Payer: Self-pay | Admitting: *Deleted

## 2022-11-18 NOTE — Telephone Encounter (Signed)
Reaching out to patient to offer assistance regarding upcoming cardiac imaging study; pt verbalizes understanding of appt date/time, parking situation and where to check in,  medications ordered, and verified current allergies; name and call back number provided for further questions should they arise  Patrick Clement RN Navigator Cardiac Imaging [facility] [phone] office [phone] cell  Patient to take '50mg'$  metoprolol tartrate two hours prior to his cardiac CT scan.  He is aware to arrive at 7:30am.

## 2022-11-19 ENCOUNTER — Other Ambulatory Visit: Payer: Self-pay

## 2022-11-19 ENCOUNTER — Encounter: Payer: Self-pay | Admitting: Cardiology

## 2022-11-19 ENCOUNTER — Emergency Department (HOSPITAL_BASED_OUTPATIENT_CLINIC_OR_DEPARTMENT_OTHER)
Admit: 2022-11-19 | Discharge: 2022-11-19 | Disposition: A | Discharge status: Home / Self Care | Payer: Medicare Other | Attending: Cardiology | Admitting: Cardiology

## 2022-11-19 ENCOUNTER — Emergency Department (HOSPITAL_BASED_OUTPATIENT_CLINIC_OR_DEPARTMENT_OTHER): Payer: Medicare Other

## 2022-11-19 ENCOUNTER — Encounter (HOSPITAL_COMMUNITY): Payer: Self-pay

## 2022-11-19 ENCOUNTER — Other Ambulatory Visit: Payer: Self-pay | Admitting: Cardiology

## 2022-11-19 ENCOUNTER — Emergency Department (HOSPITAL_COMMUNITY)
Admission: EM | Admit: 2022-11-19 | Discharge: 2022-11-19 | Disposition: A | Discharge status: Home / Self Care | Payer: Medicare Other | Attending: Emergency Medicine | Admitting: Emergency Medicine

## 2022-11-19 ENCOUNTER — Emergency Department (HOSPITAL_BASED_OUTPATIENT_CLINIC_OR_DEPARTMENT_OTHER)
Admission: RE | Admit: 2022-11-19 | Discharge: 2022-11-19 | Disposition: A | Discharge status: Home / Self Care | Payer: Medicare Other | Source: Ambulatory Visit | Attending: Cardiology | Admitting: Cardiology

## 2022-11-19 DIAGNOSIS — I3139 Other pericardial effusion (noninflammatory): Secondary | ICD-10-CM | POA: Diagnosis present

## 2022-11-19 DIAGNOSIS — I5031 Acute diastolic (congestive) heart failure: Secondary | ICD-10-CM

## 2022-11-19 DIAGNOSIS — I2510 Atherosclerotic heart disease of native coronary artery without angina pectoris: Secondary | ICD-10-CM | POA: Insufficient documentation

## 2022-11-19 DIAGNOSIS — R931 Abnormal findings on diagnostic imaging of heart and coronary circulation: Secondary | ICD-10-CM | POA: Diagnosis not present

## 2022-11-19 DIAGNOSIS — I7121 Aneurysm of the ascending aorta, without rupture: Secondary | ICD-10-CM

## 2022-11-19 DIAGNOSIS — R072 Precordial pain: Secondary | ICD-10-CM

## 2022-11-19 DIAGNOSIS — J90 Pleural effusion, not elsewhere classified: Secondary | ICD-10-CM | POA: Diagnosis not present

## 2022-11-19 LAB — ECHOCARDIOGRAM COMPLETE
AR max vel: 2.93 cm2
AV Area VTI: 2.56 cm2
AV Area mean vel: 2.71 cm2
AV Mean grad: 3 mmHg
AV Peak grad: 5.3 mmHg
Ao pk vel: 1.15 m/s
Area-P 1/2: 4.21 cm2
Height: 68 in
S' Lateral: 2.6 cm
Weight: 2720 oz

## 2022-11-19 LAB — CBC WITH DIFFERENTIAL/PLATELET
Abs Immature Granulocytes: 0.02 10*3/uL (ref 0.00–0.07)
Basophils Absolute: 0 10*3/uL (ref 0.0–0.1)
Basophils Relative: 0 %
Eosinophils Absolute: 0.1 10*3/uL (ref 0.0–0.5)
Eosinophils Relative: 1 %
HCT: 39.8 % (ref 39.0–52.0)
Hemoglobin: 12.6 g/dL — ABNORMAL LOW (ref 13.0–17.0)
Immature Granulocytes: 0 %
Lymphocytes Relative: 13 %
Lymphs Abs: 0.7 10*3/uL (ref 0.7–4.0)
MCH: 29 pg (ref 26.0–34.0)
MCHC: 31.7 g/dL (ref 30.0–36.0)
MCV: 91.5 fL (ref 80.0–100.0)
Monocytes Absolute: 0.5 10*3/uL (ref 0.1–1.0)
Monocytes Relative: 9 %
Neutro Abs: 3.9 10*3/uL (ref 1.7–7.7)
Neutrophils Relative %: 77 %
Platelets: 323 10*3/uL (ref 150–400)
RBC: 4.35 MIL/uL (ref 4.22–5.81)
RDW: 13.2 % (ref 11.5–15.5)
WBC: 5.1 10*3/uL (ref 4.0–10.5)
nRBC: 0 % (ref 0.0–0.2)

## 2022-11-19 LAB — COMPREHENSIVE METABOLIC PANEL
ALT: 21 U/L (ref 0–44)
AST: 20 U/L (ref 15–41)
Albumin: 2.9 g/dL — ABNORMAL LOW (ref 3.5–5.0)
Alkaline Phosphatase: 62 U/L (ref 38–126)
Anion gap: 7 (ref 5–15)
BUN: 7 mg/dL — ABNORMAL LOW (ref 8–23)
CO2: 26 mmol/L (ref 22–32)
Calcium: 8.8 mg/dL — ABNORMAL LOW (ref 8.9–10.3)
Chloride: 103 mmol/L (ref 98–111)
Creatinine, Ser: 0.79 mg/dL (ref 0.61–1.24)
GFR, Estimated: >60 mL/min (ref >60)
Glucose, Bld: 97 mg/dL (ref 70–99)
Potassium: 4.2 mmol/L (ref 3.5–5.1)
Sodium: 136 mmol/L (ref 135–145)
Total Bilirubin: 0.3 mg/dL (ref 0.3–1.2)
Total Protein: 6.3 g/dL — ABNORMAL LOW (ref 6.5–8.1)

## 2022-11-19 LAB — BRAIN NATRIURETIC PEPTIDE: B Natriuretic Peptide: 48.8 pg/mL (ref 0.0–100.0)

## 2022-11-19 MED ORDER — METOPROLOL TARTRATE 5 MG/5ML IV SOLN
INTRAVENOUS | Status: AC
  Administered 2022-11-19: 5 mg
  Filled 2022-11-19: qty 5

## 2022-11-19 MED ORDER — SODIUM CHLORIDE 0.9 % IV SOLN: INTRAVENOUS | Status: DC

## 2022-11-19 MED ORDER — IOHEXOL 350 MG/ML SOLN
95.0000 mL | Freq: Once | INTRAVENOUS | Status: AC | PRN
  Administered 2022-11-19: 95 mL via INTRAVENOUS

## 2022-11-19 MED ORDER — NITROGLYCERIN 0.4 MG SL SUBL: 0.8000 mg | SUBLINGUAL_TABLET | Freq: Once | SUBLINGUAL | Status: AC

## 2022-11-19 MED ORDER — NITROGLYCERIN 0.4 MG SL SUBL
SUBLINGUAL_TABLET | SUBLINGUAL | Status: AC
  Administered 2022-11-19: 0.8 mg via SUBLINGUAL
  Filled 2022-11-19: qty 2

## 2022-11-19 NOTE — ED Provider Notes (Signed)
[facility] EMERGENCY DEPARTMENT Provider Note   CSN: [id number] Arrival date & time: 11/19/22  0849     History  Chief Complaint  Patient presents with   Shortness of Breath    Patrick [name] is a 66 y.o. male.  66 year old male who presents after his CT of his chest today showed a pericardial effusion along with pleural effusions.  Patient states that he has been treated with multiple rounds of antibiotics for a recent pulmonary infection.  He denies any new chest pain or chest pressure.  No orthopnea.  Slight dyspnea on exertion.  No fever chills no cough congestion.  States of last few days he has been feeling better.  Was having a CT angio of his coronaries today and was found to have the above findings.  Sent to the ED for further management       Home Medications Prior to Admission medications   Medication Sig Start Date End Date Taking? Authorizing Provider  losartan (COZAAR) 25 MG tablet Take 1 tablet (25 mg total) by mouth daily. 11/24/21   Marin Olp, MD  metoprolol tartrate (LOPRESSOR) 50 MG tablet Take 1 tablet (50 mg total) by mouth once for 1 dose. TAKE TWO HOURS PRIOR TO  SCHEDULE CARDIAC TEST 11/09/22 11/09/22  Leonie Man, MD  rosuvastatin (CRESTOR) 20 MG tablet Take 1 tablet (20 mg total) by mouth daily. 11/24/21   Marin Olp, MD  tadalafil (CIALIS) 5 MG tablet 1 times per day    [provider]  traZODone (DESYREL) 50 MG tablet Take 0.5-1 tablets (25-50 mg total) by mouth at bedtime as needed for sleep. 11/24/21   Marin Olp, MD      Allergies    Patient has no known allergies.    Review of Systems   Review of Systems  All other systems reviewed and are negative.   Physical Exam Updated Vital Signs BP 111/76 (BP Location: Right Arm)   Pulse 65   Temp 98.7 F (37.1 C)   Resp 16   Ht 1.727 m ('5\' 8"'$ )   Wt 77.1 kg   SpO2 93%   BMI 25.85 kg/m  Physical Exam Vitals and nursing note reviewed.   Constitutional:      General: He is not in acute distress.    Appearance: Normal appearance. He is well-developed. He is not toxic-appearing.  HENT:     Head: Normocephalic and atraumatic.  Eyes:     General: Lids are normal.     Conjunctiva/sclera: Conjunctivae normal.     Pupils: Pupils are equal, round, and reactive to light.  Neck:     Thyroid: No thyroid mass.     Trachea: No tracheal deviation.  Cardiovascular:     Rate and Rhythm: Normal rate and regular rhythm.     Heart sounds: Normal heart sounds. No murmur heard.    No gallop.  Pulmonary:     Effort: Pulmonary effort is normal. No respiratory distress.     Breath sounds: Normal breath sounds. No stridor. No decreased breath sounds, wheezing, rhonchi or rales.  Abdominal:     General: There is no distension.     Palpations: Abdomen is soft.     Tenderness: There is no abdominal tenderness. There is no rebound.  Musculoskeletal:        General: No tenderness. Normal range of motion.     Cervical back: Normal range of motion and neck supple.  Skin:    General:  Skin is warm and dry.     Findings: No abrasion or rash.  Neurological:     Mental Status: He is alert and oriented to person, place, and time. Mental status is at baseline.     GCS: GCS eye subscore is 4. GCS verbal subscore is 5. GCS motor subscore is 6.     Cranial Nerves: No cranial nerve deficit.     Sensory: No sensory deficit.     Motor: Motor function is intact.  Psychiatric:        Attention and Perception: Attention normal.        Speech: Speech normal.        Behavior: Behavior normal.     ED Results / Procedures / Treatments   Labs (all labs ordered are listed, but only abnormal results are displayed) Labs Reviewed  CBC WITH DIFFERENTIAL/PLATELET  COMPREHENSIVE METABOLIC PANEL  BRAIN NATRIURETIC PEPTIDE    EKG EKG Interpretation  Date/Time:  Friday November 19 2022 09:50:09 EST Ventricular Rate:  62 PR Interval:  187 QRS  Duration: 92 QT Interval:  564 QTC Calculation: 573 R Axis:   128 Text Interpretation: Sinus rhythm Right axis deviation Low voltage, precordial leads Probable anteroseptal infarct, old Prolonged QT interval No significant change since last tracing Confirmed by Lacretia Leigh ([id number]) on 11/19/2022 11:16:02 AM  Radiology No results found.  Procedures Procedures    Medications Ordered in ED Medications  0.9 %  sodium chloride infusion (has no administration in time range)    ED Course/ Medical Decision Making/ A&P                           Medical Decision Making Amount and/or Complexity of Data Reviewed Labs: ordered. ECG/medicine tests: ordered.  Risk Prescription drug management.  His EKG for interpretation shows no signs of acute coronary ischemia 11:16 AM Discussed case with Dr. Althia Forts from cardiology.  Recommends echocardiogram and they will see  4:06 PM Patient was seen by cardiology and they reviewed the patient ultrasound and noted that the pericardial effusion is very small.  Does not require treatment at this time.  I spoke with Dr. Katharina Caper who states patient stable for discharge.        Final Clinical Impression(s) / ED Diagnoses Final diagnoses:  None    Rx / DC Orders ED Discharge Orders     None         Lacretia Leigh, MD 11/19/22 1607

## 2022-11-19 NOTE — Progress Notes (Signed)
Received call from CT.  Patient undergoing coronary CTA today and noted by CT tech to have significant pleural and pericardial effusion.  I reviewed images, has moderate pericardial effusion and moderate (R>L) bilateral pleural effusions.  I spoke with patient, he reports has had recent pneumonia for which he completed 2 courses of antibiotics and symptoms have improved but remains short of breath.  Recommend going to ED.  Would recommend echo to evaluate pericardial effusion.  Patient is agreeable to this, he is at [facility] for CT and will go to the ED

## 2022-11-19 NOTE — Consult Note (Signed)
Cardiology Consultation   Patient ID: KANE KUSEK MRN: [id number]; DOB: Jul 19, 1956  Admit date: 11/19/2022 Date of Consult: 11/19/2022  PCP:  Keane Scrape, [place] [facility] Cardiologist:  Glenetta Hew, MD     Patient Profile:   GLENDON DUNWOODY is a 66 y.o. male with a hx of HTN, HLD, and family history of premature CAD who is being seen 11/19/2022 for the evaluation of pericardial effusion at the request of Dr. Zenia Resides.  History of Present Illness:   Mr. Hengel is a 66 year old male with above medical history who is followed by Dr. Ellyn Hack. Per chart review, patient underwent CT cardiac scoring on 11/13/2018 that showed a coronary calcium score of 158 (72nd percentile). Because of this finding, patient was referred to cardiology. He was started on statin, aspirin, and losartan for BP management. HR too low to be on BB. Repeat CT cardiac scoring on 06/02/2022 showed a coronary calcium score of 440 (81st percentile), mild dilation ascending aorta measuring approximately 40 mm.  Study also noted a 5 mm right lower lobe nodule, nonspecific but statistically benign.  Recommended no follow-up needed if patient is low risk.  Patient was most recently seen by Dr. Ellyn Hack on 07/20/2022.  At that time, patient noticed that he was getting out of breath more easily than usual.  However, the more he exercise/the better shape he got in, he felt less dyspneic.  He denied having chest pain or pressure while at rest or on exertion.  BP was well-controlled.  Patient messaged Dr. Ellyn Hack on 10/21/2022 reporting that for the past 10 weeks, he has been having respiratory issues.  He had been diagnosed with a viral infection followed by pneumonia and was treated with antibiotics.  He went to the ED in [place] on 10/23 complaining of chest pain.  Heart attack ruled out.  They recommended stress test.  Dr. Ellyn Hack recommended coronary CT as this would give more information about the extent of  cholesterol plaque.  Patient agreed and was scheduled for coronary CT on 11/19/2022.  Patient presented to [facility] on 12/1 with plans of undergoing coronary CTA.  CT tech noted that patient had significant pleural and pericardial effusions.  Official CT read from radiology noted moderate bilateral pleural effusions with adjacent subsegmental atelectasis of both lower lobes, small pericardial effusion.  Also noted 4.1 cm ascending thoracic aortic aneurysm.  Dr. Gardiner Rhyme (reading doc) called the pericardial effusion moderate on his report. Patient was sent to the ED for echocardiogram.  Also noted that coronary CT showed a coronary calcium score of 44 (82nd percentile), moderate stenosis in mid LAD, mild stenosis in proximal LAD and proximal left circumflex caused by calcified plaque, and minimal stenosis in proximal RCA and distal RCA caused by calcified plaque.  Study was sent for [facility] which suggested nonobstructive CAD.   Patient arrived in the ED on 12/1. Vital signs in triage showed BP 111/76, HR 65 BPM, respirations 21 breaths per minute, O2 saturations 97% on room air. Labs showed Na 136, K 4.2, creatinine 0.79, hemoglobin 12.6, WBC 5.1, platelets 13.2. EKG showed normal sinus rhythm, heart rate 70 bpm, low voltage QRS complexes (QRS complexes similar to EKG from 04/2022)     Past Medical History:  Diagnosis Date   Cancer [facility])    prostate CA- bx done 11-18, hasn't started treatment   Coronary artery calcification seen on CAT scan 05/24/2022   Coronary Calcium Score (10/2018)- 158.  Mild Calcium  in all 3 Epicardial Coronaries.  Slight TA dilation ~4 cm.; 05/2022: Coronary Calcium Score: 440. Left main: , LAD 261, LCx 67, RCA 109.  81st percentile.  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   Hyperlipidemia    Rosuvastatin just ordered, but not yet started.   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 [name], right   LYMPHADENECTOMY Bilateral 07/28/2020   Procedure: LYMPHADENECTOMY, PELVIC;  Surgeon: [name], MD;  Location: [facility] ORS;  Service: Urology;  Laterality: Bilateral;   PROSTATE BIOPSY     November 2018   ROBOT ASSISTED LAPAROSCOPIC RADICAL PROSTATECTOMY N/A 07/28/2020   Procedure: XI ROBOTIC ASSISTED LAPAROSCOPIC RADICAL PROSTATECTOMY LEVEL 2;  Surgeon: [name], MD;  Location: [facility] ORS;  Service: Urology;  Laterality: N/A;     Home Medications:  Prior to Admission medications   Medication Sig Start Date End Date Taking? Authorizing Provider  losartan (COZAAR) 25 MG tablet Take 1 tablet (25 mg total) by mouth daily. 11/24/21   Marin Olp, MD  metoprolol tartrate (LOPRESSOR) 50 MG tablet Take 1 tablet (50 mg total) by mouth once for 1 dose. TAKE TWO HOURS PRIOR TO  SCHEDULE CARDIAC TEST 11/09/22 11/09/22  Leonie Man, MD  rosuvastatin (CRESTOR) 20 MG tablet Take 1 tablet (20 mg total) by mouth daily. 11/24/21   Marin Olp, MD  tadalafil (CIALIS) 5 MG tablet 1 times per day    [provider]  traZODone (DESYREL) 50 MG tablet Take 0.5-1 tablets (25-50 mg total) by mouth at bedtime as needed for sleep. 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 2 kids- son in [place] and daughter in [place] with 2 grandkids age 74 and 72 in 2022.       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   Social Determinants of Health   Financial Resource Strain: Not on file  Food Insecurity: Not on file  Transportation Needs: Not on file  Physical Activity: Not on file  Stress: Not on file  Social Connections: Not on file  Intimate Partner Violence: Not on file    Family History:    Family History  Problem Relation Age of Onset   Heart disease Father        25s heavy smoker   Prostate cancer Father        47s or 36s   Hyperlipidemia Father    Diabetes Mother    Cancer Mother 13       lung cancer-nonsmoker but 2nd hand smoke   Lung cancer Mother        died at 31   Heart disease Brother    Colon cancer Neg Hx    Esophageal cancer Neg Hx    Rectal cancer Neg Hx    Stomach cancer Neg Hx    Breast cancer Neg Hx      ROS:  Please see the history of present illness.   All other ROS reviewed and negative.     Physical Exam/Data:   Vitals:   11/19/22 1200 11/19/22 1215 11/19/22 1229 11/19/22 1230  BP: 123/85 117/80 117/80   Pulse: 62 65 66 64  Resp: '19 19  20  '$ Temp:   98.5 F (36.9 C)   TempSrc:   Oral   SpO2: 97% 97% 99% 98%  Weight:      Height:       No intake or output data in the 24 hours ending 11/19/22 1344    11/19/2022    8:54 AM 07/20/2022   11:23 AM 06/02/2022   10:05 AM  Last 3 Weights  Weight (lbs) 170 lb 181 lb 3.2 oz 181 lb  Weight (kg) 77.111 kg  82.192 kg 82.101 kg     Body mass index is 25.85 kg/m.  General:  Well nourished, well developed, in no acute distress HEENT: normal Neck: no JVD Vascular: No carotid bruits; Distal pulses 2+ bilaterally Cardiac:  normal S1, S2; RRR; no murmur  Lungs:  clear in upper lung fields,  diminished breath sounds in the bases  Abd: soft, nontender, no hepatomegaly  Ext: no edema Musculoskeletal:  No deformities, BUE and BLE strength normal and equal Skin: warm and dry  Neuro:  CNs 2-12 intact, no focal abnormalities noted Psych:  Normal affect   EKG:  The EKG was personally reviewed and demonstrates:  QRS complexes similar to EKG from 04/2022  Telemetry:  NSR    Relevant CV Studies:  CT Coronary 11/19/22 4.1 cm ascending thoracic aortic aneurysm. Recommend annual imaging followup by CTA or MRA. This recommendation follows 2010 ACCF/AHA/AATS/ACR/ASA/SCA/SCAI/SIR/STS/SVM Guidelines for the Diagnosis and Management of Patients with Thoracic Aortic Disease. Circulation. 2010; 121: B341-P379. Aortic aneurysm NOS (ICD10-I71.9).   Moderate bilateral pleural effusions are noted with adjacent subsegmental atelectasis of both lower lobes.   Small pericardial effusion.  IMPRESSION: 1. Coronary calcium score of 484. This was 82nd percentile for age and sex matched control.   2.  Normal coronary origin with right dominance.   3. Noncalcified plaque in mid LAD causes moderate (50-69%) stenosis. Will send study for CTFFR   4. Calcified plaque causes mild (24-59%) stenosis in proximal LAD and proximal LCX. Calcified plaque causes minimal (0-24%) stenosis in proximal RCA and distal RCA   5.  Moderate pericardial effusion   6.  Dilated ascending aorta measuring 103m   7.  Dilated main pulmonary artery measuring 332m  8.  See radiology addendum for description of noncardiac findings   CAD-RADS 3. Moderate stenosis. Consider symptom-guided anti-ischemic pharmacotherapy as  well as risk  factor modification per guideline directed care. Additional analysis with CT FFR will be submitted.  CTFFR suggests nonobstructive CAD   Laboratory Data:  High Sensitivity Troponin:  No results for input(s): "TROPONINIHS" in the last 720 hours.   Chemistry Recent Labs  Lab 11/19/22 0947  NA 136  K 4.2  CL 103  CO2 26  GLUCOSE 97  BUN 7*  CREATININE 0.79  CALCIUM 8.8*  GFRNONAA >60  ANIONGAP 7    Recent Labs  Lab 11/19/22 0947  PROT 6.3*  ALBUMIN 2.9*  AST 20  ALT 21  ALKPHOS 62  BILITOT 0.3   Lipids No results for input(s): "CHOL", "TRIG", "HDL", "LABVLDL", "LDLCALC", "CHOLHDL" in the last 168 hours.  Hematology Recent Labs  Lab 11/19/22 0947  WBC 5.1  RBC 4.35  HGB 12.6*  HCT 39.8  MCV 91.5  MCH 29.0  MCHC 31.7  RDW 13.2  PLT 323   Thyroid No results for input(s): "TSH", "FREET4" in the last 168 hours.  BNP Recent Labs  Lab 11/19/22 0947  BNP 48.8    DDimer No results for input(s): "DDIMER" in the last 168 hours.   Radiology/Studies:  CT CORONARY MORPH W/CTA COR W/SCORE W/CA W/CM &/OR WO/CM  Addendum Date: 11/19/2022   ADDENDUM REPORT: 11/19/2022 10:51 EXAM: OVER-READ INTERPRETATION  CT CHEST The following report is an over-read performed by radiologist Dr. Sabino Dick [facility] on 11/19/2022. This over-read does not include interpretation of cardiac or coronary anatomy or pathology. The coronary CTA interpretation by the cardiologist is attached. COMPARISON:  June 02, 2022. FINDINGS: 4.1 cm ascending thoracic aortic aneurysm is noted. Small pericardial effusion is noted. Visualized mediastinum is unremarkable. Moderate bilateral pleural effusions are noted with adjacent subsegmental atelectasis of both lower lobes. Visualized skeleton is unremarkable. IMPRESSION: 4.1 cm ascending thoracic aortic aneurysm. Recommend annual imaging followup by CTA or MRA. This recommendation follows 2010 ACCF/AHA/AATS/ACR/ASA/SCA/SCAI/SIR/STS/SVM  Guidelines for the Diagnosis and Management of Patients with Thoracic Aortic Disease. Circulation. 2010; 121: T419-Q222. Aortic aneurysm NOS (ICD10-I71.9). Moderate bilateral pleural effusions are noted with adjacent subsegmental atelectasis of both lower lobes. Small pericardial effusion. Electronically Signed   By: Marijo Conception M.D.   On: 11/19/2022 10:51   Result Date: 11/19/2022 CLINICAL DATA:  52M with chest pain EXAM: Cardiac/Coronary CTA TECHNIQUE: The patient was scanned on a [employer]. FINDINGS: A 100 kV prospective scan was triggered in the descending thoracic aorta at 111 HU's. Axial non-contrast 3 mm slices were carried out through the heart. The data set was analyzed on a dedicated work station and scored using the [place]. Gantry rotation speed was 250 msecs and collimation was .6 mm. No beta blockade and 0.8 mg of sl NTG was given. The 3D data set was reconstructed in 5% intervals of the 35-75% of the R-R cycle. Phases were analyzed on a dedicated work station using MPR, MIP and VRT modes. The patient received 100 cc of contrast. Coronary Arteries:  Normal coronary origin.  Right dominance. RCA is a large dominant artery that gives rise to PDA and PLA. Calcified plaque in proximal RCA causes 0-24% stenosis. Calcified plaque in distal RCA causes 0-24% stenosis Left main is a large artery that gives rise to LAD and LCX arteries. LAD is a large vessel. Calcified plaque in proximal LAD causes 25-49% stenosis. Noncalcified plaque in mid LAD causes 50-69% stenosis LCX is a non-dominant artery that gives rise to one large OM1 branch. Calcified plaque in  proximal LCX causes 25-49% stenosis Other findings: Left Ventricle: Normal size Left Atrium: Normal size Pulmonary Veins: Normal configuration Right Ventricle: Mild enlargement Right Atrium: Mild enlargement Cardiac valves: No calcifications Thoracic aorta: Dilated ascending aorta measuring 81m Pulmonary Arteries: Dilated main  pulmonary artery measuring 342mSystemic Veins: Normal drainage Pericardium: Moderate pericardial effusion IMPRESSION: 1. Coronary calcium score of 484. This was 82nd percentile for age and sex matched control. 2.  Normal coronary origin with right dominance. 3. Noncalcified plaque in mid LAD causes moderate (50-69%) stenosis. Will send study for CTFFR 4. Calcified plaque causes mild (24-59%) stenosis in proximal LAD and proximal LCX. Calcified plaque causes minimal (0-24%) stenosis in proximal RCA and distal RCA 5.  Moderate pericardial effusion 6.  Dilated ascending aorta measuring 4062m.  Dilated main pulmonary artery measuring 51m23m  See radiology addendum for description of noncardiac findings CAD-RADS 3. Moderate stenosis. Consider symptom-guided anti-ischemic pharmacotherapy as well as risk factor modification per guideline directed care. Additional analysis with CT FFR will be submitted. Electronically Signed: By: ChriOswaldo Milian. On: 11/19/2022 10:35   CT CORONARY FFR DATA PREP & FLUID ANALYSIS  Result Date: 11/19/2022 EXAM: FFRCT ANALYSIS FINDINGS: FFRct analysis was performed on the original cardiac CT angiogram dataset. Diagrammatic representation of the FFRct analysis is provided in a separate PDF document in PACS. This dictation was created using the PDF document and an interactive 3D model of the results. 3D model is not available in the EMR/PACS. Normal FFR range is >0.80. 1. Left Main: No significant stenosis 2. LAD: CTFFR 0.91 across lesion in proximal LAD, suggesting lesion is not hemodynamically significant. CTFFR falls to 0.81 across lesion in mid LAD, suggesting lesion is not hemodynamically significant 3. LCX: No significant stenosis 4. RCA: No significant stenosis IMPRESSION: 1.  CTFFR suggests nonobstructive CAD Electronically Signed   By: ChriOswaldo Milian.   On: 11/19/2022 10:47     Assessment and Plan:   Pericardial effusion: he was thought to have a moderate  sized pericardial effusion by CT.   Echo shows the pericardial effusion to be small.   There is no evidence of cardiac tamponade.   He is not having any chest pain .  No specific follow needed for this   2.  Pleural effusions:  no CXR done in ER .   Echo shows a large Left pleural effusion.  By exam he has bilat pleural effusions .  These have persisted 3 months after his pneumonia .  Will refer to pulmonary for further evaluation   3. CAD: Coronary calcium score of 484. This was 82nd percentile for age and sex matched control. RCA - mild disease LM - normal  LAD - mild - moderate disease  LCx   non - dominate vessel.  Mild - mod cad  He has a strong family hx of CAD . Given his CAD, I think his LDL goal should be <55.   I have suggested that he have a Lp(a) drawn.    If his Lp(a) is elevated, he would benefit from a PCSK9 inhibitor . He will have this drawn in his home town and will discuss with Dr. HardEllyn Hackhis next office visit    4.  Ascending aortic aneurism:  Asc. Aorta measures 4.1 cm.   Recommend CTA of the aorta in 1 year for recheck   Risk Assessment/Risk Scores:             For questions or updates, please contact [facility] Please consult [email or web address] for contact info under    Signed, Margie Billet, PA-C  11/19/2022 1:44 PM

## 2022-11-19 NOTE — Progress Notes (Signed)
Patient taken to ED per Dr. Gardiner Rhyme request. Patient and wife agreeable with plan discussed by Dr. Gardiner Rhyme.

## 2022-11-19 NOTE — Progress Notes (Incomplete)
Echocardiogram 2D Echocardiogram has been performed.  Patrick [name] 11/19/2022, 3:12 PM

## 2022-11-19 NOTE — ED Triage Notes (Signed)
Pt arrived POV from getting an outpatient CT and was told to come over here by the cardiologist who read his scan and states he has fluid around his heart and lungs. Pt states he has been dealing with respiratory issues and pneumonia for a few weeks now.

## 2022-11-20 NOTE — Telephone Encounter (Signed)
He was seen by Dr. Acie Fredrickson --> discussed his CT & Echo results --> referred to [place] for pleural effusions.   We can discuss CRF modification - including lipid management (checking LPa).    CTA did not show "obstructive" lesions by CT FFR- but did indeed show evidence of CAD (moderate LAD, mild-moderate RCA).    This level of data is why I opted for Cor CTA over ST.  The finding of Pleural Effusion was unexpected - but may very well explain his "SOB & Chest Discomfort".    [place]

## 2022-11-20 NOTE — Telephone Encounter (Signed)
He was seen by Dr. Acie Fredrickson --> discussed his CT & Echo results --> referred to [place] for pleural effusions.    We can discuss CRF modification - including lipid management (checking LPa).     CTA did not show "obstructive" lesions by CT FFR- but did indeed show evidence of CAD (moderate LAD, mild-moderate RCA).     This level of data is why I opted for Cor CTA over ST.   The finding of Pleural Effusion was unexpected - but may very well explain his "SOB & Chest Discomfort".     [place]

## 2022-11-30 ENCOUNTER — Encounter: Payer: No Typology Code available for payment source | Admitting: Family Medicine

## 2022-12-02 ENCOUNTER — Encounter: Payer: Self-pay | Admitting: *Deleted

## 2022-12-06 ENCOUNTER — Telehealth: Payer: Self-pay | Admitting: Family Medicine

## 2022-12-06 NOTE — Telephone Encounter (Signed)
He's still an active patient since seen less than a year- he can be scheduled for CPE if his insurance will allow. Has he been on part B under a year and can get a visit scheduled in under a year from when he started? Then could be welcome to medicare. Otherwise needs to be just a follow up visit.

## 2022-12-06 NOTE — Telephone Encounter (Signed)
What would you like to schedule this as?

## 2022-12-06 NOTE — Telephone Encounter (Signed)
I am willing to accept for sure...but want to be honest can be tough with acute needs as well as setting up referrals etc in area we are not familiar with.

## 2022-12-06 NOTE — Telephone Encounter (Signed)
Patient states he would like to have Dr. Yong Channel as his PCP again. States it is not working out with Dr. Conley Canal.  States the only reason he started seeing Dr. Conley Canal is because it is so mush closer to Patient's home, however, states he would rather travel to have Dr. Yong Channel as his PCP.  Requests to be called if any questions.  Please advise.

## 2022-12-06 NOTE — Telephone Encounter (Signed)
Does this patient need to be sch for a TOC visit? Or just change PCP.-  He was last seen for a CPE on 11/24/2021-   Thanks!

## 2022-12-06 NOTE — Telephone Encounter (Signed)
See below

## 2022-12-07 NOTE — Telephone Encounter (Signed)
See below

## 2022-12-08 ENCOUNTER — Ambulatory Visit (INDEPENDENT_AMBULATORY_CARE_PROVIDER_SITE_OTHER): Payer: Medicare Other | Admitting: Pulmonary Disease

## 2022-12-08 ENCOUNTER — Ambulatory Visit (INDEPENDENT_AMBULATORY_CARE_PROVIDER_SITE_OTHER): Payer: Medicare Other

## 2022-12-08 ENCOUNTER — Encounter: Payer: Self-pay | Admitting: Pulmonary Disease

## 2022-12-08 VITALS — BP 152/88 | HR 69 | Temp 98.1°F | Ht 68.0 in | Wt 173.6 lb

## 2022-12-08 DIAGNOSIS — J90 Pleural effusion, not elsewhere classified: Secondary | ICD-10-CM

## 2022-12-08 DIAGNOSIS — I3139 Other pericardial effusion (noninflammatory): Secondary | ICD-10-CM

## 2022-12-08 DIAGNOSIS — I2510 Atherosclerotic heart disease of native coronary artery without angina pectoris: Secondary | ICD-10-CM | POA: Diagnosis not present

## 2022-12-08 NOTE — Progress Notes (Unsigned)
Synopsis: Referred in December 2023 for bilateral pleural effusion seen on chest imaging while undergoing cardiac workup  Subjective:   PATIENT ID: Patrick [name] GENDER: male DOB: [birth year], MRN: [id number]   HPI  Chief Complaint  Patient presents with   Consult    Ct, echo 11/19/22    Patrick [name] is here to see me for the new finding of fluid around his lungs and heart that was found on a cardiac workup. He says that he started feeling sick around Labor Day and felt like he caught a virus.  He was diagnosed with pneumonia based on a CXR, was treated with antibiotics and steroids, felt better.  He had improvement until he went to the [place] in October he developed chest pain.  This was fairly severe, he went to the ER and had an elevated d-dimer. He had a a CT chest that showed no blood clot, but he had effusions  he had worsening pain and constriction that worsened over the next four weeks. He was treated with antibiotics, no steroids.  He was seeing his PCP and cardiology during this time.  Cardiology recommended a CT angiogram that showed the findings below.  He says that after the October ER visit his energy levels were really low, he couldn't get warm, his oxygen level would bounce around the mid to low 90's, his HR would go up and down some.  He says that he lost 15 pounds, and has not felt like eating much but that has improved.  He had severe myalgias at the time in his back and shoulders.    He's tested himself for COVID at home 3-4 times during this time.  He didn't have other tests for infectious problems.  He traveled to [place] around labor day but he was sick with coughing then.    He's now feeling a lot better.   He has some should pain, no distinct joint pain or swelling, no rash. Record review: Patient was seen by cardiology this year.  Initially he was seen in 2019 after a calcium scoring CT scan showed calcium levels in the 74th percentile range.  He has  been followed by Dr. Ellyn Hack with medical management.  In 2023 scoring showed 81st percentile level calcification of his coronary vasculature so he was scheduled for a CTA of his coronaries.  At that procedure he was noted to have what was felt to be a moderate pericardial effusion and bilateral pleural effusions so he was sent to the emergency department.  There he had an echocardiogram which showed that the pericardial effusion was small.  Chest imaging did confirm bilateral pleural effusions.  The patient was discharged home and referred to pulmonary for further evaluation.  Past Medical History:  Diagnosis Date   Cancer [facility])    prostate CA- bx done 11-18, hasn't started treatment   Coronary artery calcification seen on CAT scan 05/24/2022   Coronary Calcium Score (10/2018)- 158.  Mild Calcium in all 3 Epicardial Coronaries.  Slight TA dilation ~4 cm.; 05/2022: Coronary Calcium Score: 440. Left main: , LAD 261, LCx 67, RCA 109.  81st percentile.  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   Hyperlipidemia    Rosuvastatin just ordered, but not yet started.   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   Qualifier: Diagnosis of  By: [name] MD, [name]    Prostate cancer [facility])      Family History  Problem Relation Age of Onset  Heart disease Father        32s heavy smoker   Prostate cancer Father        69s or 41s   Hyperlipidemia Father    Diabetes Mother    Cancer Mother 85       lung cancer-nonsmoker but 2nd hand smoke   Lung cancer Mother        died at 84   Heart disease Brother    Colon cancer Neg Hx    Esophageal cancer Neg Hx    Rectal cancer Neg Hx    Stomach cancer Neg Hx    Breast cancer Neg Hx      Social History   Socioeconomic History   Marital status: Married    Spouse name: Not on file   Number of children: Not on file   Years of education: Not on file   Highest education level: Not on file  Occupational History   Occupation:  [phone]    Employer: [employer]   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 2 kids- son in [place] and daughter in [place] with 2 grandkids age 86 and 43 in 2022.       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   Social Determinants of Health   Financial Resource Strain: Not on file  Food Insecurity: Not on file  Transportation Needs: Not on file  Physical Activity: Not on file  Stress: Not on file  Social Connections: Not on file  Intimate Partner Violence: Not on file     No Known Allergies   Outpatient Medications Prior to Visit  Medication Sig Dispense Refill   losartan (COZAAR) 25 MG tablet Take 1 tablet (25 mg total) by mouth daily. 90 tablet 3   rosuvastatin (CRESTOR) 20 MG tablet Take 1 tablet (20 mg total) by mouth daily. 90 tablet 3   tadalafil (CIALIS) 5 MG tablet 1 times per day     traZODone (DESYREL) 50 MG tablet Take 0.5-1 tablets (25-50 mg total) by mouth at bedtime as needed for sleep. 90 tablet 3   metoprolol tartrate (LOPRESSOR) 50 MG tablet Take 1 tablet (50 mg total) by mouth once for 1 dose. TAKE TWO HOURS PRIOR TO  SCHEDULE CARDIAC TEST 1 tablet 0   No facility-administered medications prior to visit.    Review of Systems  Constitutional:  Positive for malaise/fatigue and weight loss. Negative for chills and fever.  HENT:  Negative for congestion, nosebleeds, sinus pain and sore throat.   Eyes:  Negative for photophobia, pain and discharge.   Respiratory:  Positive for cough. Negative for hemoptysis, sputum production, shortness of breath and wheezing.   Cardiovascular:  Negative for chest pain, palpitations, orthopnea and leg swelling.  Gastrointestinal:  Negative for abdominal pain, constipation, diarrhea, nausea and vomiting.  Genitourinary:  Negative for dysuria, frequency, hematuria and urgency.  Musculoskeletal:  Negative for back pain, joint pain, myalgias and  neck pain.  Skin:  Negative for itching and rash.  Neurological:  Negative for tingling, tremors, sensory change, speech change, focal weakness, seizures, weakness and headaches.  Psychiatric/Behavioral:  Negative for memory loss, substance abuse and suicidal ideas. The patient is not nervous/anxious.       Objective:  Physical Exam   Vitals:   12/08/22 0848  BP: (!) 152/88  Pulse: 69  Temp: 98.1 F (36.7 C)  TempSrc: Oral  SpO2: 99%  Weight: 173 lb 9.6 oz (78.7 kg)  Height: '5\' 8"'$  (1.727 m)    Gen: well appearing, no acute distress HENT: NCAT, OP clear, neck supple without masses Eyes: PERRL, EOMi Lymph: no cervical lymphadenopathy PULM: CTA B, normal percussion CV: RRR, no mgr, no JVD GI: BS+, soft, nontender, no hsm Derm: no rash or skin breakdown MSK: normal bulk and tone Neuro: A&Ox4, CN II-XII intact, strength 5/5 in all 4 extremities Psyche: normal mood and affect   CBC    Component Value Date/Time   WBC 5.1 11/19/2022 0947   RBC 4.35 11/19/2022 0947   HGB 12.6 (L) 11/19/2022 0947   HCT 39.8 11/19/2022 0947   PLT 323 11/19/2022 0947   MCV 91.5 11/19/2022 0947   MCH 29.0 11/19/2022 0947   MCHC 31.7 11/19/2022 0947   RDW 13.2 11/19/2022 0947   LYMPHSABS 0.7 11/19/2022 0947   MONOABS 0.5 11/19/2022 0947   EOSABS 0.1 11/19/2022 0947   BASOSABS 0.0 11/19/2022 0947     Chest imaging: November 19, 2022 CTA coronary heart lung windows images reviewed: Bilateral pleural effusions right greater than left atelectasis  basis  PFT:  Labs:  Path:  Echo: December 2023 transthoracic echocardiogram LVEF 55 to 60%, mild LVH, grade 1 diastolic dysfunction, RV size and function normal, small pericardial effusion, large pleural effusion left lateral region, valves okay ascending aorta dilated 41 mm  Heart Catheterization:       Assessment & Plan:   Pleural effusion, bilateral - Plan: Coxsackie A virus antibodies, Coxsackie B virus antibodies, Antinuclear Antib (ANA), ANA+ENA+DNA/DS+Scl 24+OXBDZH/G, Cyclic citrul peptide antibody, IgG, Legionella Antigen, Urine, DG Chest 2 View  Pericardial effusion  Discussion: Patrick [name] presents with fatigue malaise several months onset with associated respiratory complaints of cough, chest pain and shortness of breath with the objective findings of bilateral pleural effusions and pericardial effusion.  Over the last several weeks he has improved significantly and today his physical exam is nearly completely normal.  He may have a trace pleural effusion but it is difficult for me to say that he has a large pleural effusion at this point based on his exam.  Under normal circumstances I would send him for thoracentesis but I am not sure if there is fluid left at this point.  Will get a chest x-ray today.  If there is still fluid there then he certainly needs a thoracentesis to assess for inflammatory, infectious, malignant causes.  The differential diagnosis of whatever syndrome led to this is broad and as he seems to be making improvement I am hopeful that it something transient like a coxsackievirus.  However, given his age range and the weight loss he is experienced we need to keep malignant, inflammatory including autoimmune causes in mind.  Plan: Bilateral pleural effusions with associated pericardial effusion: Clinically improving Chest x-ray today Serum coxsackievirus IgG Autoimmune lab workup: ANA, double-stranded DNA, CCP antibody Urine Legionella antigen If there is  still pleural effusions then we will arrange for a thoracentesis  Will plan on bringing you  back in 1 month and repeating a chest x-ray, if you have recurrent symptoms please let me know.  Immunizations: Immunization History  Administered Date(s) Administered   Influenza Split 10/25/2011, 11/03/2012   Influenza Whole 10/08/2008, 09/23/2009, 09/08/2010   Influenza,inj,Quad PF,6+ Mos 11/09/2013, 10/14/2014, 08/22/2015, 09/16/2017, 10/27/2018, 10/08/2019, 11/21/2020, 10/28/2021   Influenza-Unspecified 09/14/2016   PFIZER(Purple Top)SARS-COV-2 Vaccination 03/06/2020, 03/27/2020, 10/07/2020   Td 02/17/2005   Tdap 06/09/2015   Zoster Recombinat (Shingrix) 07/13/2017, 09/16/2017     Current Outpatient Medications:    losartan (COZAAR) 25 MG tablet, Take 1 tablet (25 mg total) by mouth daily., Disp: 90 tablet, Rfl: 3   rosuvastatin (CRESTOR) 20 MG tablet, Take 1 tablet (20 mg total) by mouth daily., Disp: 90 tablet, Rfl: 3   tadalafil (CIALIS) 5 MG tablet, 1 times per day, Disp: , Rfl:    traZODone (DESYREL) 50 MG tablet, Take 0.5-1 tablets (25-50 mg total) by mouth at bedtime as needed for sleep., Disp: 90 tablet, Rfl: 3   metoprolol tartrate (LOPRESSOR) 50 MG tablet, Take 1 tablet (50 mg total) by mouth once for 1 dose. TAKE TWO HOURS PRIOR TO  SCHEDULE CARDIAC TEST, Disp: 1 tablet, Rfl: 0

## 2022-12-08 NOTE — Patient Instructions (Signed)
Bilateral pleural effusions with associated pericardial effusion: Clinically improving Chest x-ray today Serum coxsackievirus IgG Autoimmune lab workup: ANA, double-stranded DNA, CCP antibody Urine Legionella antigen If there is still pleural effusions then we will arrange for a thoracentesis  Will plan on bringing you back in 1 month and repeating a chest x-ray, if you have recurrent symptoms please let me know.

## 2022-12-09 ENCOUNTER — Encounter: Payer: Self-pay | Admitting: Pulmonary Disease

## 2022-12-11 LAB — COXSACKIE B VIRUS ANTIBODIES
Coxsackie B1 Ab: 1:8 {titer} — ABNORMAL HIGH
Coxsackie B2 Ab: NEGATIVE
Coxsackie B4 Ab: 1:8 {titer} — ABNORMAL HIGH
Coxsackie B5 Ab: NEGATIVE
Coxsackie B6 Ab: NEGATIVE

## 2022-12-11 LAB — ANA+ENA+DNA/DS+SCL 70+SJOSSA/B
ANA Titer 1: NEGATIVE
ENA RNP Ab: <0.2 AI (ref 0.0–0.9)
ENA SM Ab Ser-aCnc: <0.2 AI (ref 0.0–0.9)
ENA SSA (RO) Ab: <0.2 AI (ref 0.0–0.9)
ENA SSB (LA) Ab: <0.2 AI (ref 0.0–0.9)
Scleroderma (Scl-70) (ENA) Antibody, IgG: <0.2 AI (ref 0.0–0.9)
dsDNA Ab: <1 IU/mL (ref 0–9)

## 2022-12-13 LAB — LEGIONELLA ANTIGEN, URINE: Legionella Antigen, Urine: NOT DETECTED

## 2022-12-15 LAB — COXSACKIE A VIRUS ANTIBODIES
Coxsackie A10 Ab: <1:8 {titer}
Coxsackie A16 Ab: <1:8 {titer}
Coxsackie A2 Ab: <1:8 {titer}
Coxsackie A4 Ab: <1:8 {titer}
Coxsackie A7 Ab: <1:8 {titer}
Coxsackie A9 Ab: <1:8 {titer}

## 2022-12-15 LAB — CYCLIC CITRUL PEPTIDE ANTIBODY, IGG: Cyclic Citrullin Peptide Ab: <16 UNITS

## 2022-12-15 LAB — ANTI-NUCLEAR AB-TITER (ANA TITER): ANA Titer 1: 1:40 {titer} — ABNORMAL HIGH

## 2022-12-15 LAB — ANA: Anti Nuclear Antibody (ANA): POSITIVE — AB

## 2023-01-05 ENCOUNTER — Other Ambulatory Visit: Payer: Self-pay

## 2023-01-05 ENCOUNTER — Encounter: Payer: Self-pay | Admitting: Family Medicine

## 2023-01-05 MED ORDER — ROSUVASTATIN CALCIUM 20 MG PO TABS: 20.0000 mg | ORAL_TABLET | Freq: Every day | ORAL | 3 refills | Status: DC

## 2023-01-05 MED ORDER — LOSARTAN POTASSIUM 25 MG PO TABS: 25.0000 mg | ORAL_TABLET | Freq: Every day | ORAL | 3 refills | Status: DC

## 2023-01-13 NOTE — Progress Notes (Signed)
Synopsis: Referred in December 2023 for bilateral pleural effusion seen on chest imaging while undergoing cardiac workup  Subjective:   PATIENT ID: Patrick [name] GENDER: male DOB: 1956-11-23, MRN: [id number]   HPI  Chief Complaint  Patient presents with   Follow-up    Pt states no new issues since LOV.   He's been doing OK since the last visit No chest pain or dyspnea No trouble breathing General he feels recovered.  Past Medical History:  Diagnosis Date   Cancer [facility])    prostate CA- bx done 11-18, hasn't started treatment   Coronary artery calcification seen on CAT scan 05/24/2022   Coronary Calcium Score (10/2018)- 158.  Mild Calcium in all 3 Epicardial Coronaries.  Slight TA dilation ~4 cm.; 05/2022: Coronary Calcium Score: 440. Left main: , LAD 261, LCx 67, RCA 109.  81st percentile.  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   Hyperlipidemia    Rosuvastatin just ordered, but not yet started.   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   Qualifier: Diagnosis of  By: [name] MD, [name]    Prostate cancer [facility])       Review of Systems  Constitutional:  Negative for chills, fever, malaise/fatigue and weight loss.  HENT:  Negative for congestion, sinus pain and sore throat.   Respiratory:  Negative for cough, sputum production and shortness of breath.   Cardiovascular:  Negative for chest pain and leg swelling.      Objective:  Physical Exam   Vitals:   01/14/23 0828  BP: 128/72  Pulse: 61  Temp: 99.1 F (37.3 C)  TempSrc: Oral  SpO2: 99%  Weight: 172 lb 12.8 oz (78.4 kg)  Height: '5\' 8"'$  (1.727 m)    Gen: well appearing HENT: OP clear, neck supple PULM: CTA B, normal effort  CV: RRR, no mgr GI: BS+, soft, nontender Derm: no cyanosis or rash Psyche: normal mood and affect    CBC    Component Value Date/Time   WBC 5.1 11/19/2022 0947   RBC 4.35 11/19/2022 0947   HGB 12.6 (L) 11/19/2022 0947   HCT 39.8 11/19/2022  0947   PLT 323 11/19/2022 0947   MCV 91.5 11/19/2022 0947   MCH 29.0 11/19/2022 0947   MCHC 31.7 11/19/2022 0947   RDW 13.2 11/19/2022 0947   LYMPHSABS 0.7 11/19/2022 0947   MONOABS 0.5 11/19/2022 0947   EOSABS 0.1 11/19/2022 0947   BASOSABS 0.0 11/19/2022 0947     Chest imaging: November 19, 2022 CTA coronary heart lung windows images reviewed: Bilateral pleural effusions right greater than left atelectasis basis  PFT:  Labs: December 2023 ANA negative, SSA/SSB negative, coxsackievirus antibodies positive  Path:  Echo: December 2023 transthoracic echocardiogram LVEF 55 to 60%, mild LVH, grade 1 diastolic dysfunction, RV size and function normal, small pericardial effusion, large pleural effusion left lateral region, valves okay ascending aorta dilated 41 mm  Heart Catheterization:       Assessment & Plan:   Pleural effusion, bilateral - Plan: DG Chest 2 View  Coxsackievirus infection  Discussion: Patrick [name] had coxsackievirus leading to bilateral pleural effusions.  He is now recovered completely.  He has no evidence of any other underlying lung disease.  We talked about the impact of this.  He is interested in getting some vaccines as he has missed several over the last several months because of his illness.  Plan: Coxsackievirus pleuritis Repeat chest x-ray today to make sure there is no evidence  of recurrence of the pleural effusion At this time there should be no necessary follow-up unless you have recurrence of symptoms High-dose flu shot today  Follow-up as needed  Immunizations: Immunization History  Administered Date(s) Administered   Influenza Split 10/25/2011, 11/03/2012   Influenza Whole 10/08/2008, 09/23/2009, 09/08/2010   Influenza,inj,Quad PF,6+ Mos 11/09/2013, 10/14/2014, 08/22/2015, 09/16/2017, 10/27/2018, 10/08/2019, 11/21/2020, 10/28/2021   Influenza-Unspecified 09/14/2016   PFIZER(Purple Top)SARS-COV-2 Vaccination 03/06/2020, 03/27/2020, 10/07/2020    Td 02/17/2005   Tdap 06/09/2015   Zoster Recombinat (Shingrix) 07/13/2017, 09/16/2017     Current Outpatient Medications:    losartan (COZAAR) 25 MG tablet, Take 1 tablet (25 mg total) by mouth daily., Disp: 90 tablet, Rfl: 3   metoprolol tartrate (LOPRESSOR) 50 MG tablet, Take 1 tablet (50 mg total) by mouth once for 1 dose. TAKE TWO HOURS PRIOR TO  SCHEDULE CARDIAC TEST, Disp: 1 tablet, Rfl: 0   rosuvastatin (CRESTOR) 20 MG tablet, Take 1 tablet (20 mg total) by mouth daily., Disp: 90 tablet, Rfl: 3   tadalafil (CIALIS) 5 MG tablet, 1 times per day, Disp: , Rfl:    traZODone (DESYREL) 50 MG tablet, Take 0.5-1 tablets (25-50 mg total) by mouth at bedtime as needed for sleep., Disp: 90 tablet, Rfl: 3

## 2023-01-14 ENCOUNTER — Ambulatory Visit (INDEPENDENT_AMBULATORY_CARE_PROVIDER_SITE_OTHER): Payer: Medicare Other | Admitting: Pulmonary Disease

## 2023-01-14 ENCOUNTER — Ambulatory Visit (INDEPENDENT_AMBULATORY_CARE_PROVIDER_SITE_OTHER): Payer: Medicare Other

## 2023-01-14 ENCOUNTER — Encounter: Payer: Self-pay | Admitting: Pulmonary Disease

## 2023-01-14 VITALS — BP 128/72 | HR 61 | Temp 99.1°F | Ht 68.0 in | Wt 172.8 lb

## 2023-01-14 DIAGNOSIS — J90 Pleural effusion, not elsewhere classified: Secondary | ICD-10-CM | POA: Diagnosis not present

## 2023-01-14 DIAGNOSIS — Z23 Encounter for immunization: Secondary | ICD-10-CM | POA: Diagnosis not present

## 2023-01-14 DIAGNOSIS — B341 Enterovirus infection, unspecified: Secondary | ICD-10-CM | POA: Diagnosis not present

## 2023-01-14 NOTE — Patient Instructions (Signed)
Coxsackievirus pleuritis Repeat chest x-ray today to make sure there is no evidence of recurrence of the pleural effusion At this time there should be no necessary follow-up unless you have recurrence of symptoms High-dose flu shot today  Follow-up as needed

## 2023-01-24 ENCOUNTER — Encounter: Payer: Self-pay | Admitting: Family Medicine

## 2023-02-22 ENCOUNTER — Telehealth: Payer: Self-pay | Admitting: Family Medicine

## 2023-02-22 NOTE — Telephone Encounter (Signed)
Contacted [facility] Pope to schedule their annual wellness visit. Appointment made for 02/23/2023.  [place] Direct Dial [phone]

## 2023-02-23 ENCOUNTER — Ambulatory Visit (INDEPENDENT_AMBULATORY_CARE_PROVIDER_SITE_OTHER): Payer: Medicare Other

## 2023-02-23 VITALS — Wt 172.0 lb

## 2023-02-23 DIAGNOSIS — Z0000 Encounter for general adult medical examination without abnormal findings: Secondary | ICD-10-CM

## 2023-02-23 NOTE — Patient Instructions (Signed)
Patrick [name] , Thank you for taking time to come for your Medicare Wellness Visit. I appreciate your ongoing commitment to your health goals. Please review the following plan we discussed and let me know if I can assist you in the future.   These are the goals we discussed:  Goals      Patient Stated     None at this time         This is a list of the screening recommended for you and due dates:  Health Maintenance  Topic Date Due   Pneumonia Vaccine (1 of 1 - PCV) Never done   COVID-19 Vaccine (4 - 2023-24 season) 08/20/2022   Colon Cancer Screening  05/17/2023   Medicare Annual Wellness Visit  02/23/2024   DTaP/Tdap/Td vaccine (3 - Td or Tdap) 06/08/2025   Flu Shot  Completed   Hepatitis C Screening: USPSTF Recommendation to screen - Ages 18-79 yo.  Completed   Zoster (Shingles) Vaccine  Completed   HPV Vaccine  Aged Out    Advanced directives: Please bring a copy of your health care power of attorney and living will to the office at your convenience.  Conditions/risks identified: none at this time   Next appointment: Follow up in one year for your annual wellness visit.   Preventive Care 12 Years and Older, Male  Preventive care refers to lifestyle choices and visits with your health care provider that can promote health and wellness. What does preventive care include? A yearly physical exam. This is also called an annual well check. Dental exams once or twice a year. Routine eye exams. Ask your health care provider how often you should have your eyes checked. Personal lifestyle choices, including: Daily care of your teeth and gums. Regular physical activity. Eating a healthy diet. Avoiding tobacco and drug use. Limiting alcohol use. Practicing safe sex. Taking low doses of aspirin every day. Taking vitamin and mineral supplements as recommended by your health care provider. What happens during an annual well check? The services and screenings done by your health  care provider during your annual well check will depend on your age, overall health, lifestyle risk factors, and family history of disease. Counseling  Your health care provider may ask you questions about your: Alcohol use. Tobacco use. Drug use. Emotional well-being. Home and relationship well-being. Sexual activity. Eating habits. History of falls. Memory and ability to understand (cognition). Work and work Statistician. Screening  You may have the following tests or measurements: Height, weight, and BMI. Blood pressure. Lipid and cholesterol levels. These may be checked every 5 years, or more frequently if you are over 68 years old. Skin check. Lung cancer screening. You may have this screening every year starting at age 63 if you have a 30-pack-year history of smoking and currently smoke or have quit within the past 15 years. Fecal occult blood test (FOBT) of the stool. You may have this test every year starting at age 25. Flexible sigmoidoscopy or colonoscopy. You may have a sigmoidoscopy every 5 years or a colonoscopy every 10 years starting at age 5. Prostate cancer screening. Recommendations will vary depending on your family history and other risks. Hepatitis C blood test. Hepatitis B blood test. Sexually transmitted disease (STD) testing. Diabetes screening. This is done by checking your blood sugar (glucose) after you have not eaten for a while (fasting). You may have this done every 1-3 years. Abdominal aortic aneurysm (AAA) screening. You may need this if you are a current  or former smoker. Osteoporosis. You may be screened starting at age 41 if you are at high risk. Talk with your health care provider about your test results, treatment options, and if necessary, the need for more tests. Vaccines  Your health care provider may recommend certain vaccines, such as: Influenza vaccine. This is recommended every year. Tetanus, diphtheria, and acellular pertussis (Tdap, Td)  vaccine. You may need a Td booster every 10 years. Zoster vaccine. You may need this after age 63. Pneumococcal 13-valent conjugate (PCV13) vaccine. One dose is recommended after age 11. Pneumococcal polysaccharide (PPSV23) vaccine. One dose is recommended after age 20. Talk to your health care provider about which screenings and vaccines you need and how often you need them. This information is not intended to replace advice given to you by your health care provider. Make sure you discuss any questions you have with your health care provider. Document Released: 01/02/2016 Document Revised: 08/25/2016 Document Reviewed: 10/07/2015 Elsevier Interactive Patient Education  2017 [place] Prevention in the Home Falls can cause injuries. They can happen to people of all ages. There are many things you can do to make your home safe and to help prevent falls. What can I do on the outside of my home? Regularly fix the edges of walkways and driveways and fix any cracks. Remove anything that might make you trip as you walk through a door, such as a raised step or threshold. Trim any bushes or trees on the path to your home. Use bright outdoor lighting. Clear any walking paths of anything that might make someone trip, such as rocks or tools. Regularly check to see if handrails are loose or broken. Make sure that both sides of any steps have handrails. Any raised decks and porches should have guardrails on the edges. Have any leaves, snow, or ice cleared regularly. Use sand or salt on walking paths during winter. Clean up any spills in your garage right away. This includes oil or grease spills. What can I do in the bathroom? Use night lights. Install grab bars by the toilet and in the tub and shower. Do not use towel bars as grab bars. Use non-skid mats or decals in the tub or shower. If you need to sit down in the shower, use a plastic, non-slip stool. Keep the floor dry. Clean up any  water that spills on the floor as soon as it happens. Remove soap buildup in the tub or shower regularly. Attach bath mats securely with double-sided non-slip rug tape. Do not have throw rugs and other things on the floor that can make you trip. What can I do in the bedroom? Use night lights. Make sure that you have a light by your bed that is easy to reach. Do not use any sheets or blankets that are too big for your bed. They should not hang down onto the floor. Have a firm chair that has side arms. You can use this for support while you get dressed. Do not have throw rugs and other things on the floor that can make you trip. What can I do in the kitchen? Clean up any spills right away. Avoid walking on wet floors. Keep items that you use a lot in easy-to-reach places. If you need to reach something above you, use a strong step stool that has a grab bar. Keep electrical cords out of the way. Do not use floor polish or wax that makes floors slippery. If you must use wax,  use non-skid floor wax. Do not have throw rugs and other things on the floor that can make you trip. What can I do with my stairs? Do not leave any items on the stairs. Make sure that there are handrails on both sides of the stairs and use them. Fix handrails that are broken or loose. Make sure that handrails are as long as the stairways. Check any carpeting to make sure that it is firmly attached to the stairs. Fix any carpet that is loose or worn. Avoid having throw rugs at the top or bottom of the stairs. If you do have throw rugs, attach them to the floor with carpet tape. Make sure that you have a light switch at the top of the stairs and the bottom of the stairs. If you do not have them, ask someone to add them for you. What else can I do to help prevent falls? Wear shoes that: Do not have high heels. Have rubber bottoms. Are comfortable and fit you well. Are closed at the toe. Do not wear sandals. If you use a  stepladder: Make sure that it is fully opened. Do not climb a closed stepladder. Make sure that both sides of the stepladder are locked into place. Ask someone to hold it for you, if possible. Clearly mark and make sure that you can see: Any grab bars or handrails. First and last steps. Where the edge of each step is. Use tools that help you move around (mobility aids) if they are needed. These include: Canes. Walkers. Scooters. Crutches. Turn on the lights when you go into a dark area. Replace any light bulbs as soon as they burn out. Set up your furniture so you have a clear path. Avoid moving your furniture around. If any of your floors are uneven, fix them. If there are any pets around you, be aware of where they are. Review your medicines with your doctor. Some medicines can make you feel dizzy. This can increase your chance of falling. Ask your doctor what other things that you can do to help prevent falls. This information is not intended to replace advice given to you by your health care provider. Make sure you discuss any questions you have with your health care provider. Document Released: 10/02/2009 Document Revised: 05/13/2016 Document Reviewed: 01/10/2015 Elsevier Interactive Patient Education  2017 [employer].

## 2023-02-23 NOTE — Progress Notes (Signed)
I connected with  Patrick [name] on 02/23/23 by a audio enabled telemedicine application and verified that I am speaking with the correct person using two identifiers.  Patient Location: Home  Provider Location: Home Office  I discussed the limitations of evaluation and management by telemedicine. The patient expressed understanding and agreed to proceed.    Patient Medicare AWV questionnaire was completed by the patient on 02/23/23; I have confirmed that all information answered by patient is correct and no changes since this date.          Subjective:   [name] is a 67 y.o. male who presents for an Initial Medicare Annual Wellness Visit.  Review of Systems     Cardiac Risk Factors include: advanced age (>40mn, >>70women);male gender;dyslipidemia;hypertension     Objective:    Today's Vitals   02/23/23 0805  Weight: 172 lb (78 kg)   Body mass index is 26.15 kg/m.     02/23/2023    8:11 AM 07/28/2020    3:00 PM 07/16/2020   10:04 AM 04/25/2020   12:05 PM 05/16/2018    8:04 AM 11/10/2012    1:31 PM  Advanced Directives  Does Patient Have a Medical Advance Directive? Yes Yes Yes Yes Yes Patient has advance directive, copy not in chart  Type of Advance Directive [place] will [place] will [place] will [place] will [place] will  Does patient want to make changes to medical advance directive?  No - Guardian declined  No - Patient declined    Copy of [place] Chart? No - copy requested   No - copy requested No - copy requested   Pre-existing out of facility DNR order (yellow form or pink MOST form)      No    Current Medications (verified) Outpatient Encounter Medications as of 02/23/2023  Medication Sig   aspirin EC 81 MG tablet Take 81 mg by mouth daily. Swallow  whole.   losartan (COZAAR) 25 MG tablet Take 1 tablet (25 mg total) by mouth daily.   Multiple Vitamin (MULTIVITAMIN) tablet Take 1 tablet by mouth daily.   Omega-3 Fatty Acids (FISH OIL PO) Take 600 mg by mouth.   rosuvastatin (CRESTOR) 20 MG tablet Take 1 tablet (20 mg total) by mouth daily.   tadalafil (CIALIS) 5 MG tablet 1 times per day   traZODone (DESYREL) 50 MG tablet Take 0.5-1 tablets (25-50 mg total) by mouth at bedtime as needed for sleep.   [DISCONTINUED] metoprolol tartrate (LOPRESSOR) 50 MG tablet Take 1 tablet (50 mg total) by mouth once for 1 dose. TAKE TWO HOURS PRIOR TO  SCHEDULE CARDIAC TEST   No facility-administered encounter medications on file as of 02/23/2023.    Allergies (verified) Patient has no known allergies.   History: Past Medical History:  Diagnosis Date   [place]    prostate CA- bx done 11-18, hasn't started treatment   Coronary artery calcification seen on CAT scan 05/24/2022   Coronary Calcium Score (10/2018)- 158.  Mild Calcium in all 3 Epicardial Coronaries.  Slight TA dilation ~4 cm.; 05/2022: Coronary Calcium Score: 440. Left main: , LAD 261, LCx 67, RCA 109.  81st percentile.  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   Hyperlipidemia    Rosuvastatin just ordered, but not yet started.   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 [name], right   LYMPHADENECTOMY Bilateral 07/28/2020   Procedure: LYMPHADENECTOMY, PELVIC;  Surgeon: [name], MD;  Location: [facility];  Service: Urology;  Laterality: Bilateral;   PROSTATE BIOPSY     November 2018   ROBOT ASSISTED LAPAROSCOPIC RADICAL PROSTATECTOMY N/A 07/28/2020   Procedure: XI ROBOTIC ASSISTED LAPAROSCOPIC RADICAL  PROSTATECTOMY LEVEL 2;  Surgeon: [name], MD;  Location: [facility];  Service: Urology;  Laterality: N/A;   Family History  Problem Relation Age of Onset   Heart disease Father        66s heavy smoker   Prostate cancer Father        36s or 3s   Hyperlipidemia Father    Diabetes Mother    Cancer Mother 61       lung cancer-nonsmoker but 2nd hand smoke   Lung cancer Mother        died at 24   Heart disease Brother    Colon cancer Neg Hx    Esophageal cancer Neg Hx    Rectal cancer Neg Hx    Stomach cancer Neg Hx    Breast cancer Neg Hx    Social History   Socioeconomic History   Marital status: Married    Spouse name: Not on file   Number of children: Not on file   Years of education: Not on file   Highest education level: Not on file  Occupational History   Occupation: [phone]    Employer: [employer]   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 2 kids- son in [place] and daughter in [place] with 2 grandkids age 32 and 71 in 2022.       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   Social Determinants of Health   Financial Resource Strain: Low Risk  (02/23/2023)   Overall Financial Resource Strain (CARDIA)    Difficulty of Paying Living Expenses: Not hard at all  Food Insecurity: No Food Insecurity (02/23/2023)   Hunger Vital Sign    Worried About Running Out of Food in the Last Year: Never true    Ran Out of Food in the Last Year: Never true  Transportation  Needs: No Transportation Needs (02/23/2023)   PRAPARE - Hydrologist (Medical): No    Lack of Transportation (Non-Medical): No  Physical Activity: Insufficiently Active (02/23/2023)   Exercise Vital Sign    Days of Exercise per Week: 2 days    Minutes of Exercise per Session: 40 min  Stress: Stress Concern Present (02/23/2023)   [place]  Feeling of Stress : To some extent  Social Connections: Moderately Integrated (02/23/2023)   Social Connection and Isolation Panel [NHANES]    Frequency of Communication with Friends and Family: More than three times a week    Frequency of Social Gatherings with Friends and Family: Three times a week    Attends Religious Services: Never    Active Member of Clubs or Organizations: Yes    Attends Music therapist: More than 4 times per year    Marital Status: Married    Tobacco Counseling Counseling given: Not Answered   Clinical Intake:  Pre-visit preparation completed: Yes  Pain : No/denies pain     BMI - recorded: 26.15 Nutritional Status: BMI 25 -29 Overweight Nutritional Risks: None Diabetes: No  How often do you need to have someone help you when you read instructions, pamphlets, or other written materials from your doctor or pharmacy?: 1 - Never  Diabetic?no  Interpreter Needed?: No  Information entered by :: Charlott Rakes, LPN   Activities of Daily Living    02/23/2023    6:42 AM  In your present state of health, do you have any difficulty performing the following activities:  Hearing? 0  Vision? 0  Difficulty concentrating or making decisions? 0  Walking or climbing stairs? 0  Dressing or bathing? 0  Doing errands, shopping? 0  Preparing Food and eating ? N  Using the Toilet? N  In the past six months, have you accidently leaked urine? N  Do you have problems with loss of bowel control? N  Managing your  Medications? N  Managing your Finances? N  Housekeeping or managing your Housekeeping? N    Patient Care Team: Marin Olp, MD as PCP - General (Family Medicine) Leonie Man, MD as PCP - Cardiology (Cardiology) [facility] as Consulting Physician (Dermatology) Iona Beard, [place] as Referring Physician (Optometry) Szott, Prescott Gum, DDS as Referring Physician (Dentistry) [facility], [facility] as Consulting Physician (Urology) Tyler Pita, MD as Consulting Physician (Radiation Oncology) Keane Scrape  Indicate any recent Medical Services you may have received from other than [facility] providers in the past year (date may be approximate).     Assessment:   This is a routine wellness examination for Patrick [name].  Hearing/Vision screen Hearing Screening - Comments:: Pt denies any hearing issues  Vision Screening - Comments:: Pt follows up with [facility] For annual eye exams   Dietary issues and exercise activities discussed: Current Exercise Habits: Home exercise routine, Type of exercise: Other - see comments, Time (Minutes): 40, Frequency (Times/Week): 2, Weekly Exercise (Minutes/Week): 80   Goals Addressed             This Visit's Progress    Patient Stated       None at this time        Depression Screen    02/23/2023    8:09 AM 11/24/2021    8:04 AM 11/21/2020   11:06 AM 11/03/2018    2:51 PM  PHQ 2/9 Scores  PHQ - 2 Score 0 0 0 0    Fall Risk    02/23/2023    6:42 AM 11/01/2019    9:08 AM  Landa in the past year? 0 0  Number falls in past yr: 0 0  Injury with Fall? 0 0  Risk for fall due to : Impaired vision   Follow up Falls prevention discussed     FALL RISK PREVENTION PERTAINING  TO THE HOME:  Any stairs in or around the home? Yes  If so, are there any without handrails? No  Home free of loose throw rugs in walkways, pet beds, electrical cords, etc? Yes  Adequate lighting in your home  to reduce risk of falls? Yes   ASSISTIVE DEVICES UTILIZED TO PREVENT FALLS:  Life alert? No  Use of a cane, walker or w/c? No  Grab bars in the bathroom? Yes  Shower chair or bench in shower? Yes  Elevated toilet seat or a handicapped toilet? No   TIMED UP AND GO:  Was the test performed? No .   Cognitive Function:        02/23/2023    8:14 AM  6CIT Screen  What Year? 0 points  What month? 0 points  What time? 0 points  Count back from 20 0 points  Months in reverse 0 points  Repeat phrase 0 points  Total Score 0 points    Immunizations Immunization History  Administered Date(s) Administered   Fluad Quad(high Dose 65+) 01/14/2023   Influenza Split 10/25/2011, 11/03/2012   Influenza Whole 10/08/2008, 09/23/2009, 09/08/2010   Influenza,inj,Quad PF,6+ Mos 11/09/2013, 10/14/2014, 08/22/2015, 09/16/2017, 10/27/2018, 10/08/2019, 11/21/2020, 10/28/2021   Influenza-Unspecified 09/14/2016   PFIZER(Purple Top)SARS-COV-2 Vaccination 03/06/2020, 03/27/2020, 10/07/2020   Td 02/17/2005   Tdap 06/09/2015   Zoster Recombinat (Shingrix) 07/13/2017, 09/16/2017    TDAP status: Up to date  Flu Vaccine status: Up to date  Pneumococcal vaccine status: Due, Education has been provided regarding the importance of this vaccine. Advised may receive this vaccine at local pharmacy or Health Dept. Aware to provide a copy of the vaccination record if obtained from local pharmacy or Health Dept. Verbalized acceptance and understanding.  Covid-19 vaccine status: Completed vaccines  Qualifies for Shingles Vaccine? Yes   Zostavax completed Yes   Shingrix Completed?: Yes  Screening Tests Health Maintenance  Topic Date Due   Pneumonia Vaccine 64+ Years old (1 of 1 - PCV) Never done   COVID-19 Vaccine (4 - 2023-24 season) 08/20/2022   COLONOSCOPY (Pts 45-31yr Insurance coverage will need to be confirmed)  05/17/2023   Medicare Annual Wellness (AWV)  02/23/2024   DTaP/Tdap/Td (3 - Td or Tdap)  06/08/2025   INFLUENZA VACCINE  Completed   Hepatitis C Screening  Completed   Zoster Vaccines- Shingrix  Completed   HPV VACCINES  Aged Out    Health Maintenance  Health Maintenance Due  Topic Date Due   Pneumonia Vaccine 66 Years old (1 of 1 - PCV) Never done   COVID-19 Vaccine (4 - 2023-24 season) 08/20/2022    Colorectal cancer screening: Type of screening: Colonoscopy. Completed 05/16/18. Repeat every 5 years   Additional Screening:  Hepatitis C Screening: Completed 06/08/16  Vision Screening: Recommended annual ophthalmology exams for early detection of glaucoma and other disorders of the eye. Is the patient up to date with their annual eye exam?  Yes  Who is the provider or what is the name of the office in which the patient attends annual eye exams[facility]  If pt is not established with a provider, would they like to be referred to a provider to establish care? No .   Dental Screening: Recommended annual dental exams for proper oral hygiene  Community Resource Referral / Chronic Care Management: CRR required this visit?  No   CCM required this visit?  No      Plan:     I have personally reviewed and  noted the following in the patient's chart:   Medical and social history Use of alcohol, tobacco or illicit drugs  Current medications and supplements including opioid prescriptions. Patient is not currently taking opioid prescriptions. Functional ability and status Nutritional status Physical activity Advanced directives List of other physicians Hospitalizations, surgeries, and ER visits in previous 12 months Vitals Screenings to include cognitive, depression, and falls Referrals and appointments  In addition, I have reviewed and discussed with patient certain preventive protocols, quality metrics, and best practice recommendations. A written personalized care plan for preventive services as well as general preventive health recommendations  were provided to patient.     Willette Brace, LPN   [phone]   Nurse Notes: none

## 2023-02-26 ENCOUNTER — Encounter: Payer: Self-pay | Admitting: Family Medicine

## 2023-03-01 NOTE — Telephone Encounter (Signed)
See below and scan please.

## 2023-03-15 ENCOUNTER — Other Ambulatory Visit: Payer: Self-pay

## 2023-03-15 ENCOUNTER — Encounter: Payer: Self-pay | Admitting: Family Medicine

## 2023-03-15 MED ORDER — TRAZODONE HCL 50 MG PO TABS: 25.0000 mg | ORAL_TABLET | Freq: Every evening | ORAL | 3 refills | Status: DC | PRN

## 2023-05-29 ENCOUNTER — Encounter: Payer: Self-pay | Admitting: Family Medicine

## 2023-06-01 ENCOUNTER — Encounter: Payer: Self-pay | Admitting: Family Medicine

## 2023-06-01 ENCOUNTER — Ambulatory Visit (INDEPENDENT_AMBULATORY_CARE_PROVIDER_SITE_OTHER): Payer: Medicare Other | Admitting: Family Medicine

## 2023-06-01 VITALS — BP 128/78 | HR 65 | Temp 97.3°F | Ht 68.0 in | Wt 179.2 lb

## 2023-06-01 DIAGNOSIS — I10 Essential (primary) hypertension: Secondary | ICD-10-CM

## 2023-06-01 DIAGNOSIS — Z23 Encounter for immunization: Secondary | ICD-10-CM | POA: Diagnosis not present

## 2023-06-01 DIAGNOSIS — E785 Hyperlipidemia, unspecified: Secondary | ICD-10-CM

## 2023-06-01 DIAGNOSIS — Z131 Encounter for screening for diabetes mellitus: Secondary | ICD-10-CM

## 2023-06-01 DIAGNOSIS — R739 Hyperglycemia, unspecified: Secondary | ICD-10-CM

## 2023-06-01 LAB — CBC WITH DIFFERENTIAL/PLATELET
Basophils Absolute: 0 10*3/uL (ref 0.0–0.1)
Basophils Relative: 0.8 % (ref 0.0–3.0)
Eosinophils Absolute: 0.1 10*3/uL (ref 0.0–0.7)
Eosinophils Relative: 2.5 % (ref 0.0–5.0)
HCT: 45.1 % (ref 39.0–52.0)
Hemoglobin: 15.2 g/dL (ref 13.0–17.0)
Lymphocytes Relative: 18.3 % (ref 12.0–46.0)
Lymphs Abs: 0.7 10*3/uL (ref 0.7–4.0)
MCHC: 33.6 g/dL (ref 30.0–36.0)
MCV: 92.2 fl (ref 78.0–100.0)
Monocytes Absolute: 0.4 10*3/uL (ref 0.1–1.0)
Monocytes Relative: 10.9 % (ref 3.0–12.0)
Neutro Abs: 2.6 10*3/uL (ref 1.4–7.7)
Neutrophils Relative %: 67.5 % (ref 43.0–77.0)
Platelets: 188 10*3/uL (ref 150.0–400.0)
RBC: 4.89 Mil/uL (ref 4.22–5.81)
RDW: 13.4 % (ref 11.5–15.5)
WBC: 3.8 10*3/uL — ABNORMAL LOW (ref 4.0–10.5)

## 2023-06-01 LAB — COMPREHENSIVE METABOLIC PANEL
ALT: 36 U/L (ref 0–53)
AST: 28 U/L (ref 0–37)
Albumin: 4.4 g/dL (ref 3.5–5.2)
Alkaline Phosphatase: 53 U/L (ref 39–117)
BUN: 14 mg/dL (ref 6–23)
CO2: 25 mEq/L (ref 19–32)
Calcium: 9.1 mg/dL (ref 8.4–10.5)
Chloride: 104 mEq/L (ref 96–112)
Creatinine, Ser: 0.8 mg/dL (ref 0.40–1.50)
GFR: 92.33 mL/min (ref >60.00)
Glucose, Bld: 94 mg/dL (ref 70–99)
Potassium: 4.3 mEq/L (ref 3.5–5.1)
Sodium: 138 mEq/L (ref 135–145)
Total Bilirubin: 0.8 mg/dL (ref 0.2–1.2)
Total Protein: 7 g/dL (ref 6.0–8.3)

## 2023-06-01 LAB — LIPID PANEL
Cholesterol: 148 mg/dL (ref 0–200)
HDL: 56.1 mg/dL (ref >39.00)
LDL Cholesterol: 76 mg/dL (ref 0–99)
NonHDL: 92.09
Total CHOL/HDL Ratio: 3
Triglycerides: 79 mg/dL (ref 0.0–149.0)
VLDL: 15.8 mg/dL (ref 0.0–40.0)

## 2023-06-01 LAB — HEMOGLOBIN A1C: Hgb A1c MFr Bld: 5.5 % (ref 4.6–6.5)

## 2023-06-01 NOTE — Addendum Note (Signed)
Addended by: Gwenette Greet on: 06/01/2023 08:57 AM   Modules accepted: Orders

## 2023-06-01 NOTE — Patient Instructions (Addendum)
Prevnar 20 today  Please stop by lab before you go If you have mychart- we will send your results within 3 business days of [place] receiving them.  If you do not have mychart- we will call you about results within 5 business days of [place] receiving them.  *please also note that you will see labs on mychart as soon as they post. I will later go in and write notes on them- will say "notes from Dr. Durene Cal"   Recommended follow up: Return in about 1 year (around 05/31/2024) for physical or sooner if needed.Schedule b4 you leave.

## 2023-06-01 NOTE — Progress Notes (Signed)
Phone: [phone]   Subjective:  Patient presents today for their annual follow up  Chief complaint-noted.   See problem oriented charting- ROS- full  review of systems was completed and negative  Per full ROS sheet completed by patient  The following were reviewed and entered/updated in epic: Past Medical History:  Diagnosis Date   Cancer (HCC)    prostate CA- bx done 11-18, hasn't started treatment   Coronary artery calcification seen on CAT scan 05/24/2022   Coronary Calcium Score (10/2018)- 158.  Mild Calcium in all 3 Epicardial Coronaries.  Slight TA dilation ~4 cm.; 05/2022: Coronary Calcium Score: 440. Left main: , LAD 261, LCx 67, RCA 109.  81st percentile.  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   Hyperlipidemia    Rosuvastatin just ordered, but not yet started.   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   Qualifier: Diagnosis of  By: Lovell Sheehan MD, [name]    Prostate cancer [facility])    Patient Active Problem List   Diagnosis Date Noted   History of prostate cancer 05/25/2018    Priority: High   Coronary artery calcification seen on CAT scan     Priority: Medium    Hyperglycemia 05/17/2017    Priority: Medium    History of skin cancer 06/15/2016    Priority: Medium    Essential hypertension 06/09/2015    Priority: Medium    Hyperlipidemia LDL goal <70 02/28/2008    Priority: Medium    History of adenomatous polyp of colon 10/27/2018    Priority: Low   Pleural effusion, bilateral 11/19/2022   Right knee pain 06/02/2022   Rupture of UCL of right thumb 02/25/2020   Anxiety 10/27/2018   Rotator cuff syndrome of left shoulder 03/16/2016   Acromioclavicular joint arthritis 03/16/2016   Anterior tibialis tendinitis of left leg 01/02/2015   Past Surgical History:  Procedure Laterality Date   A/C repair right  years ago   HS football injury   COLONOSCOPY     KNEE ARTHROSCOPY  11/15/2012   meniscus repair- Dr. Despina Hick, right    LYMPHADENECTOMY Bilateral 07/28/2020   Procedure: LYMPHADENECTOMY, PELVIC;  Surgeon: Heloise Purpura, MD;  Location: [facility];  Service: Urology;  Laterality: Bilateral;   PROSTATE BIOPSY     November 2018   ROBOT ASSISTED LAPAROSCOPIC RADICAL PROSTATECTOMY N/A 07/28/2020   Procedure: XI ROBOTIC ASSISTED LAPAROSCOPIC RADICAL PROSTATECTOMY LEVEL 2;  Surgeon: Heloise Purpura, MD;  Location: [facility];  Service: Urology;  Laterality: N/A;    Family History  Problem Relation Age of Onset   Diabetes Mother    Cancer Mother 30       lung cancer-nonsmoker but 2nd hand smoke   Lung cancer Mother        died at 70   Heart disease Father        63s heavy smoker   Prostate cancer Father        17s or 87s   Hyperlipidemia Father    Healthy Sister    Heart disease Brother        very different lifestyle   Heart disease Paternal Grandmother        and several uncles   Colon cancer Neg Hx    Esophageal cancer Neg Hx    Rectal cancer Neg Hx    Stomach cancer Neg Hx    Breast cancer Neg Hx     Medications- reviewed and updated Current Outpatient Medications  Medication Sig Dispense Refill  aspirin EC 81 MG tablet Take 81 mg by mouth daily. Swallow whole.     losartan (COZAAR) 25 MG tablet Take 1 tablet (25 mg total) by mouth daily. 90 tablet 3   Multiple Vitamin (MULTIVITAMIN) tablet Take 1 tablet by mouth daily.     Omega-3 Fatty Acids (FISH OIL PO) Take 600 mg by mouth.     rosuvastatin (CRESTOR) 20 MG tablet Take 1 tablet (20 mg total) by mouth daily. 90 tablet 3   tadalafil (CIALIS) 5 MG tablet 1 times per day     traZODone (DESYREL) 50 MG tablet Take 0.5-1 tablets (25-50 mg total) by mouth at bedtime as needed for sleep. 90 tablet 3   No current facility-administered medications for this visit.   Allergies-reviewed and updated No Known Allergies  Social History   Social History Narrative   Married 1979. 2 kids- son in [place] and daughter in [place] with 2 grandkids age 68 and 7 in 2022.        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   Objective  Objective:  BP 128/78   Pulse 65   Temp (!) 97.3 F (36.3 C)   Ht 5\' 8"  (1.727 m)   Wt 179 lb 3.2 oz (81.3 kg)   SpO2 97%   BMI 27.25 kg/m  Gen: NAD, resting comfortably HEENT: Mucous membranes are moist. Oropharynx normal Neck: no thyromegaly CV: RRR no murmurs rubs or gallops Lungs: CTAB no crackles, wheeze, rhonchi Abdomen: soft/nontender/nondistended/normal bowel sounds. No rebound or guarding.  Ext: no edema Skin: warm, dry Neuro: grossly normal, moves all extremities, PERRLA    Assessment and Plan  67 y.o. male presenting for annual follow up  Health Maintenance counseling: 1. Anticipatory guidance: Patient counseled regarding regular dental exams -q6 months, eye exams -yearly,  avoiding smoking and second hand smoke , limiting alcohol to 2 beverages per day - occasionally just above 2 - encouraged 2 or less per day, no illicit drugs .   2. Risk factor reduction:  Advised patient of need for regular exercise and diet rich and fruits and vegetables to reduce risk of heart attack and stroke.  Exercise- exercising regularly- getting some good hard hikes in , yoga also very helpful Diet/weight management-down 5 lbs from last physical- continues to try to eat reasonably healthy diet- mild gradual weight loss encouraged.  Wt Readings from Last 3 Encounters:  06/01/23 179 lb 3.2 oz (81.3 kg)  02/23/23 172 lb (78 kg)  01/14/23 172 lb 12.8 oz (78.4 kg)  3. Immunizations/screenings/ancillary studies- prevnar 20  today, COVID vaccine - holding off for now Immunization History  Administered Date(s) Administered   Fluad Quad(high Dose 65+) 01/14/2023   Influenza Split 10/25/2011, 11/03/2012   Influenza Whole  10/08/2008, 09/23/2009, 09/08/2010   Influenza,inj,Quad PF,6+ Mos 11/09/2013, 10/14/2014, 08/22/2015, 09/16/2017, 10/27/2018, 10/08/2019, 11/21/2020, 10/28/2021   Influenza-Unspecified 09/14/2016   PFIZER(Purple Top)SARS-COV-2 Vaccination 03/06/2020, 03/27/2020, 10/07/2020   Td 02/17/2005   Tdap 06/09/2015   Zoster Recombinat (Shingrix) 07/13/2017, 09/16/2017   4. Prostate cancer screening-  prior prostate cancer surgery in past August 2021- sees Dr. Laverle Patter on regular basis- just had done and at 0 on PSA  Lab Results  Component Value Date   PSA 0.00 Repeated and verified X2. (L) 11/23/2021   PSA <  0.04 11/12/2020   PSA 6.53 (H) 10/29/2019   5. Colon cancer screening - just had update in January- team is requesting this. Last on file with [place] 05/09/2018 and this was in [place], [place] 6. Skin cancer screening- seeing dermatologist yearly. advised regular sunscreen use. Denies worrisome, changing, or new skin lesions.  7. Smoking associated screening (lung cancer screening, AAA screen 65-75, UA)- never smoker 8. 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 in decm 2023- was managed by pulmonary and released 01/14/23- has recovered well  #hypertension S: medication: losartan 25  mg Home readings #s: not checking BP Readings from Last 3 Encounters:  06/01/23 128/78  01/14/23 128/72  12/08/22 (!) 152/88  A/P: stable- continue current medicines   #hyperlipidemia # ct cardiac june 2023 up to 440- worsened to >80% with aortic dilation - repeat 1 year cardiology- was 41 mm on echocardiogram 11/19/22  S: Medication: rosuvastatin 20 mg daily MWF, half tablet all other days , aspirin 81mg  MWF Lab Results  Component Value Date   CHOL 148 11/23/2021   HDL 53.00 11/23/2021   LDLCALC 78 11/23/2021   LDLDIRECT 74.0 02/10/2021   TRIG 89.0 11/23/2021    CHOLHDL 3 11/23/2021   A/P: known cardiac calcium- aggressive risk factor modification in place- hoping for LDL at least under 70 today with current regimen. Continue aspirin and he plans to schedule Follow up with Dr. Herbie Baltimore team- cannot get into Dr. Herbie Baltimore within 6 months and just over a year - with aortic dilation would avoid quinolones  # Hyperglycemia/insulin resistance/prediabetes S:  Medication: none. Did discuss rosuvastatin can increase diabetes risk some but we need the statin for prevention Lab Results  Component Value Date   HGBA1C 5.7 11/23/2021   HGBA1C 5.5 11/12/2020   HGBA1C 5.6 10/29/2019   A/P: hopefully stable- update a1ctoday. Continue without meds for now   # Insomnia S:trazodone for sleep just half tablet A/P: doing well but he may trial off and see if able to rest   #erectile dysfunction (ED) - 5 mg twice weekly - had been on daily and able to wean down  Recommended follow up: Return in about 1 year (around 05/31/2024) for physical or sooner if needed.Schedule b4 you leave. Future Appointments  Date Time Provider Department Center  02/28/2024  8:15 AM [facility] ANNUAL WELLNESS VISIT 1 [facility]   Lab/Order associations: fasting   ICD-10-CM   1. Essential hypertension  I10     2. Hyperlipidemia LDL goal <70  E78.5 Comprehensive metabolic panel    CBC with Differential/Platelet    Lipid panel    3. Hyperglycemia  R73.9 Hemoglobin A1c    4. Screening for diabetes mellitus  Z13.1 Hemoglobin A1c     No orders of the defined types were placed in this encounter.  Return precautions advised.  Tana Conch, MD

## 2023-06-02 ENCOUNTER — Encounter: Payer: Self-pay | Admitting: Gastroenterology

## 2023-06-07 ENCOUNTER — Other Ambulatory Visit: Payer: Self-pay

## 2023-06-07 DIAGNOSIS — E785 Hyperlipidemia, unspecified: Secondary | ICD-10-CM

## 2023-08-23 ENCOUNTER — Other Ambulatory Visit: Payer: Medicare Other

## 2023-08-31 ENCOUNTER — Ambulatory Visit (INDEPENDENT_AMBULATORY_CARE_PROVIDER_SITE_OTHER): Payer: Medicare Other

## 2023-08-31 ENCOUNTER — Other Ambulatory Visit: Payer: Medicare Other

## 2023-08-31 DIAGNOSIS — Z23 Encounter for immunization: Secondary | ICD-10-CM | POA: Diagnosis not present

## 2023-08-31 DIAGNOSIS — E785 Hyperlipidemia, unspecified: Secondary | ICD-10-CM

## 2023-09-01 LAB — LIPID PANEL
Chol/HDL Ratio: 2.9 ratio (ref 0.0–5.0)
Cholesterol, Total: 161 mg/dL (ref 100–199)
HDL: 56 mg/dL (ref >39)
LDL Chol Calc (NIH): 85 mg/dL (ref 0–99)
Triglycerides: 111 mg/dL (ref 0–149)
VLDL Cholesterol Cal: 20 mg/dL (ref 5–40)

## 2023-09-01 LAB — COMPREHENSIVE METABOLIC PANEL
ALT: 37 IU/L (ref 0–44)
AST: 25 IU/L (ref 0–40)
Albumin: 4.4 g/dL (ref 3.9–4.9)
Alkaline Phosphatase: 67 IU/L (ref 44–121)
BUN/Creatinine Ratio: 19 (ref 10–24)
BUN: 16 mg/dL (ref 8–27)
Bilirubin Total: 0.7 mg/dL (ref 0.0–1.2)
CO2: 24 mmol/L (ref 20–29)
Calcium: 8.9 mg/dL (ref 8.6–10.2)
Chloride: 103 mmol/L (ref 96–106)
Creatinine, Ser: 0.86 mg/dL (ref 0.76–1.27)
Globulin, Total: 2.4 g/dL (ref 1.5–4.5)
Glucose: 106 mg/dL — ABNORMAL HIGH (ref 70–99)
Potassium: 4.3 mmol/L (ref 3.5–5.2)
Sodium: 139 mmol/L (ref 134–144)
Total Protein: 6.8 g/dL (ref 6.0–8.5)
eGFR: 95 mL/min/{1.73_m2} (ref >59)

## 2023-09-02 ENCOUNTER — Encounter: Payer: Self-pay | Admitting: Family Medicine

## 2023-09-28 ENCOUNTER — Encounter: Payer: Self-pay | Admitting: Physician Assistant

## 2023-09-28 ENCOUNTER — Ambulatory Visit: Payer: Medicare Other | Attending: Physician Assistant | Admitting: Physician Assistant

## 2023-09-28 VITALS — BP 142/76 | HR 74 | Ht 68.0 in | Wt 180.8 lb

## 2023-09-28 DIAGNOSIS — E785 Hyperlipidemia, unspecified: Secondary | ICD-10-CM | POA: Diagnosis present

## 2023-09-28 DIAGNOSIS — Z79899 Other long term (current) drug therapy: Secondary | ICD-10-CM | POA: Diagnosis present

## 2023-09-28 DIAGNOSIS — I3131 Malignant pericardial effusion in diseases classified elsewhere: Secondary | ICD-10-CM | POA: Insufficient documentation

## 2023-09-28 DIAGNOSIS — I7121 Aneurysm of the ascending aorta, without rupture: Secondary | ICD-10-CM | POA: Diagnosis present

## 2023-09-28 DIAGNOSIS — I2510 Atherosclerotic heart disease of native coronary artery without angina pectoris: Secondary | ICD-10-CM | POA: Diagnosis not present

## 2023-09-28 DIAGNOSIS — I10 Essential (primary) hypertension: Secondary | ICD-10-CM | POA: Diagnosis present

## 2023-09-28 NOTE — Progress Notes (Signed)
Cardiology Office Note:  .   Date:  09/28/2023  ID:  Patrick [name], DOB [birth year], MRN [id number] PCP: Shelva Majestic, MD  [facility] Providers Cardiologist:  Bryan Lemma, MD     History of Present Illness: .   Patrick [name] is a 67 y.o. male with PMH of CAD, TAA, hypertension, hyperlipidemia and history of prostate cancer.  Coronary calcium score obtained in June 2023 demonstrated 5 mm right lower lobe lung nodule, coronary calcium score 440 which placed the patient on 81st percentile for age and sex matched control, mildly dilated ascending aorta measuring 40 mm.  Echocardiogram obtained on 11/19/2022 showed EF 55 to 60%, grade 1 DD, small pericardial effusion, large left pleural effusion, dilated ascending aorta measuring 41 mm.  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  Coronary calcium score of 484 which placed the patient in the 82nd percentile for age and sex matched control.  FFR analysis showed nonobstructive CAD with 0.91 across the proximal LAD and 0.81 across the mid LAD.  He was referred to pulmonology service to follow-up on pleural effusion related to coxsackievirus infection, chest x-ray obtained in January 2024 showed no pleural effusion.  Patient presents today for follow-up.  He denies any chest pain or shortness of breath.  He says it took him 6 months to get over the coxsackievirus infection and pleural effusion.  He is feeling better and has been able to do everyday activity without any exertional chest pain or shortness of breath.  We reviewed his previous coronary CT from last year and also echocardiogram.  He has dilated thoracic aorta, we will repeat echocardiogram.  His EKG today shows possible Q waves in the inferior lead however patient  adamantly denies any major chest pain in the past year.  Will follow-up on echocardiogram regarding wall motion.  If echocardiogram is normal, he can follow-up in 1 year.  Recent blood work does show that his LDL remains elevated at 85.  He says he was not taking the Crestor every day instead he was taking it every other day.  However since the recent blood work, he has decided to start taking the Crestor on a daily basis.  I recommended repeat fasting lipid panel and LFT in 6 months.  His blood pressure is elevated, even on repeat blood pressure check, it was 142/76.  He says his blood pressure is typically running in the 120s at home.  I asked him to monitor his blood pressure if systolic blood pressure persistently over 135 mmHg after 2 weeks, he has been instructed to increase his losartan to 50 mg daily and to give [place] a call so we can send in a new prescription.  Otherwise, he can follow-up with Dr. Herbie Baltimore in 1 year.  ROS:   He denies chest pain, palpitations, dyspnea, pnd, orthopnea, n, v, dizziness, syncope, edema, weight gain, or early satiety. All other systems reviewed and are otherwise negative except as noted above.    Studies Reviewed: Marland Kitchen   EKG Interpretation Date/Time:  Wednesday September 28 2023 10:41:57 EDT Ventricular Rate:  74 PR Interval:  174 QRS Duration:  100 QT Interval:  394 QTC Calculation: 437 R Axis:   120  Text Interpretation: Normal sinus rhythm  Q wave in the  inferior leads Confirmed by Azalee Course [phone]) on 09/28/2023 1:05:17 PM     Risk Assessment/Calculations:     HYPERTENSION CONTROL Vitals:   09/28/23 1037 09/28/23 1307  BP: (!) 142/82 (!) 142/76    The patient's blood pressure is elevated above target today.  In order to address the patient's elevated BP: Blood pressure will be monitored at home to determine if medication changes need to be made.          Physical Exam:   VS:  BP (!) 142/76   Pulse 74   Ht 5\' 8"  (1.727 m)   Wt 180 lb 12.8 oz (82  kg)   SpO2 98%   BMI 27.49 kg/m    Wt Readings from Last 3 Encounters:  09/28/23 180 lb 12.8 oz (82 kg)  06/01/23 179 lb 3.2 oz (81.3 kg)  02/23/23 172 lb (78 kg)    GEN: Well nourished, well developed in no acute distress NECK: No JVD; No carotid bruits CARDIAC: RRR, no murmurs, rubs, gallops RESPIRATORY:  Clear to auscultation without rales, wheezing or rhonchi  ABDOMEN: Soft, non-tender, non-distended EXTREMITIES:  No edema; No deformity   ASSESSMENT AND PLAN: .    Coronary Artery Disease 60% blockage in the left anterior descending artery, not hemodynamically significant as of December last year. Asymptomatic currently. -Continue Crestor 20mg  daily. -Repeat lipid panel and liver function test in 6 months.  Dilated Thoracic Aorta Requires yearly follow-up. -Order repeat echocardiogram to monitor progression.  Hypertension Slightly elevated blood pressure at current visit. -Continue Losartan 25mg  daily. -Monitor blood pressure at home. If systolic blood pressure persistently over after 2 weeks, double Losartan dose to 50mg  daily and call office for new prescription.  Hyperlipidemia: -Repeat fasting lipid panel and LFT in 6 months, LDL goal less than 70  Pericardial Effusion and Pleural Effusion Resolved as of January this year. -Related to coxsackievirus infection, resolved  General Health Maintenance / Followup Plans -Return for follow-up in 12 months if echocardiogram is normal. If abnormal, office will call to arrange earlier follow-up.       Dispo: Follow-up in 1 year unless echocardiogram is abnormal  Signed, Azalee Course, [place]

## 2023-09-28 NOTE — Patient Instructions (Addendum)
Medication Instructions:  NO CHANGES *If you need a refill on your cardiac medications before your next appointment, please call your pharmacy*   Lab Work: FASTING LIPIDS AND LIVER FUNCTION IN 6 MONTHS. If you have labs (blood work) drawn today and your tests are completely normal, you will receive your results only by: MyChart Message (if you have MyChart) OR A paper copy in the mail If you have any lab test that is abnormal or we need to change your treatment, we will call you to review the results.   Testing/Procedures:[address] Your physician has requested that you have an echocardiogram. Echocardiography is a painless test that uses sound waves to create images of your heart. It provides your doctor with information about the size and shape of your heart and how well your heart's chambers and valves are working. This procedure takes approximately one hour. There are no restrictions for this procedure. Please do NOT wear cologne, perfume, aftershave, or lotions (deodorant is allowed). Please arrive 15 minutes prior to your appointment time.    Follow-Up: At [facility], you and your health needs are our priority.  As part of our continuing mission to provide you with exceptional heart care, we have created designated Provider Care Teams.  These Care Teams include your primary Cardiologist (physician) and Advanced Practice Providers (APPs -  Physician Assistants and Nurse Practitioners) who all work together to provide you with the care you need, when you need it.   Your next appointment:   1 year(s)  Provider:   Bryan Lemma, MD   Other Instructions CONTINUE TO MONITOR BLOOD PRESSURE IF SYSTOLIC IF PERSISTENTLY >135 FOR 2 WEEKS GIVE OFFICE A CALL TO MAKE ADJUSTMENT TO MEDICATION.

## 2023-10-20 ENCOUNTER — Ambulatory Visit (HOSPITAL_COMMUNITY): Payer: Medicare Other | Attending: Physician Assistant

## 2023-10-20 DIAGNOSIS — I7121 Aneurysm of the ascending aorta, without rupture: Secondary | ICD-10-CM | POA: Insufficient documentation

## 2023-10-20 LAB — ECHOCARDIOGRAM COMPLETE
Area-P 1/2: 3.1 cm2
S' Lateral: 3.4 cm

## 2024-01-23 ENCOUNTER — Other Ambulatory Visit: Payer: Self-pay | Admitting: Family Medicine

## 2024-02-10 ENCOUNTER — Encounter: Payer: Self-pay | Admitting: Family Medicine

## 2024-02-17 IMAGING — CT CT CARDIAC CORONARY ARTERY CALCIUM SCORE
3 series · 14 of 20 positions shown, 16 images · non-contrast
Comparison: None Available.
COMPARISON: None Available.

Addendum:
EXAM:
OVER-READ INTERPRETATION  CT CHEST

The following report is a limited chest CT over-read performed by
06/02/2022. The coronary calcium score interpretation by the
cardiologist is attached.
CLINICAL DATA: Cardiovascular Disease Risk stratification
Coronary Calcium Score
TECHNIQUE: A gated, non-contrast computed tomography scan of the heart was
performed using 3mm slice thickness. Axial images were analyzed on a
dedicated workstation. Calcium scoring of the coronary arteries was
performed using the Agatston method.

[Series 2: cascseq 2.0 sa36 70% (id) · axial · 0.43mm/px · z∈[-242,-152]mm · 4 of 76 slices shown]
[im 16/76  vessel]
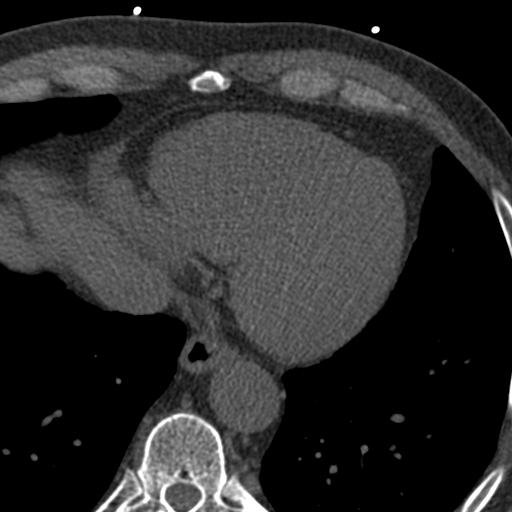
[im 31/76  vessel]
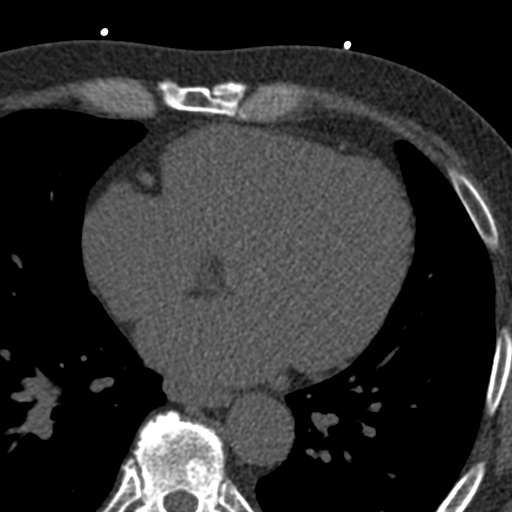
[im 46/76  vessel]
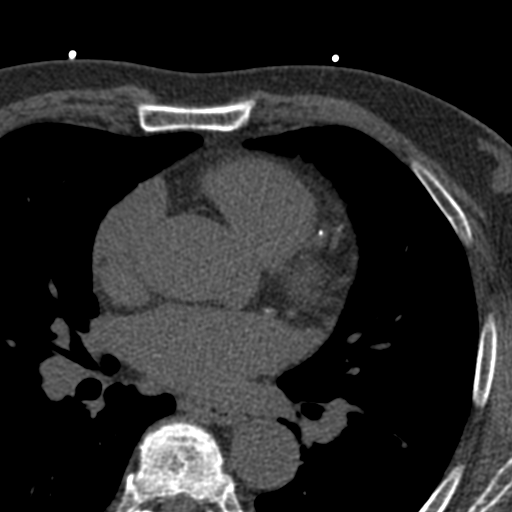
[im 61/76  vessel]
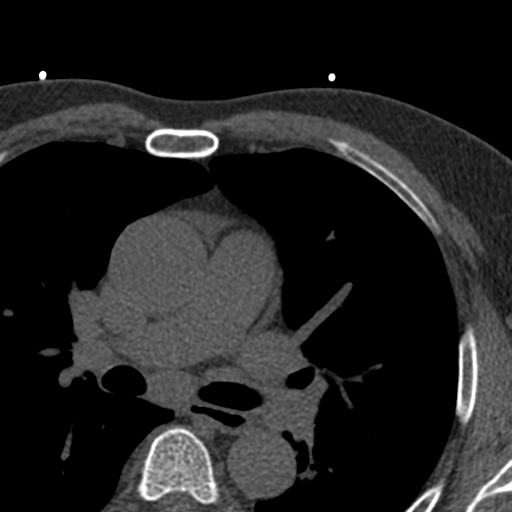

[Series 3: cascseq 2.0 bf37 st · axial · 0.73mm/px · z∈[-248,-148]mm · 5 of 76 slices shown, 7 images]
[im 13/76  vessel]
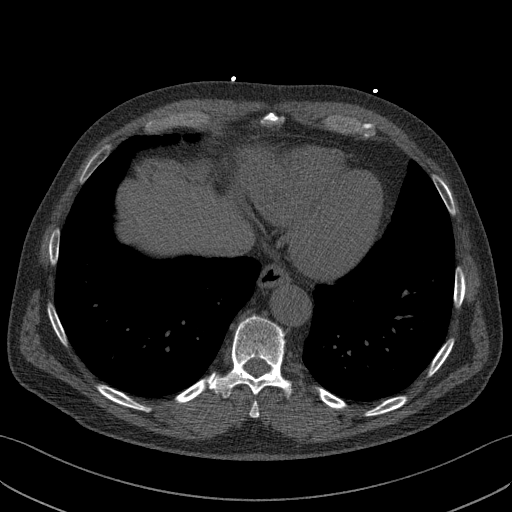
[im 13/76  lung]
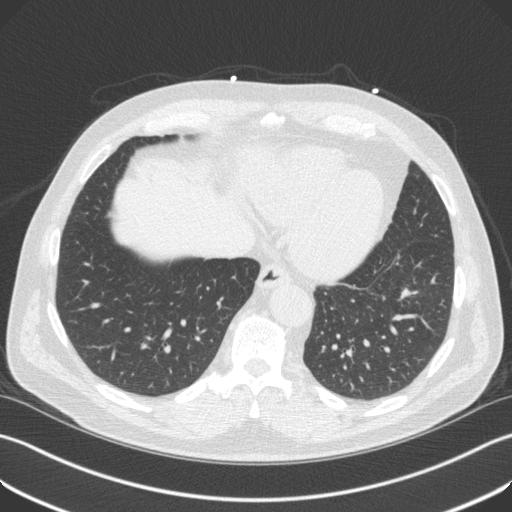
[im 26/76  vessel]
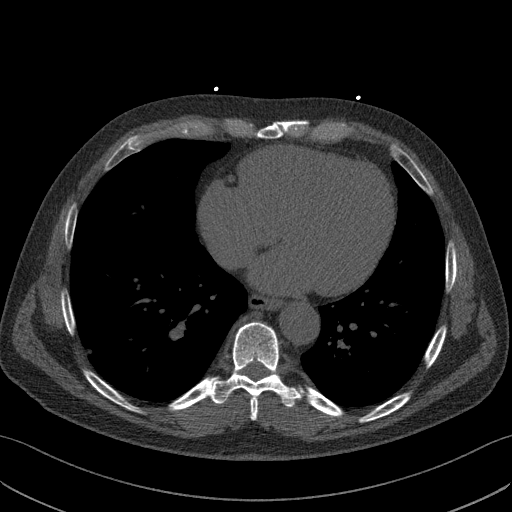
[im 38/76  vessel]
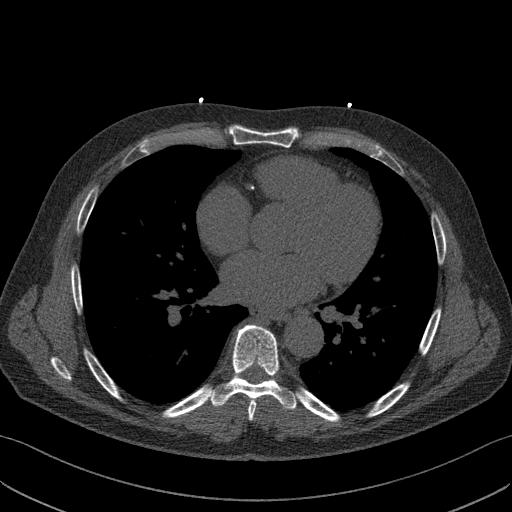
[im 51/76  vessel]
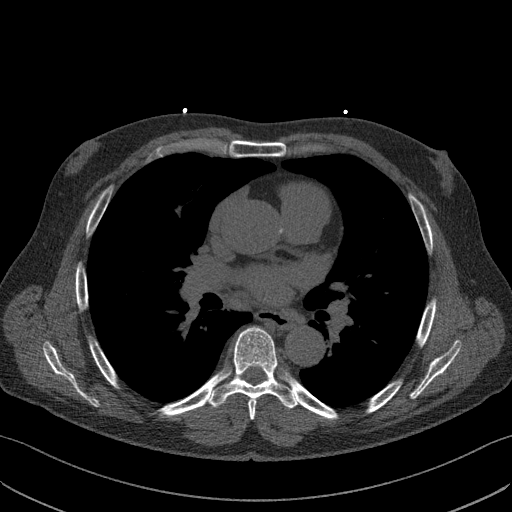
[im 63/76  vessel]
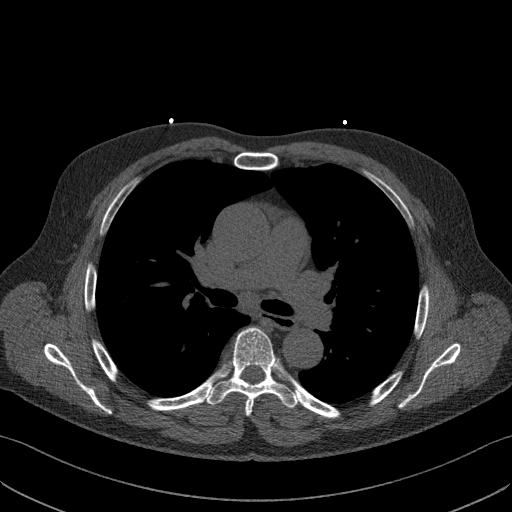
[im 63/76  lung]
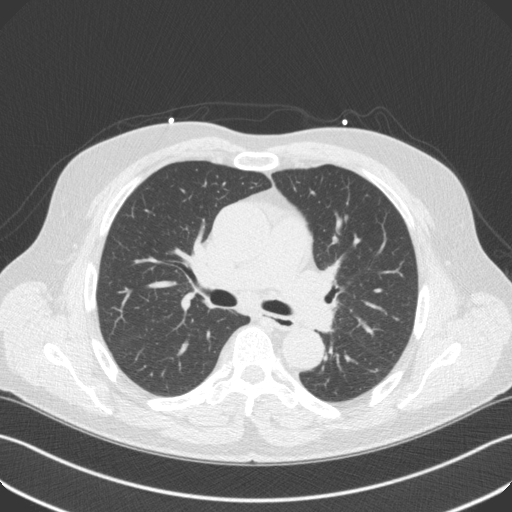

[Series 4: cascseq 2.0 br59 lung · axial · 0.73mm/px · z∈[-248,-148]mm · 5 of 76 slices shown]
[im 13/76  lung]
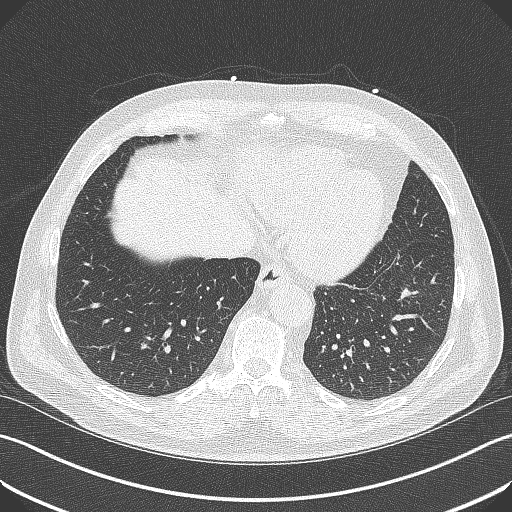
[im 26/76  lung]
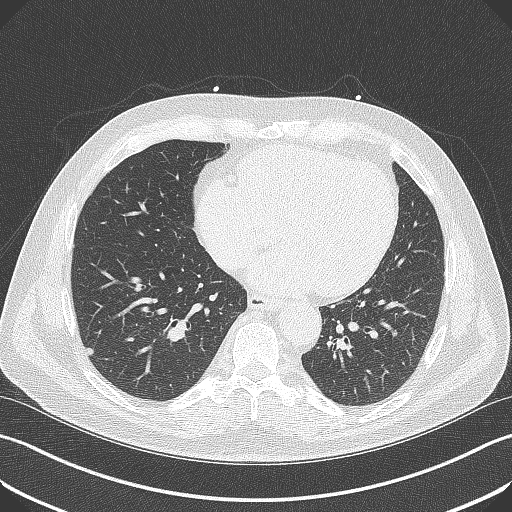
[im 38/76  lung]
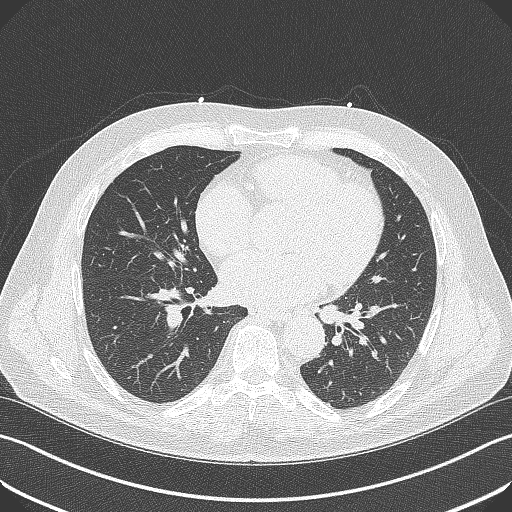
[im 51/76  lung]
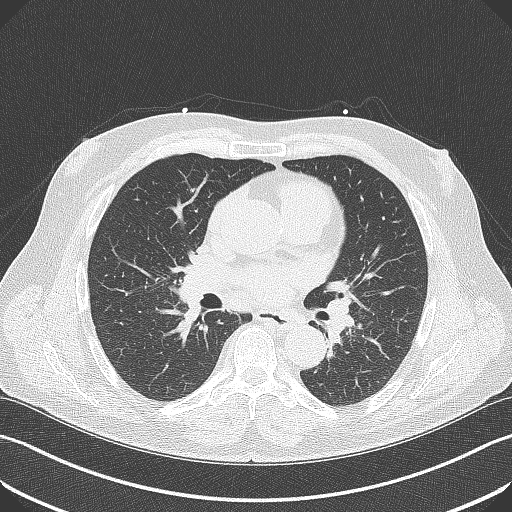
[im 63/76  lung]
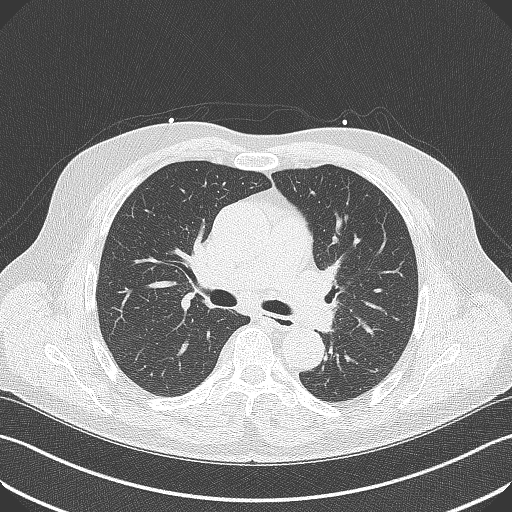

[14 of 20 positions shown; findings below may reference images not displayed]

FINDINGS: Atherosclerotic calcifications in the thoracic aorta. 5 mm
subpleural nodule in the periphery of the right lower lobe (axial
image 51 of series 4). Within the visualized portions of the thorax
there are no other larger more suspicious appearing pulmonary
nodules or masses, there is no acute consolidative airspace disease,
no pleural effusions, no pneumothorax and no lymphadenopathy.
Visualized portions of the upper abdomen are unremarkable. There are
no aggressive appearing lytic or blastic lesions noted in the
visualized portions of the skeleton.
IMPRESSION: 1. 5 mm right lower lobe pulmonary nodule, nonspecific, but
statistically likely benign (likely a subpleural lymph node). No
follow-up needed if patient is low-risk.This recommendation follows
the consensus statement: Guidelines for Management of Incidental
Pulmonary Nodules Detected on CT Images: From the [HOSPITAL]
2.  Aortic Atherosclerosis (FFMWD-M6F.F).
FINDINGS: Coronary arteries: Normal origins.

Coronary Calcium Score:

Left main: 3

Left anterior descending artery: 261

Left circumflex artery: 67

Right coronary artery: 109

Total: 440

Percentile: 81

Pericardium: Normal.

Aorta: Mildly dilated caliber of ascending aorta. Aortic
atherosclerosis noted.

Non-cardiac: See separate report from [REDACTED].
IMPRESSION: Coronary calcium score of 440. This was 81st percentile for age-,
race-, and sex-matched controls. Mildly dilated caliber of ascending
aorta (~40 mm). Aortic atherosclerosis noted.



If CAC=0, it is reasonable to withhold statin therapy and reassess
in 5 to 10 years, as long as higher risk conditions are absent
(diabetes mellitus, family history of premature CHD in first degree
relatives (males <55 years; females <65 years), cigarette smoking,
or LDL >=190 mg/dL).

If CAC is 1 to 99, it is reasonable to initiate statin therapy for
patients >=55 years of age.

If CAC is >=100 or >=75th percentile, it is reasonable to initiate
statin therapy at any age.

Cardiology referral should be considered for patients with CAC
scores >=400 or >=75th percentile.

*9445 AHA/ACC/AACVPR/AAPA/ABC/RACKS/GILCA/MELJOVIC/Brien/SWART/MALDINI/ZUBAIDI
Guideline on the Management of Blood Cholesterol: A Report of the
American College of Cardiology/American Heart Association Task Force
on Clinical Practice Guidelines. J Am Coll Cardiol.
3883;73(24):9348-9580.

*** End of Addendum ***
EXAM:
OVER-READ INTERPRETATION  CT CHEST

The following report is a limited chest CT over-read performed by
06/02/2022. The coronary calcium score interpretation by the
cardiologist is attached.
FINDINGS: Atherosclerotic calcifications in the thoracic aorta. 5 mm
subpleural nodule in the periphery of the right lower lobe (axial
image 51 of series 4). Within the visualized portions of the thorax
there are no other larger more suspicious appearing pulmonary
nodules or masses, there is no acute consolidative airspace disease,
no pleural effusions, no pneumothorax and no lymphadenopathy.
Visualized portions of the upper abdomen are unremarkable. There are
no aggressive appearing lytic or blastic lesions noted in the
visualized portions of the skeleton.
IMPRESSION: 1. 5 mm right lower lobe pulmonary nodule, nonspecific, but
statistically likely benign (likely a subpleural lymph node). No
follow-up needed if patient is low-risk.This recommendation follows
the consensus statement: Guidelines for Management of Incidental
Pulmonary Nodules Detected on CT Images: From the [HOSPITAL]
2.  Aortic Atherosclerosis (FFMWD-M6F.F).

## 2024-02-17 IMAGING — DX DG KNEE AP/LAT W/ SUNRISE*R*
3 series · 3 of 3 positions shown · non-contrast
Comparison: None available

CLINICAL DATA: Right knee pain for 4 weeks. No known injury.
History of arthroscopic right knee surgery.

EXAM:
RIGHT KNEE 3 VIEWS

[knee ap]
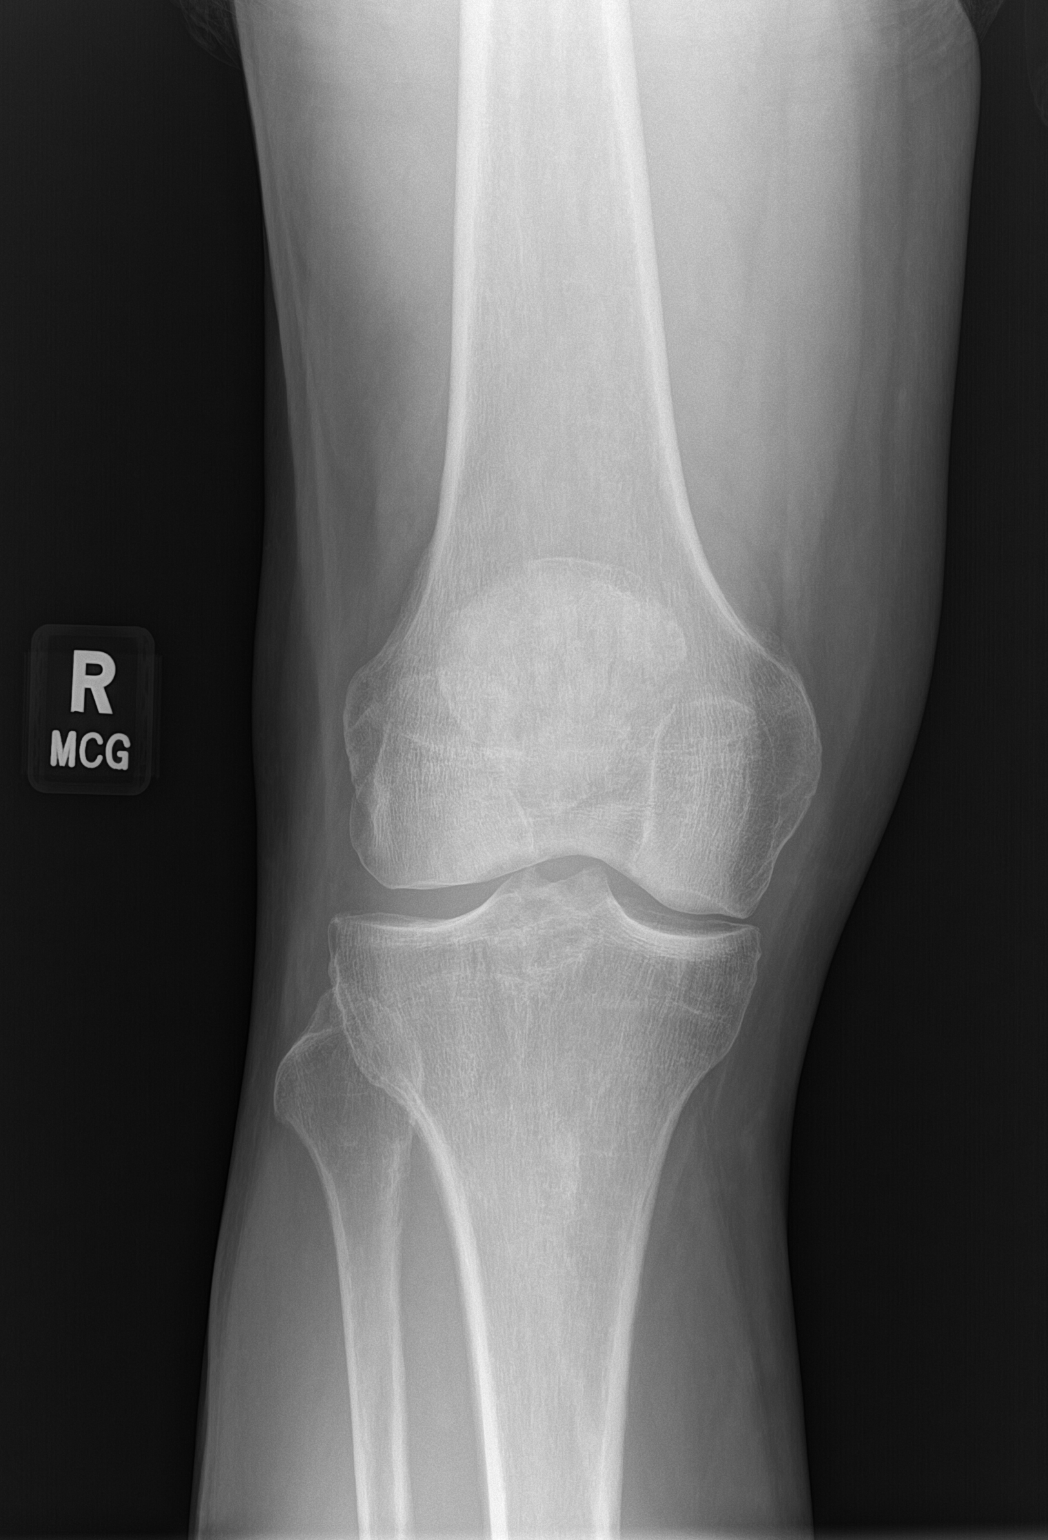

[knee lat]
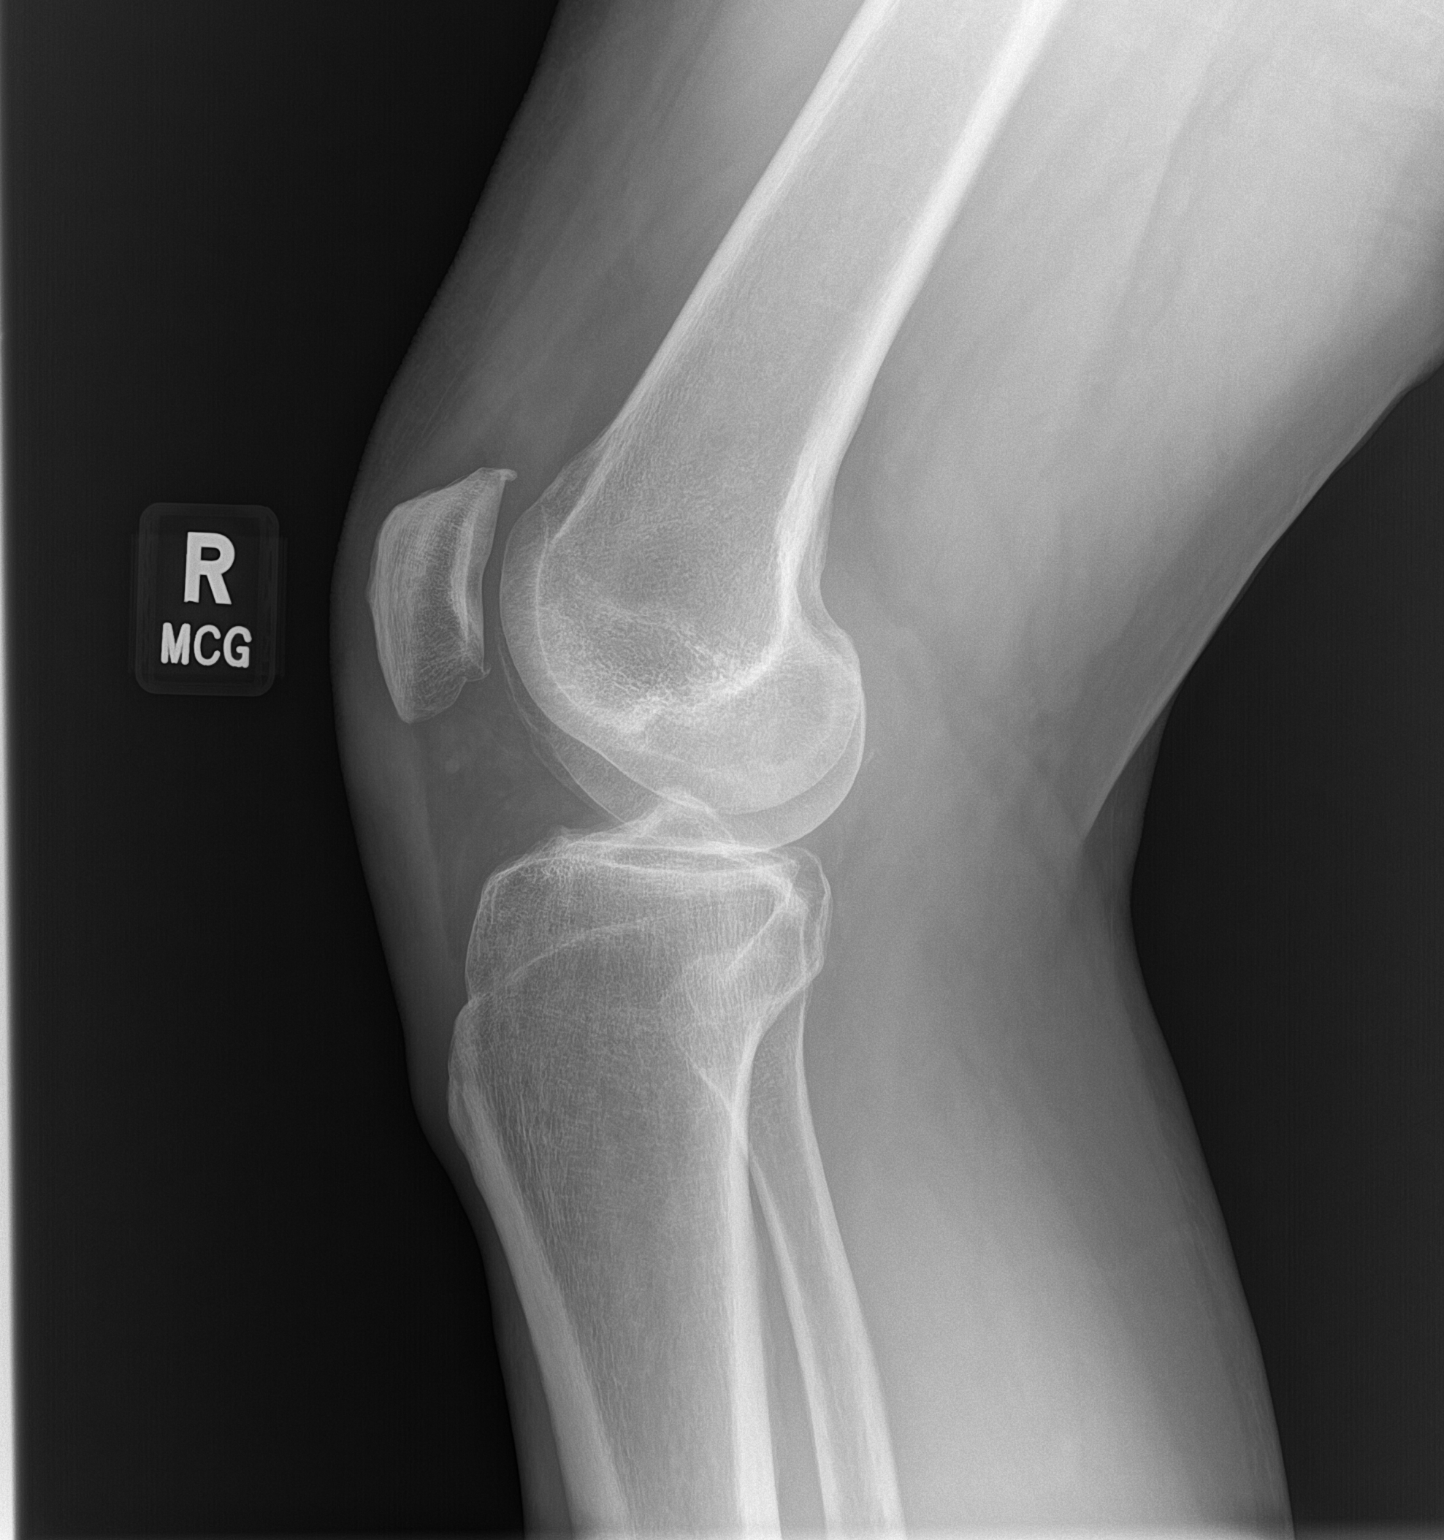

[patella]
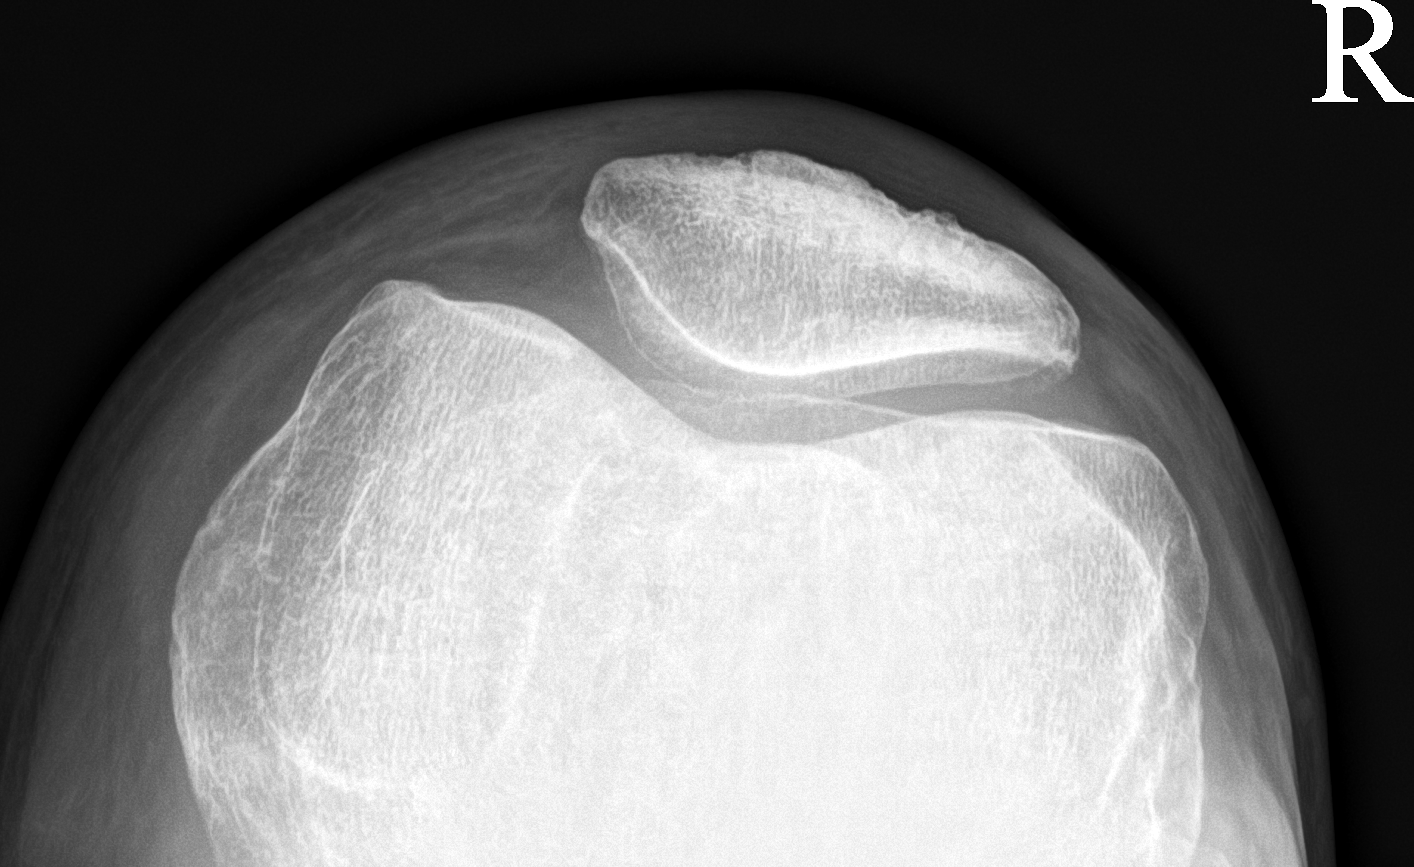

[3 of 3 positions shown; findings below may reference images not displayed]

FINDINGS: Mild-to-moderate medial compartment joint space narrowing. Mild
superior greater than lateral and inferior patellar degenerative
osteophytosis. Tiny joint effusion. No acute fracture is seen. No
dislocation.
IMPRESSION: Mild-to-moderate medial compartment and mild patellofemoral
compartment osteoarthritis.

## 2024-02-28 ENCOUNTER — Ambulatory Visit (INDEPENDENT_AMBULATORY_CARE_PROVIDER_SITE_OTHER): Payer: Medicare Other

## 2024-02-28 VITALS — Ht 68.0 in | Wt 180.0 lb

## 2024-02-28 DIAGNOSIS — Z0000 Encounter for general adult medical examination without abnormal findings: Secondary | ICD-10-CM | POA: Diagnosis not present

## 2024-02-28 NOTE — Patient Instructions (Signed)
 Mr. Patrick [name] , Thank you for taking time to come for your Medicare Wellness Visit. I appreciate your ongoing commitment to your health goals. Please review the following plan we discussed and let me know if I can assist you in the future.   Referrals/Orders/Follow-Ups/Clinician Recommendations: maintain Healthy and activity   This is a list of the screening recommended for you and due dates:  Health Maintenance  Topic Date Due   Colon Cancer Screening  05/17/2023   COVID-19 Vaccine (4 - 2024-25 season) 08/21/2023   Medicare Annual Wellness Visit  02/27/2025   DTaP/Tdap/Td vaccine (3 - Td or Tdap) 06/08/2025   Pneumonia Vaccine  Completed   Flu Shot  Completed   Hepatitis C Screening  Completed   Zoster (Shingles) Vaccine  Completed   HPV Vaccine  Aged Out    Advanced directives: (Copy Requested) Please bring a copy of your health care power of attorney and living will to the office to be added to your chart at your convenience. You can mail to [facility] [address] [place], [place] [address] or email to [email or web address]  Next Medicare Annual Wellness Visit scheduled for next year: Yes

## 2024-02-28 NOTE — Progress Notes (Signed)
 Subjective:   Patrick [name] is a 68 y.o. who presents for a Medicare Wellness preventive visit.  Visit Complete: Virtual I connected with  Patrick [name] on 02/28/24 by a audio enabled telemedicine application and verified that I am speaking with the correct person using two identifiers.  Patient Location: Home  Provider Location: Office/Clinic  I discussed the limitations of evaluation and management by telemedicine. The patient expressed understanding and agreed to proceed.  Vital Signs: Because this visit was a virtual/telehealth visit, some criteria may be missing or patient reported. Any vitals not documented were not able to be obtained and vitals that have been documented are patient reported.  VideoDeclined- This patient declined Librarian, academic. Therefore the visit was completed with audio only.  AWV Questionnaire: Yes: Patient Medicare AWV questionnaire was completed by the patient on 02/24/24; I have confirmed that all information answered by patient is correct and no changes since this date.  Cardiac Risk Factors include: advanced age (>75men, >64 women);dyslipidemia;hypertension;male gender     Objective:    Today's Vitals   02/28/24 0840  Weight: 180 lb (81.6 kg)  Height: 5\' 8"  (1.727 m)   Body mass index is 27.37 kg/m.     02/28/2024    8:43 AM 02/23/2023    8:11 AM 07/28/2020    3:00 PM 07/16/2020   10:04 AM 04/25/2020   12:05 PM 05/16/2018    8:04 AM 11/10/2012    1:31 PM  Advanced Directives  Does Patient Have a Medical Advance Directive? Yes Yes Yes Yes Yes Yes Patient has advance directive, copy not in chart  Type of Advance Directive Healthcare Power of [place];Living will Healthcare Power of [place];Living will Healthcare Power of [place];Living will Healthcare Power of [place];Living will Healthcare Power of [place];Living will Healthcare Power of [place];Living will Healthcare Power of [place];Living will  Does  patient want to make changes to medical advance directive?   No - Guardian declined  No - Patient declined    Copy of Healthcare Power of Attorney in Chart? No - copy requested No - copy requested   No - copy requested No - copy requested   Pre-existing out of facility DNR order (yellow form or pink MOST form)       No    Current Medications (verified) Outpatient Encounter Medications as of 02/28/2024  Medication Sig   aspirin EC 81 MG tablet Take 81 mg by mouth daily. Swallow whole.   losartan (COZAAR) 25 MG tablet TAKE ONE TABLET BY MOUTH EVERY DAY   Multiple Vitamin (MULTIVITAMIN) tablet Take 1 tablet by mouth daily.   Omega-3 Fatty Acids (FISH OIL PO) Take 600 mg by mouth.   rosuvastatin (CRESTOR) 20 MG tablet Take 1 tablet (20 mg total) by mouth daily.   tadalafil (CIALIS) 5 MG tablet 1 times per day   traZODone (DESYREL) 50 MG tablet Take 0.5-1 tablets (25-50 mg total) by mouth at bedtime as needed for sleep.   No facility-administered encounter medications on file as of 02/28/2024.    Allergies (verified) Patient has no known allergies.   History: Past Medical History:  Diagnosis Date   Cancer (HCC)    prostate CA- bx done 11-18, hasn't started treatment   Coronary artery calcification seen on CAT scan 05/24/2022   Coronary Calcium Score (10/2018)- 158.  Mild Calcium in all 3 Epicardial Coronaries.  Slight TA dilation ~4 cm.; 05/2022: Coronary Calcium Score: 440. Left main: , LAD 261, LCx 67, RCA 109.  81st percentile.  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   Hyperlipidemia    Rosuvastatin just ordered, but not yet started.   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   Qualifier: Diagnosis of  By: Lovell Sheehan MD, [name]    Prostate cancer [facility])    Past Surgical History:  Procedure Laterality Date   A/C repair right  years ago   HS football injury   COLONOSCOPY     KNEE ARTHROSCOPY  11/15/2012   meniscus repair- Dr. Despina Hick, right    LYMPHADENECTOMY Bilateral 07/28/2020   Procedure: LYMPHADENECTOMY, PELVIC;  Surgeon: Heloise Purpura, MD;  Location: [facility];  Service: Urology;  Laterality: Bilateral;   PROSTATE BIOPSY     November 2018   ROBOT ASSISTED LAPAROSCOPIC RADICAL PROSTATECTOMY N/A 07/28/2020   Procedure: XI ROBOTIC ASSISTED LAPAROSCOPIC RADICAL PROSTATECTOMY LEVEL 2;  Surgeon: Heloise Purpura, MD;  Location: [facility];  Service: Urology;  Laterality: N/A;   Family History  Problem Relation Age of Onset   Diabetes Mother    Cancer Mother 41       lung cancer-nonsmoker but 2nd hand smoke   Lung cancer Mother        died at 34   Heart disease Father        70s heavy smoker   Prostate cancer Father        70s or 28s   Hyperlipidemia Father    Healthy Sister    Heart disease Brother        very different lifestyle   Heart disease Paternal Grandmother        and several uncles   Colon cancer Neg Hx    Esophageal cancer Neg Hx    Rectal cancer Neg Hx    Stomach cancer Neg Hx    Breast cancer Neg Hx    Social History   Socioeconomic History   Marital status: Married    Spouse name: Not on file   Number of children: Not on file   Years of education: Not on file   Highest education level: Bachelor's degree (e.g., BA, AB, BS)  Occupational History   Occupation: [phone]    Employer: [employer]   Occupation: MECHANICAL EQUIPMENT SALES  Tobacco Use   Smoking status: Never   Smokeless tobacco: Never  Vaping Use   Vaping status: Never Used  Substance and Sexual Activity   Alcohol use: Yes    Comment: occasional   Drug use: No   Sexual activity: Yes  Other Topics Concern   Not on file  Social History Narrative   Married 1979. 2 kids- son in [place] and daughter in [place] with 2 grandkids age 67 and 71 in 2022.       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   Social Drivers of Corporate investment banker Strain: Low Risk  (02/24/2024)   Overall Financial Resource Strain (CARDIA)    Difficulty of Paying Living Expenses: Not hard at all  Food Insecurity: No Food Insecurity (02/24/2024)   Hunger Vital Sign    Worried About Running Out of Food in the Last Year: Never true    Ran Out of Food in the Last Year: Never true  Transportation Needs: No Transportation Needs (02/24/2024)  PRAPARE - Administrator, Civil Service (Medical): No    Lack of Transportation (Non-Medical): No  Physical Activity: Sufficiently Active (02/24/2024)   Exercise Vital Sign    Days of Exercise per Week: 3 days    Minutes of Exercise per Session: 50 min  Stress: No Stress Concern Present (02/24/2024)   [employer] of Occupational Health - Occupational Stress Questionnaire    Feeling of Stress : Only a little  Social Connections: Socially Integrated (02/24/2024)   Social Connection and Isolation Panel [NHANES]    Frequency of Communication with Friends and Family: More than three times a week    Frequency of Social Gatherings with Friends and Family: More than three times a week    Attends Religious Services: 1 to 4 times per year    Active Member of [employer] or Organizations: Yes    Attends Engineer, structural: More than 4 times per year    Marital Status: Married    Tobacco Counseling Counseling given: Not Answered    Clinical Intake:  Pre-visit preparation completed: Yes  Pain : No/denies pain     BMI - recorded: 27.37 Nutritional Status: BMI 25 -29 Overweight Nutritional Risks: None Diabetes: No  How often do you need to have someone help you when you read instructions, pamphlets, or other written materials from your doctor or pharmacy?: 1 - Never  Interpreter Needed?: No  Information entered by :: Lanier Ensign, LPN   Activities of Daily Living      02/28/2024    8:42 AM  In your present state of health, do you have any difficulty performing the following activities:  Hearing? 0  Vision? 0  Difficulty concentrating or making decisions? 0  Walking or climbing stairs? 0  Dressing or bathing? 0  Doing errands, shopping? 0  Preparing Food and eating ? N  Using the Toilet? N  In the past six months, have you accidently leaked urine? N  Do you have problems with loss of bowel control? N  Managing your Medications? N  Managing your Finances? N  Housekeeping or managing your Housekeeping? N    Patient Care Team: Shelva Majestic, MD as PCP - General (Family Medicine) Marykay Lex, MD as PCP - Cardiology (Cardiology) Specialists, Dermatology as Consulting Physician (Dermatology) Larence Penning, OD as Referring Physician (Optometry) Szott, Dallie Piles, DDS as Referring Physician (Dentistry) [facility], [facility] as Consulting Physician (Urology) Margaretmary Dys, MD as Consulting Physician (Radiation Oncology) [facility]  Indicate any recent Medical Services you may have received from other than [facility] providers in the past year (date may be approximate).     Assessment:   This is a routine wellness examination for Patrick [name].  Hearing/Vision screen Hearing Screening - Comments:: Pt denies any hearing issues  Vision Screening - Comments:: Pt follows up with Dr Francesco [facility] for annual eye exams    Goals Addressed             This Visit's Progress    Patient Stated       Maintain health and activity        Depression Screen     02/28/2024    8:45 AM 06/01/2023    8:56 AM 02/23/2023    8:09 AM 11/24/2021    8:04 AM 11/21/2020   11:06 AM 11/03/2018    2:51 PM  PHQ 2/9 Scores  PHQ - 2 Score 0 0 0 0 0 0  PHQ- 9 Score  1        Fall Risk     02/28/2024    8:47 AM 06/01/2023    8:56 AM 06/01/2023    7:49 AM 02/23/2023    6:42 AM 11/01/2019    9:08 AM  Fall Risk   Falls in the  past year? 0 0 0 0 0  Number falls in past yr: 0 0 0 0 0  Injury with Fall? 0 0 0 0 0  Risk for fall due to : No Fall Risks No Fall Risks No Fall Risks Impaired vision   Follow up Falls prevention discussed Falls evaluation completed Falls evaluation completed Falls prevention discussed     MEDICARE RISK AT HOME:  Medicare Risk at Home Any stairs in or around the home?: Yes If so, are there any without handrails?: No Home free of loose throw rugs in walkways, pet beds, electrical cords, etc?: Yes Adequate lighting in your home to reduce risk of falls?: Yes Life alert?: No Use of a cane, walker or w/c?: No Grab bars in the bathroom?: No Shower chair or bench in shower?: No Elevated toilet seat or a handicapped toilet?: No  TIMED UP AND GO:  Was the test performed?  No  Cognitive Function: 6CIT completed        02/23/2023    8:14 AM  6CIT Screen  What Year? 0 points  What month? 0 points  What time? 0 points  Count back from 20 0 points  Months in reverse 0 points  Repeat phrase 0 points  Total Score 0 points    Immunizations Immunization History  Administered Date(s) Administered   Fluad Quad(high Dose 65+) 01/14/2023   Fluad Trivalent(High Dose 65+) 08/31/2023   Influenza Split 10/25/2011, 11/03/2012   Influenza Whole 10/08/2008, 09/23/2009, 09/08/2010   Influenza,inj,Quad PF,6+ Mos 11/09/2013, 10/14/2014, 08/22/2015, 09/16/2017, 10/27/2018, 10/08/2019, 11/21/2020, 10/28/2021   Influenza-Unspecified 09/14/2016   PFIZER(Purple Top)SARS-COV-2 Vaccination 03/06/2020, 03/27/2020, 10/07/2020   PNEUMOCOCCAL CONJUGATE-20 06/01/2023   Td 02/17/2005   Tdap 06/09/2015   Zoster Recombinant(Shingrix) 07/13/2017, 09/16/2017    Screening Tests Health Maintenance  Topic Date Due   Colonoscopy  05/17/2023   COVID-19 Vaccine (4 - 2024-25 season) 08/21/2023   Medicare Annual Wellness (AWV)  02/27/2025   DTaP/Tdap/Td (3 - Td or Tdap) 06/08/2025   Pneumonia Vaccine 65+ Years  old  Completed   INFLUENZA VACCINE  Completed   Hepatitis C Screening  Completed   Zoster Vaccines- Shingrix  Completed   HPV VACCINES  Aged Out    Health Maintenance  Health Maintenance Due  Topic Date Due   Colonoscopy  05/17/2023   COVID-19 Vaccine (4 - 2024-25 season) 08/21/2023   Health Maintenance Items Addressed: See Nurse Notes  Additional Screening:  Vision Screening: Recommended annual ophthalmology exams for early detection of glaucoma and other disorders of the eye.  Dental Screening: Recommended annual dental exams for proper oral hygiene  Community Resource Referral / Chronic Care Management: CRR required this visit?  No   CCM required this visit?  No     Plan:     I have personally reviewed and noted the following in the patient's chart:   Medical and social history Use of alcohol, tobacco or illicit drugs  Current medications and supplements including opioid prescriptions. Patient is not currently taking opioid prescriptions. Functional ability and status Nutritional status Physical activity Advanced directives List of other physicians Hospitalizations, surgeries, and ER visits in previous 12 months Vitals Screenings to include cognitive, depression, and  falls Referrals and appointments  In addition, I have reviewed and discussed with patient certain preventive protocols, quality metrics, and best practice recommendations. A written personalized care plan for preventive services as well as general preventive health recommendations were provided to patient.     Marzella Schlein, LPN   03/28/8118   After Visit Summary: (MyChart) Due to this being a telephonic visit, the after visit summary with patients personalized plan was offered to patient via MyChart   Notes: Nothing significant to report at this time.

## 2024-04-05 ENCOUNTER — Encounter: Payer: Self-pay | Admitting: Family Medicine

## 2024-04-16 ENCOUNTER — Other Ambulatory Visit: Payer: Self-pay | Admitting: Family Medicine

## 2024-05-06 ENCOUNTER — Other Ambulatory Visit: Payer: Self-pay | Admitting: Family Medicine

## 2024-05-07 ENCOUNTER — Other Ambulatory Visit: Payer: Self-pay

## 2024-05-07 ENCOUNTER — Encounter: Payer: Self-pay | Admitting: Family Medicine

## 2024-05-07 MED ORDER — TRAZODONE HCL 50 MG PO TABS: 50.0000 mg | ORAL_TABLET | Freq: Every evening | ORAL | 3 refills | Status: DC | PRN

## 2024-05-08 ENCOUNTER — Other Ambulatory Visit: Payer: Self-pay

## 2024-05-08 MED ORDER — TRAZODONE HCL 50 MG PO TABS: 50.0000 mg | ORAL_TABLET | Freq: Every evening | ORAL | 3 refills | Status: AC | PRN

## 2024-05-08 MED ORDER — TRAZODONE HCL 50 MG PO TABS: 50.0000 mg | ORAL_TABLET | Freq: Every evening | ORAL | 3 refills | Status: DC | PRN

## 2024-06-05 ENCOUNTER — Ambulatory Visit (INDEPENDENT_AMBULATORY_CARE_PROVIDER_SITE_OTHER): Payer: Medicare Other | Admitting: Family Medicine

## 2024-06-05 ENCOUNTER — Encounter: Payer: Self-pay | Admitting: Family Medicine

## 2024-06-05 VITALS — BP 134/76 | HR 60 | Temp 97.2°F | Ht 68.0 in | Wt 184.6 lb

## 2024-06-05 DIAGNOSIS — E785 Hyperlipidemia, unspecified: Secondary | ICD-10-CM

## 2024-06-05 DIAGNOSIS — R739 Hyperglycemia, unspecified: Secondary | ICD-10-CM

## 2024-06-05 DIAGNOSIS — Z131 Encounter for screening for diabetes mellitus: Secondary | ICD-10-CM

## 2024-06-05 DIAGNOSIS — I2510 Atherosclerotic heart disease of native coronary artery without angina pectoris: Secondary | ICD-10-CM | POA: Diagnosis not present

## 2024-06-05 DIAGNOSIS — I10 Essential (primary) hypertension: Secondary | ICD-10-CM

## 2024-06-05 NOTE — Patient Instructions (Addendum)
 Patrick [name] can you send a request for colonoscopy from [facility]  Also help him with POA submission- has originals and we need to get those back to him before he leaves  Please stop by lab before you go If you have mychart- we will send your results within 3 business days of us  receiving them.  If you do not have mychart- we will call you about results within 5 business days of us  receiving them.  *please also note that you will see labs on mychart as soon as they post. I will later go in and write notes on them- will say notes from Patrick [name]   Recommended follow up: Return in about 1 year (around 06/05/2025) for followup or sooner if needed.Schedule b4 you leave.

## 2024-06-05 NOTE — Progress Notes (Signed)
 Phone [phone] In person visit   Subjective:   Patrick [name] is a 68 y.o. year old very pleasant male patient who presents for/with See problem oriented charting Chief Complaint  Patient presents with   Annual Exam    Fasting. Denies issues/ROS.   Hypertension   Past Medical History-  Patient Active Problem List   Diagnosis Date Noted   History of prostate cancer 05/25/2018    Priority: High   Coronary artery calcification seen on CAT scan     Priority: Medium    Hyperglycemia 05/17/2017    Priority: Medium    History of skin cancer 06/15/2016    Priority: Medium    Essential hypertension 06/09/2015    Priority: Medium    Hyperlipidemia LDL goal <70 02/28/2008    Priority: Medium    History of adenomatous polyp of colon 10/27/2018    Priority: Low   Pleural effusion, bilateral 11/19/2022   Right knee pain 06/02/2022   Rupture of UCL of right thumb 02/25/2020   Anxiety 10/27/2018   Rotator cuff syndrome of left shoulder 03/16/2016   Acromioclavicular joint arthritis 03/16/2016   Anterior tibialis tendinitis of left leg 01/02/2015    Medications- reviewed and updated Current Outpatient Medications  Medication Sig Dispense Refill   aspirin EC 81 MG tablet Take 81 mg by mouth daily. Swallow whole.     Coenzyme Q10 (COQ10) 100 MG CAPS      losartan  (COZAAR ) 25 MG tablet TAKE ONE TABLET BY MOUTH EVERY DAY 90 tablet 3   Multiple Vitamin (MULTIVITAMIN) tablet Take 1 tablet by mouth daily.     Omega-3 Fatty Acids (FISH OIL PO) Take 600 mg by mouth.     rosuvastatin  (CRESTOR ) 20 MG tablet TAKE ONE TABLET BY MOUTH EVERY DAY 90 tablet 3   tadalafil (CIALIS) 5 MG tablet 1 times per day     traZODone  (DESYREL ) 50 MG tablet Take 1 tablet (50 mg total) by mouth at bedtime as needed for sleep. 90 tablet 3   No current facility-administered medications for this visit.     Objective:  BP 134/76   Pulse 60   Temp (!) 97.2 F (36.2 C)   Ht 5' 8 (1.727 m)   Wt 184 lb  9.6 oz (83.7 kg)   SpO2 97%   BMI 28.07 kg/m  Gen: NAD, resting comfortably CV: RRR no murmurs rubs or gallops Lungs: CTAB no crackles, wheeze, rhonchi Abdomen: soft/nontender/nondistended/normal bowel sounds. No rebound or guarding.  Ext: trace edema Skin: warm, dry Neuro: grossly normal, moves all extremities    Assessment and Plan   # Social update-lives in [place]  - his property was ok after fllood  # History of prostate cancer-follows up with urology regularly and PSA trend has been low risk for recurrence after prior radical prostatectomy  -had visit in October and plans for annual at this point with PSA with them  # Aortic dilation noted on CT-we plan to avoid quinolones but on echocardiogram 10/20/2023 no dilation was noted  -History of pericardial effusion and pleural effusion related to coxsackie infection but resolved   # Nonobstructive coronary artery disease-follows with Dr. Janine Melbourne calcium  440 in June 2023 with normal echocardiogram November 2024 #hyperlipidemia with LDL goal under 70 S: Medication:Rosuvastatin  20 mg daily, coq10 (finds helpful) #s up slightly  last check but had been doing every other day and now doing daily, also aspirin every other day. Also fih oil regularly -Lipoprotein a- check today  Lab Results  Component Value Date   CHOL 161 08/31/2023   HDL 56 08/31/2023   LDLCALC 85 08/31/2023   LDLDIRECT 74.0 02/10/2021   TRIG 111 08/31/2023   CHOLHDL 2.9 08/31/2023  A/P: nonobstructive coronary artery disease noted but asymptomatic - continue to monitor  Lipids slightly high previously- hoping for improvement as now doing daily rosuvastatin  instead of every other day -some muscle tightness with more consistency on the medicine - yoga has counteracted some of this thankfully  #hypertension S: medication: Losartan  25 mg Home readings #s: no recent checks- 125-150 range in the past average 135 BP Readings from Last 3 Encounters:   06/05/24 134/76  09/28/23 (!) 142/76  06/01/23 128/78  A/P: improved on repeat- continue current medications reasonable control under 135/85   # Hyperglycemia/insulin resistance/prediabetes-2022 A1c of 5.7 S:  Medication: None Lab Results  Component Value Date   HGBA1C 5.5 06/01/2023   HGBA1C 5.7 11/23/2021   HGBA1C 5.5 11/12/2020  A/P: better last year but update today    # Erectile dysfunction-tadalafil 5 mg daily available but not needing  # Insomnia-finds trazodone  helpful- half tablet nightly   # History of skin cancer-follows up with dermatology regularly- moved into [place] for this- upcoming appointment   # Health maintenance - Exercise- still doing yoga and getting some hikes in and very active on his property  -Weight management- within 5 lbs of last year- usually hangs out around 180 with some fluctuations Wt Readings from Last 3 Encounters:  06/05/24 184 lb 9.6 oz (83.7 kg)  02/28/24 180 lb (81.6 kg)  09/28/23 180 lb 12.8 oz (82 kg)  -Vaccinations- does fall flu shot , holding off on covid Immunization History  Administered Date(s) Administered   Fluad Quad(high Dose 65+) 01/14/2023   Fluad Trivalent(High Dose 65+) 08/31/2023   Influenza Split 10/25/2011, 11/03/2012   Influenza Whole 10/08/2008, 09/23/2009, 09/08/2010   Influenza,inj,Quad PF,6+ Mos 11/09/2013, 10/14/2014, 08/22/2015, 09/16/2017, 10/27/2018, 10/08/2019, 11/21/2020, 10/28/2021   Influenza-Unspecified 09/14/2016   PFIZER(Purple Top)SARS-COV-2 Vaccination 03/06/2020, 03/27/2020, 10/07/2020   PNEUMOCOCCAL CONJUGATE-20 06/01/2023   Td 02/17/2005   Tdap 06/09/2015   Zoster Recombinant(Shingrix ) 07/13/2017, 09/16/2017  -Cancer screening-see above plus -[place]- requesting  -Never smoker   Recommended follow up: Return in about 1 year (around 06/05/2025) for followup or sooner if needed.Schedule b4 you leave. Future Appointments  Date Time Provider Department Center   03/04/2025  8:40 AM [facility] ANNUAL WELLNESS VISIT 1 [facility]    Lab/Order associations:   ICD-10-CM   1. Essential hypertension  I10     2. Hyperglycemia  R73.9     3. Coronary artery calcification seen on CAT scan  I25.10     4. Screening for diabetes mellitus  Z13.1     5. Hyperlipidemia LDL goal <70  E78.5       No orders of the defined types were placed in this encounter.   Return precautions advised.  Clarisa Crooked, MD

## 2024-06-06 ENCOUNTER — Ambulatory Visit: Payer: Self-pay | Admitting: Family Medicine

## 2024-06-06 DIAGNOSIS — E785 Hyperlipidemia, unspecified: Secondary | ICD-10-CM

## 2024-06-06 LAB — COMPREHENSIVE METABOLIC PANEL WITH GFR
ALT: 41 U/L (ref 9–46)
Alkaline phosphatase (APISO): 60 U/L (ref 35–144)
Calcium: 9.4 mg/dL (ref 8.6–10.3)
Chloride: 104 mmol/L (ref 98–110)
Glucose, Bld: 98 mg/dL (ref 65–99)
Total Protein: 7 g/dL (ref 6.1–8.1)

## 2024-06-06 LAB — CBC WITH DIFFERENTIAL/PLATELET
Absolute Lymphocytes: 842 {cells}/uL — ABNORMAL LOW (ref 850–3900)
Basophils Relative: 0.7 %
Eosinophils Relative: 3.9 %
MCHC: 32.9 g/dL (ref 32.0–36.0)
MPV: 11 fL (ref 7.5–12.5)
Platelets: 188 10*3/uL (ref 140–400)
RBC: 5.02 10*6/uL (ref 4.20–5.80)
Total Lymphocyte: 18.3 %

## 2024-06-06 LAB — HEMOGLOBIN A1C: eAG (mmol/L): 6.5 mmol/L

## 2024-06-09 LAB — COMPREHENSIVE METABOLIC PANEL WITH GFR
AG Ratio: 1.8 (calc) (ref 1.0–2.5)
AST: 25 U/L (ref 10–35)
Albumin: 4.5 g/dL (ref 3.6–5.1)
BUN: 13 mg/dL (ref 7–25)
CO2: 25 mmol/L (ref 20–32)
Creat: 0.71 mg/dL (ref 0.70–1.35)
Globulin: 2.5 g/dL (ref 1.9–3.7)
Potassium: 5 mmol/L (ref 3.5–5.3)
Sodium: 138 mmol/L (ref 135–146)
Total Bilirubin: 0.8 mg/dL (ref 0.2–1.2)
eGFR: 101 mL/min/{1.73_m2} (ref >=60)

## 2024-06-09 LAB — LIPID PANEL
Cholesterol: 161 mg/dL (ref <200)
HDL: 59 mg/dL (ref >=40)
LDL Cholesterol (Calc): 80 mg/dL
Non-HDL Cholesterol (Calc): 102 mg/dL (ref <130)
Total CHOL/HDL Ratio: 2.7 (calc) (ref <5.0)
Triglycerides: 120 mg/dL (ref <150)

## 2024-06-09 LAB — CBC WITH DIFFERENTIAL/PLATELET
Absolute Monocytes: 460 {cells}/uL (ref 200–950)
Basophils Absolute: 32 {cells}/uL (ref 0–200)
Eosinophils Absolute: 179 {cells}/uL (ref 15–500)
HCT: 47.1 % (ref 38.5–50.0)
Hemoglobin: 15.5 g/dL (ref 13.2–17.1)
MCH: 30.9 pg (ref 27.0–33.0)
MCV: 93.8 fL (ref 80.0–100.0)
Monocytes Relative: 10 %
Neutro Abs: 3087 {cells}/uL (ref 1500–7800)
Neutrophils Relative %: 67.1 %
RDW: 12.7 % (ref 11.0–15.0)
WBC: 4.6 10*3/uL (ref 3.8–10.8)

## 2024-06-09 LAB — HEMOGLOBIN A1C
Hgb A1c MFr Bld: 5.7 % — ABNORMAL HIGH (ref <5.7)
Mean Plasma Glucose: 117 mg/dL

## 2024-06-09 LAB — LIPOPROTEIN A (LPA): Lipoprotein (a): 29 nmol/L (ref <75)

## 2024-10-01 ENCOUNTER — Encounter: Payer: Self-pay | Admitting: Cardiology

## 2024-10-01 ENCOUNTER — Ambulatory Visit: Attending: Cardiology | Admitting: Cardiology

## 2024-10-01 VITALS — BP 139/80 | HR 59 | Resp 16 | Ht 68.0 in | Wt 183.2 lb

## 2024-10-01 DIAGNOSIS — I7121 Aneurysm of the ascending aorta, without rupture: Secondary | ICD-10-CM

## 2024-10-01 DIAGNOSIS — I2510 Atherosclerotic heart disease of native coronary artery without angina pectoris: Secondary | ICD-10-CM | POA: Diagnosis present

## 2024-10-01 DIAGNOSIS — I77810 Thoracic aortic ectasia: Secondary | ICD-10-CM | POA: Diagnosis present

## 2024-10-01 DIAGNOSIS — I10 Essential (primary) hypertension: Secondary | ICD-10-CM | POA: Insufficient documentation

## 2024-10-01 DIAGNOSIS — E785 Hyperlipidemia, unspecified: Secondary | ICD-10-CM | POA: Diagnosis present

## 2024-10-01 DIAGNOSIS — R0609 Other forms of dyspnea: Secondary | ICD-10-CM | POA: Insufficient documentation

## 2024-10-01 MED ORDER — EZETIMIBE 10 MG PO TABS: 10.0000 mg | ORAL_TABLET | Freq: Every day | ORAL | 3 refills | Status: AC

## 2024-10-01 MED ORDER — ROSUVASTATIN CALCIUM 20 MG PO TABS: ORAL_TABLET | ORAL | Status: AC

## 2024-10-01 NOTE — Progress Notes (Signed)
 " Cardiology Office Note:  .   Date:  10/03/2024  ID:  Patrick [name], DOB 12-16-1956, MRN [id number] PCP: Katrinka Garnette KIDD, MD  [facility] Providers Cardiologist:  Patrick Clay, MD     Chief Complaint  Patient presents with   Coronary artery calcification seen on CAT scan    Patient Profile: .     Patrick [name] is a relatively healthy 68 y.o. male with a PMH notable for mildly elevated CAC score, HTN, HLD and family history of premature CAD who presents here for annual follow-up to discuss some symptoms of fatigue and exertional dyspnea.   He was originally referred at the request of Katrinka Garnette KIDD, MD.  I last saw him in October 2023 to discuss results of his echo and Coronary Calcium  Score/Coronary CTA.  At that time he was relatively asymptomatic, so we stuck with the decision to forego invasive ischemic evaluation.  Interestingly, when he went for his Coronary CTA (on December 09, 2022) if there was evidence of  large bilateral (R>L) pleural effusions as well as a moderate pericardial effusion.  This was noted by my partner Dr. Kate who recommended the patient go to the ER for evaluation.  Apparently he had recently had a bout of pneumonia and had 2 courses of antibiotics but the symptoms still or not fully resolved with some dyspnea persistent.  He recommended ER evaluation with plans for echocardiogram. => He went to the ER on November 19, 2022 and had an echocardiogram done which actually showed a small pericardial effusion that did not require urgent treatment.  He was discharged and referred to pulmonary medicine.  He was seen by Dr. Alaine on December 20.  He eventually diagnosed with having had coxsackie B infection which likely was the cause of his pneumonia all along.  Repeat chest x-ray did not show significant effusions.  Unfortunately, he never made it back for a follow-up appointment to discuss the results of his Coronary CTA until October 2024     Recent  Cardiology Visits Patrick [name] was finally seen by Patrick Ford, PA on September 28, 2023.  At that time he was doing well with no chest pain or dyspnea.  He said it took him 6 months to get over the coxsackie infection with pleural effusions.  Was finally starting to feel better and getting back to doing everyday activities without having dyspnea or fatigue.  They reviewed his CT scan and echocardiogram.  A follow-up echocardiogram was ordered to reassess his thoracic aorta.  LDL was 85, and he noted that he was not taking his Crestor  every day-taking it every other day.  However based on the blood work he decided to start taking on a daily basis.  BPs were little higher in the office than they usually were at home. => Echo ordered.  No medication changes made simply recommended staying on 20 mg Crestor .  Also mentioned that if his systolic pressures averaged persistently over 135 systolic, he should increase his losartan  to 50 mg daily.  Subjective  Discussed the use of AI scribe software for clinical note transcription with the patient, who gave verbal consent to proceed.  History of Present Illness Patrick [name] is a 68 year old male with coronary artery disease who presents for follow-up regarding his cardiovascular health and medication management.  He has a history of coronary artery disease with a coronary calcification score of 484. Prior imaging has shown plaque in the mid LAD,  proximal LAD, circumflex, and RCA. He underwent a coronary CT angiogram in December 2023. He manages his condition with medications and lifestyle modifications.  He experienced a significant viral illness due to Coxsackie B virus, leading to pneumonia and moderate bilateral pleural effusions. This episode mimicked a heart attack, prompting an emergency room visit. An echocardiogram at that time showed a small pericardial effusion and a large pleural effusion on the left side, with an ejection fraction of  55-60%.  He is on losartan  and rosuvastatin  for cardiovascular health, taking losartan  and aspirin every other day and rosuvastatin  20 mg. He experiences muscle cramps, particularly in his legs, which he suspects may be related to his medication. His LDL cholesterol was last measured at 80 mg/dL in June 2025, with a target of less than 70 mg/dL.  He experiences shortness of breath, attributing it to age and reduced physical activity following his illness. He finds it difficult to perform activities such as hiking without needing to stop to catch his breath. No chest pain or pressure with rest or exertion.Patrick [name]  His blood pressure readings have been variable, with recent measurements ranging from 117/80 to 162/unknown, which he attributes to stress and situational factors.  Cardiovascular ROS: positive for - dyspnea on exertion and really just exercise intolerance and fatigue.  Also muscle cramping better with stopping rosuvastatin . negative for - chest pain, edema, irregular heartbeat, orthopnea, palpitations, paroxysmal nocturnal dyspnea, rapid heart rate, or syncope or near syncope, TIA or emesis fugax, claudication  ROS:  Review of Systems - Negative except symptoms above.    Objective   Medications: Losartan  25 mg daily, rosuvastatin  20 mg daily, aspirin 81 mg daily, co-Q10 and fish oil  Studies Reviewed: Patrick [name]   EKG Interpretation Date/Time:  Monday October 01 2024 09:27:28 EDT Ventricular Rate:  60 PR Interval:  182 QRS Duration:  100 QT Interval:  386 QTC Calculation: 386 R Axis:   -69  Text Interpretation: Normal sinus rhythm Left axis deviation Inferior infarct (cited on or before 01-Oct-2024) Anterior infarct (cited on or before 01-Oct-2024) When compared with ECG of 28-Sep-2023 10:41, No significant change since last tracing Confirmed by Patrick [name] ([id number]) on 10/01/2024 10:05:21 AM    Lab Results  Component Value Date   CHOL 161 06/05/2024   HDL 59 06/05/2024   LDLCALC 80  06/05/2024   LDLDIRECT 74.0 02/10/2021   TRIG 120 06/05/2024   CHOLHDL 2.7 06/05/2024   Lab Results  Component Value Date   NA 138 06/05/2024   CL 104 06/05/2024   K 5.0 06/05/2024   CO2 25 06/05/2024   BUN 13 06/05/2024   CREATININE 0.71 06/05/2024   EGFR 101 06/05/2024   CALCIUM  9.4 06/05/2024   ALBUMIN 4.4 08/31/2023   GLUCOSE 98 06/05/2024    Results LABS LDL: 80 (05/2024)  RADIOLOGY Coronary CT Angiogram: Coronary calcification score 484, moderate plaque in mid LAD, mild plaque in proximal LAD (CT FFR 0.81-borderline) and circumflex, minimal plaque in RCA (no RCA or LCx stenosis noted on CT FFR), thoracic aorta slightly enlarged (41 mm).  (11/2022)  DIAGNOSTIC Echocardiogram (11/2022): Normal LV function with EF of 55 to 60%.  No RWMA.  G1 DD.  Small pericardial effusion with a large left pleural effusion.  Ascending aorta measured 41 mm.  Normal atrial sizes and pressures.   Echocardiogram (09/2023): Normal LV size and function with EF 60-65%, no RWMA, aortic and mitral valves normal, no pleural or pericardial effusions  Risk Assessment/Calculations:  Physical Exam:   VS:  BP 139/80 (BP Location: Left Arm, Patient Position: Sitting, Cuff Size: Normal)   Pulse (!) 59   Resp 16   Ht 5' 8 (1.727 m)   Wt 183 lb 3.2 oz (83.1 kg)   SpO2 97%   BMI 27.86 kg/m    Wt Readings from Last 3 Encounters:  10/01/24 183 lb 3.2 oz (83.1 kg)  06/05/24 184 lb 9.6 oz (83.7 kg)  02/28/24 180 lb (81.6 kg)    GEN: Well nourished, well groomed in no acute distress; healthy appearing NECK: No JVD; No carotid bruits CARDIAC: Normal S1, S2; RRR, no murmurs, rubs, gallops RESPIRATORY:  Clear to auscultation without rales, wheezing or rhonchi ; nonlabored, good air movement. ABDOMEN: Soft, non-tender, non-distended EXTREMITIES:  No edema; No deformity      ASSESSMENT AND PLAN: .    Problem List Items Addressed This Visit       Cardiology Problems   Coronary artery  disease, non-occlusive - Primary (Chronic)   Moderate (per CT FFR and nonischemic) plaque in mid LAD, mild plaque in proximal LAD and circumflex, minimal plaque in RCA.  Coronary calcification score 484. CTFFR borderline but not intervention-worthy.  LDL at 80 mg/dL, target <29 mg/dL. Muscle cramps possibly from rosuvastatin . - Adjust rosuvastatin  to 20 mg every other day, alternating with 10 mg. - Monitor for muscle cramps and adjust medication as needed. - Add ezetimibe  10 mg daily.  - Order treadmill stress test to evaluate exercise tolerance and blood pressure response => to evaluate for LAD ischemia      Relevant Medications   ezetimibe  (ZETIA ) 10 MG tablet   rosuvastatin  (CRESTOR ) 20 MG tablet   Other Relevant Orders   EKG 12-Lead (Completed)   EXERCISE TOLERANCE TEST (ETT)   Essential hypertension (Chronic)   Variable blood pressure, recent readings 117/80 to 162/?. On losartan  25 mg daily. - Continue losartan . - Evaluate blood pressure response during treadmill stress test. - Consider increasing losartan  to 50 mg if blood pressure remains elevated.      Relevant Medications   ezetimibe  (ZETIA ) 10 MG tablet   rosuvastatin  (CRESTOR ) 20 MG tablet   Hyperlipidemia LDL goal <70 (Chronic)   Most recent LDL was 80.  He acknowledges that he has not really been taking his Crestor  quite regularly. Recommend that he takes Crestor  alternating 10 mg and 20 mg every other day.  Recommend that we start Zetia  10 mg daily for additional benefit.      Relevant Medications   ezetimibe  (ZETIA ) 10 MG tablet   rosuvastatin  (CRESTOR ) 20 MG tablet   Thoracic aortic ectasia (Chronic)   Aortic measurement stable at 41 mm. - Continue routine monitoring of aortic size-but can recheck in 2 to 3 years..      Relevant Medications   ezetimibe  (ZETIA ) 10 MG tablet   rosuvastatin  (CRESTOR ) 20 MG tablet     Other   DOE (dyspnea on exertion)   Likely related to deconditioning post-viral illness  and pneumonia. No chest pain. - Order treadmill stress test to assess exercise tolerance and rule out cardiac causes.      Relevant Orders   EXERCISE TOLERANCE TEST (ETT)         Informed Consent   Shared Decision Making/Informed Consent The risks [chest pain, shortness of breath, cardiac arrhythmias, dizziness, blood pressure fluctuations, myocardial infarction, stroke/transient ischemic attack, and life-threatening complications (estimated to be 1 in 10,000)], benefits (risk stratification, diagnosing coronary artery disease, treatment guidance) and alternatives  of an exercise tolerance test were discussed in detail with Patrick [name] and he agrees to proceed.      Follow-Up: Return in about 1 year (around 10/01/2025) for Routine follow up with me; will be called back sooner if his treadmill stress test is abnormal..  I spent 67 minutes in the care of Patrick [name] today including reviewing labs (2 minutes), reviewing studies (Coronary CTA and echocardiogram reviewed as well as CT scan and chest x-rays-8 minutes), face to face time discussing treatment options (30 minutes), reviewing records from APP clinic notes, pulmonary clinic notes and hospitalization notes from December 2023 (12 minutes), 15 minutes dictating, and documenting in the encounter.  I have not seen him in over 2 years with significant episodes happening 1 year ago.    Signed, Patrick MICAEL Clay, MD, MS Patrick [name], M.D., M.S. Interventional Cardiologist  [facility] Pager # [phone]      "

## 2024-10-01 NOTE — Patient Instructions (Addendum)
 Medication Instructions:   Try taking Rosuvastatin  10 mg  and 20 mg alternating days Start Zetia 10 mg  daily  *If you need a refill on your cardiac medications before your next appointment, please call your pharmacy*   Lab Work: Not needed    Testing/Procedures:  Will schedule be at [address] [facility] Your physician has requested that you have an exercise tolerance test.. An exercise tolerance test is a test to check how your heart works during exercise. You will need to walk on a treadmill or for this test. An electrocardiogram (ECG) will record your heartbeat when you are at rest and when you are exercising.  Please also follow instruction sheet, as given.   Do not drink or eat foods with caffeine for 24 hours before the test. (Chocolate, coffee, tea, or energy drinks) If you use an inhaler, bring it with you to the test. Do not smoke for 4 hours before the test. Wear comfortable shoes and clothing.    Follow-Up: At [facility], you and your health needs are our priority.  As part of our continuing mission to provide you with exceptional heart care, we have created designated Provider Care Teams.  These Care Teams include your primary Cardiologist (physician) and Advanced Practice Providers (APPs -  Physician Assistants and Nurse Practitioners) who all work together to provide you with the care you need, when you need it.     Your next appointment:   12 month(s)  The format for your next appointment:   In Person  Provider:   Alm Clay, MD

## 2024-10-03 DIAGNOSIS — I77810 Thoracic aortic ectasia: Secondary | ICD-10-CM | POA: Insufficient documentation

## 2024-10-03 NOTE — Assessment & Plan Note (Signed)
 Likely related to deconditioning post-viral illness and pneumonia. No chest pain. - Order treadmill stress test to assess exercise tolerance and rule out cardiac causes.

## 2024-10-03 NOTE — Assessment & Plan Note (Signed)
 Aortic measurement stable at 41 mm. - Continue routine monitoring of aortic size-but can recheck in 2 to 3 years.SABRA

## 2024-10-03 NOTE — Assessment & Plan Note (Signed)
 Moderate (per CT FFR and nonischemic) plaque in mid LAD, mild plaque in proximal LAD and circumflex, minimal plaque in RCA.  Coronary calcification score 484. CTFFR borderline but not intervention-worthy.  LDL at 80 mg/dL, target <29 mg/dL. Muscle cramps possibly from rosuvastatin . - Adjust rosuvastatin  to 20 mg every other day, alternating with 10 mg. - Monitor for muscle cramps and adjust medication as needed. - Add ezetimibe 10 mg daily.  - Order treadmill stress test to evaluate exercise tolerance and blood pressure response => to evaluate for LAD ischemia

## 2024-10-03 NOTE — Assessment & Plan Note (Signed)
 Variable blood pressure, recent readings 117/80 to 162/?. On losartan  25 mg daily. - Continue losartan . - Evaluate blood pressure response during treadmill stress test. - Consider increasing losartan  to 50 mg if blood pressure remains elevated.

## 2024-10-03 NOTE — Assessment & Plan Note (Signed)
 Most recent LDL was 80.  He acknowledges that he has not really been taking his Crestor  quite regularly. Recommend that he takes Crestor  alternating 10 mg and 20 mg every other day.  Recommend that we start Zetia 10 mg daily for additional benefit.

## 2024-10-15 ENCOUNTER — Encounter (HOSPITAL_COMMUNITY): Payer: Self-pay | Admitting: *Deleted

## 2024-10-24 ENCOUNTER — Ambulatory Visit (HOSPITAL_COMMUNITY)
Admission: RE | Admit: 2024-10-24 | Discharge: 2024-10-24 | Disposition: A | Discharge status: Home / Self Care | Source: Ambulatory Visit | Attending: Cardiology | Admitting: Cardiology

## 2024-10-24 ENCOUNTER — Ambulatory Visit: Payer: Self-pay | Admitting: Family Medicine

## 2024-10-24 ENCOUNTER — Other Ambulatory Visit (INDEPENDENT_AMBULATORY_CARE_PROVIDER_SITE_OTHER)

## 2024-10-24 DIAGNOSIS — I2510 Atherosclerotic heart disease of native coronary artery without angina pectoris: Secondary | ICD-10-CM | POA: Insufficient documentation

## 2024-10-24 DIAGNOSIS — E785 Hyperlipidemia, unspecified: Secondary | ICD-10-CM

## 2024-10-24 DIAGNOSIS — R0609 Other forms of dyspnea: Secondary | ICD-10-CM | POA: Diagnosis present

## 2024-10-24 LAB — EXERCISE TOLERANCE TEST
Angina Index: 0
Duke Treadmill Score: 11
Estimated workload: 12.6
Exercise duration (min): 10 min
Exercise duration (sec): 32 s
MPHR: 153 {beats}/min
Peak HR: 146 {beats}/min
Percent HR: 95 %
Rest HR: 65 {beats}/min
ST Depression (mm): 0 mm

## 2024-10-24 LAB — LIPID PANEL
Cholesterol: 138 mg/dL (ref 0–200)
HDL: 56.6 mg/dL (ref >39.00)
LDL Cholesterol: 65 mg/dL (ref 0–99)
NonHDL: 81.57
Total CHOL/HDL Ratio: 2
Triglycerides: 82 mg/dL (ref 0.0–149.0)
VLDL: 16.4 mg/dL (ref 0.0–40.0)

## 2024-10-25 ENCOUNTER — Encounter: Payer: Self-pay | Admitting: Cardiology

## 2024-10-25 ENCOUNTER — Ambulatory Visit: Payer: Self-pay | Admitting: Cardiology

## 2024-12-25 ENCOUNTER — Other Ambulatory Visit: Payer: Self-pay | Admitting: Family Medicine

## 2025-02-04 ENCOUNTER — Ambulatory Visit: Admitting: Family Medicine

## 2025-02-25 ENCOUNTER — Ambulatory Visit: Admitting: Family Medicine

## 2025-02-28 ENCOUNTER — Ambulatory Visit: Admitting: Family Medicine

## 2025-03-04 ENCOUNTER — Ambulatory Visit

## 2025-06-12 ENCOUNTER — Ambulatory Visit: Admitting: Family Medicine
